# Patient Record
Sex: Female | Born: 1940 | Race: White | Hispanic: No | Smoking: Former smoker
Health system: Southern US, Community
[De-identification: ages and names within clinical notes are randomized; demographics above are authoritative.]

## PROBLEM LIST (undated history)

## (undated) DIAGNOSIS — I10 Essential (primary) hypertension: Secondary | ICD-10-CM

## (undated) DIAGNOSIS — E785 Hyperlipidemia, unspecified: Secondary | ICD-10-CM

## (undated) DIAGNOSIS — E119 Type 2 diabetes mellitus without complications: Secondary | ICD-10-CM

## (undated) DIAGNOSIS — J439 Emphysema, unspecified: Secondary | ICD-10-CM

## (undated) HISTORY — PX: DILATION AND CURETTAGE OF UTERUS: SHX78

## (undated) HISTORY — PX: TONSILLECTOMY: SUR1361

## (undated) HISTORY — DX: Essential (primary) hypertension: I10

## (undated) HISTORY — DX: Type 2 diabetes mellitus without complications: E11.9

## (undated) HISTORY — DX: Hyperlipidemia, unspecified: E78.5

---

## 2006-03-24 ENCOUNTER — Encounter: Admission: RE | Admit: 2006-03-24 | Discharge: 2006-06-22 | Payer: Self-pay | Admitting: Orthopedic Surgery

## 2006-06-26 ENCOUNTER — Encounter: Admission: RE | Admit: 2006-06-26 | Discharge: 2006-08-30 | Payer: Self-pay | Admitting: Orthopedic Surgery

## 2008-05-23 ENCOUNTER — Encounter: Admission: RE | Admit: 2008-05-23 | Discharge: 2008-05-23 | Payer: Self-pay | Admitting: Internal Medicine

## 2009-12-02 ENCOUNTER — Encounter: Admission: RE | Admit: 2009-12-02 | Discharge: 2009-12-02 | Payer: Self-pay | Admitting: Internal Medicine

## 2011-07-01 ENCOUNTER — Other Ambulatory Visit: Payer: Self-pay | Admitting: Internal Medicine

## 2011-07-01 DIAGNOSIS — R0989 Other specified symptoms and signs involving the circulatory and respiratory systems: Secondary | ICD-10-CM

## 2011-07-06 ENCOUNTER — Ambulatory Visit
Admission: RE | Admit: 2011-07-06 | Discharge: 2011-07-06 | Disposition: A | Discharge status: Home / Self Care | Payer: Medicare Other | Source: Ambulatory Visit | Attending: Internal Medicine | Admitting: Internal Medicine

## 2011-07-06 DIAGNOSIS — R0989 Other specified symptoms and signs involving the circulatory and respiratory systems: Secondary | ICD-10-CM

## 2012-09-18 ENCOUNTER — Ambulatory Visit
Admission: RE | Admit: 2012-09-18 | Discharge: 2012-09-18 | Disposition: A | Discharge status: Home / Self Care | Payer: Medicare Other | Source: Ambulatory Visit | Attending: Cardiology | Admitting: Cardiology

## 2012-09-18 ENCOUNTER — Other Ambulatory Visit: Payer: Self-pay | Admitting: Cardiology

## 2012-09-18 DIAGNOSIS — R0602 Shortness of breath: Secondary | ICD-10-CM

## 2012-10-22 ENCOUNTER — Other Ambulatory Visit (HOSPITAL_COMMUNITY)
Admission: RE | Admit: 2012-10-22 | Discharge: 2012-10-22 | Disposition: A | Discharge status: Home / Self Care | Payer: Medicare Other | Source: Ambulatory Visit | Attending: Internal Medicine | Admitting: Internal Medicine

## 2012-10-22 DIAGNOSIS — Z124 Encounter for screening for malignant neoplasm of cervix: Secondary | ICD-10-CM | POA: Insufficient documentation

## 2013-07-10 ENCOUNTER — Encounter: Payer: Self-pay | Admitting: Podiatrist

## 2013-07-10 ENCOUNTER — Ambulatory Visit (INDEPENDENT_AMBULATORY_CARE_PROVIDER_SITE_OTHER): Payer: Medicare Other | Admitting: Podiatrist

## 2013-07-10 VITALS — BP 134/61 | HR 71 | Resp 16

## 2013-07-10 DIAGNOSIS — B351 Tinea unguium: Secondary | ICD-10-CM

## 2013-07-10 DIAGNOSIS — M79609 Pain in unspecified limb: Secondary | ICD-10-CM

## 2013-07-10 NOTE — Progress Notes (Signed)
HPI:  Patient presents today for follow up of foot and nail care. Denies any new complaints today.  Objective:  Patients chart is reviewed.  Neurovascular status unchanged.  Patients nails are thickened, discolored, distrophic, friable and brittle with yellow-brown discoloration. Patient subjectively relates they are painful with shoes and with ambulation.both feet  Assessment:  Symptomatic onychomycosis  Plan:  Discussed treatment options and alternatives.  The symptomatic toenails were debrided through manual an mechanical means without complication.  Return appointment recommended at routine intervals of 3 months   

## 2013-07-10 NOTE — Patient Instructions (Signed)
Diabetes and Foot Care Diabetes may cause you to have problems because of poor blood supply (circulation) to your feet and legs. This may cause the skin on your feet to become thinner, break easier, and heal more slowly. Your skin may become dry, and the skin may peel and crack. You may also have nerve damage in your legs and feet causing decreased feeling in them. You may not notice minor injuries to your feet that could lead to infections or more serious problems. Taking care of your feet is one of the most important things you can do for yourself.  HOME CARE INSTRUCTIONS  Wear shoes at all times, even in the house. Do not go barefoot. Bare feet are easily injured.  Check your feet daily for blisters, cuts, and redness. If you cannot see the bottom of your feet, use a mirror or ask someone for help.  Wash your feet with warm water (do not use hot water) and mild soap. Then pat your feet and the areas between your toes until they are completely dry. Do not soak your feet as this can dry your skin.  Apply a moisturizing lotion or petroleum jelly (that does not contain alcohol and is unscented) to the skin on your feet and to dry, brittle toenails. Do not apply lotion between your toes.  Trim your toenails straight across. Do not dig under them or around the cuticle. File the edges of your nails with an emery board or nail file.  Do not cut corns or calluses or try to remove them with medicine.  Wear clean socks or stockings every day. Make sure they are not too tight. Do not wear knee-high stockings since they may decrease blood flow to your legs.  Wear shoes that fit properly and have enough cushioning. To break in new shoes, wear them for just a few hours a day. This prevents you from injuring your feet. Always look in your shoes before you put them on to be sure there are no objects inside.  Do not cross your legs. This may decrease the blood flow to your feet.  If you find a minor scrape,  cut, or break in the skin on your feet, keep it and the skin around it clean and dry. These areas may be cleansed with mild soap and water. Do not cleanse the area with peroxide, alcohol, or iodine.  When you remove an adhesive bandage, be sure not to damage the skin around it.  If you have a wound, look at it several times a day to make sure it is healing.  Do not use heating pads or hot water bottles. They may burn your skin. If you have lost feeling in your feet or legs, you may not know it is happening until it is too late.  Make sure your health care provider performs a complete foot exam at least annually or more often if you have foot problems. Report any cuts, sores, or bruises to your health care provider immediately. SEEK MEDICAL CARE IF:   You have an injury that is not healing.  You have cuts or breaks in the skin.  You have an ingrown nail.  You notice redness on your legs or feet.  You feel burning or tingling in your legs or feet.  You have pain or cramps in your legs and feet.  Your legs or feet are numb.  Your feet always feel cold. SEEK IMMEDIATE MEDICAL CARE IF:   There is increasing redness,   swelling, or pain in or around a wound.  There is a red line that goes up your leg.  Pus is coming from a wound.  You develop a fever or as directed by your health care provider.  You notice a bad smell coming from an ulcer or wound. Document Released: 08/12/2000 Document Revised: 04/17/2013 Document Reviewed: 01/22/2013 [employer] Patient Information 2014 [employer].  

## 2013-10-09 ENCOUNTER — Ambulatory Visit: Payer: Medicare Other | Admitting: Podiatrist

## 2014-02-14 ENCOUNTER — Ambulatory Visit (INDEPENDENT_AMBULATORY_CARE_PROVIDER_SITE_OTHER): Payer: Medicare Other | Admitting: Podiatrist

## 2014-02-14 ENCOUNTER — Encounter: Payer: Self-pay | Admitting: Podiatrist

## 2014-02-14 VITALS — BP 110/49 | HR 83 | Resp 18

## 2014-02-14 DIAGNOSIS — M79609 Pain in unspecified limb: Secondary | ICD-10-CM

## 2014-02-14 DIAGNOSIS — M79673 Pain in unspecified foot: Secondary | ICD-10-CM

## 2014-02-14 DIAGNOSIS — B351 Tinea unguium: Secondary | ICD-10-CM

## 2014-02-14 NOTE — Progress Notes (Signed)
Just need my toenails trimmed up  HPI: Patient presents today for follow up of foot and nail care. Denies any new complaints today.  Objective: Patients chart is reviewed. Neurovascular status unchanged. Patients nails are thickened, discolored, distrophic, friable and brittle with yellow-brown discoloration. Patient subjectively relates they are painful with shoes and with ambulation.both feet  Assessment: Symptomatic onychomycosis  Plan: Discussed treatment options and alternatives. The symptomatic toenails were debrided through manual an mechanical means without complication. Return appointment recommended at routine intervals of 3 months

## 2014-05-22 ENCOUNTER — Ambulatory Visit (INDEPENDENT_AMBULATORY_CARE_PROVIDER_SITE_OTHER): Payer: Medicare Other | Admitting: Podiatrist

## 2014-05-22 ENCOUNTER — Encounter: Payer: Self-pay | Admitting: Podiatrist

## 2014-05-22 DIAGNOSIS — M79609 Pain in unspecified limb: Secondary | ICD-10-CM

## 2014-05-22 DIAGNOSIS — M79676 Pain in unspecified toe(s): Principal | ICD-10-CM

## 2014-05-22 DIAGNOSIS — B351 Tinea unguium: Secondary | ICD-10-CM

## 2014-05-26 NOTE — Progress Notes (Signed)
Just need my toenails trimmed up  HPI: Patient presents today for follow up of foot and nail care. Denies any new complaints today.  Objective: Patients chart is reviewed. Neurovascular status unchanged. Patients nails are thickened, discolored, distrophic, friable and brittle with yellow-brown discoloration. Patient subjectively relates they are painful with shoes and with ambulation.both feet  Assessment: Symptomatic onychomycosis  Plan: Discussed treatment options and alternatives. The symptomatic toenails were debrided through manual an mechanical means without complication. Return appointment recommended at routine intervals of 3 months   

## 2014-11-19 ENCOUNTER — Ambulatory Visit (INDEPENDENT_AMBULATORY_CARE_PROVIDER_SITE_OTHER): Payer: Medicare Other | Admitting: Podiatry

## 2014-11-19 ENCOUNTER — Encounter: Payer: Self-pay | Admitting: Podiatry

## 2014-11-19 VITALS — BP 129/57 | HR 74 | Resp 18

## 2014-11-19 DIAGNOSIS — M79676 Pain in unspecified toe(s): Secondary | ICD-10-CM | POA: Diagnosis not present

## 2014-11-19 DIAGNOSIS — B351 Tinea unguium: Secondary | ICD-10-CM | POA: Diagnosis not present

## 2014-11-19 DIAGNOSIS — L84 Corns and callosities: Secondary | ICD-10-CM

## 2014-11-19 NOTE — Progress Notes (Signed)
Patient ID: Isaac LaudBetty H Aikman, female   DOB: Oct 25, 1940, 74 y.o.   MRN: [id number]  Subjective: 74 y.o.-year-old female returns the office today for painful, elongated, thickened toenails and calluses for which she is unable to trim herself. Denies any redness or drainage around the nails. Denies any acute changes since last appointment and no new complaints today. Denies any systemic complaints such as fevers, chills, nausea, vomiting.   Objective: AAO 3, NAD DP/PT pulses palpable, CRT less than 3 seconds Protective sensation intact with Simms Weinstein monofilament, Achilles tendon reflex intact.  Nails hypertrophic, dystrophic, elongated, brittle, discolored 10. There is tenderness overlying the nails 1-5 bilaterally. There is no surrounding erythema or drainage along the nail sites. Bilateral submetatarsal 2 hyperkeratotic lesions. Upon debridement there is no underlying ulceration, drainage, infection. Underlying skin intact. No open lesions or other pre-ulcerative lesions are identified. No other areas of tenderness bilateral lower extremities. No overlying edema, erythema, increased warmth. No pain with calf compression, swelling, warmth, erythema.  Assessment: Patient presents with symptomatic onychomycosis, hyperkeratotic lesions 2  Plan: -Treatment options including alternatives, risks, complications were discussed -Nails sharply debrided 10 without complication/bleeding. -Hyperkeratotic lesions 2 sharply debrided without complication/bleeding. -Discussed daily foot inspection. If there are any changes, to call the office immediately.  -Follow-up in 3 months or sooner if any problems are to arise. In the meantime, encouraged to call the office with any questions, concerns, changes symptoms.

## 2015-02-23 ENCOUNTER — Encounter: Payer: Self-pay | Admitting: Podiatry

## 2015-02-23 ENCOUNTER — Ambulatory Visit (INDEPENDENT_AMBULATORY_CARE_PROVIDER_SITE_OTHER): Payer: Medicare Other | Admitting: Podiatry

## 2015-02-23 DIAGNOSIS — M79676 Pain in unspecified toe(s): Secondary | ICD-10-CM

## 2015-02-23 DIAGNOSIS — B351 Tinea unguium: Secondary | ICD-10-CM | POA: Diagnosis not present

## 2015-02-23 DIAGNOSIS — L84 Corns and callosities: Secondary | ICD-10-CM

## 2015-02-23 NOTE — Progress Notes (Signed)
Patient ID: Beth LaudBetty H Radermacher, female   DOB: 21-May-1941, 74 y.o.   MRN: [id number]  Subjective: 74 y.o. female returns the office today for painful, elongated, thickened toenails and calluses for which she is unable to trim herself. Denies any redness or drainage around the nails/calluses. Denies any acute changes since last appointment and no new complaints today. Denies any systemic complaints such as fevers, chills, nausea, vomiting.   Objective: AAO 3, NAD DP/PT pulses palpable, CRT less than 3 seconds Protective sensation intact with Simms Weinstein monofilament  Nails hypertrophic, dystrophic, elongated, brittle, discolored 10. There is tenderness overlying the nails 1-5 bilaterally. There is no surrounding erythema or drainage along the nail sites. Bilateral submetatarsal 2 hyperkeratotic lesions. Upon debridement there is no underlying ulceration, drainage, infection. Underlying skin intact. No open lesions or other pre-ulcerative lesions are identified. No other areas of tenderness bilateral lower extremities. No overlying edema, erythema, increased warmth. No pain with calf compression, swelling, warmth, erythema.  Assessment: Patient presents with symptomatic onychomycosis, hyperkeratotic lesions 2  Plan: -Treatment options including alternatives, risks, complications were discussed -Nails sharply debrided 10 without complication/bleeding. -Hyperkeratotic lesions 2 sharply debrided without complication/bleeding. -Discussed daily foot inspection. If there are any changes, to call the office immediately.  -Follow-up in 3 months or sooner if any problems are to arise. In the meantime, encouraged to call the office with any questions, concerns, changes symptoms.  Ovid CurdMatthew Jalayia Bagheri, DPM

## 2015-02-25 ENCOUNTER — Ambulatory Visit: Payer: Medicare Other | Admitting: Podiatry

## 2015-06-11 ENCOUNTER — Encounter: Payer: Self-pay | Admitting: Podiatry

## 2015-06-11 ENCOUNTER — Ambulatory Visit (INDEPENDENT_AMBULATORY_CARE_PROVIDER_SITE_OTHER): Payer: Medicare Other | Admitting: Podiatry

## 2015-06-11 DIAGNOSIS — B351 Tinea unguium: Secondary | ICD-10-CM | POA: Diagnosis not present

## 2015-06-11 DIAGNOSIS — M79676 Pain in unspecified toe(s): Secondary | ICD-10-CM

## 2015-06-11 NOTE — Progress Notes (Signed)
Patient ID: Beth Kerr, female   DOB: 02-24-1941, 74 y.o.   MRN: [id number] Complaint:  Visit Type: Patient returns to my office for continued preventative foot care services. Complaint: Patient states" my nails have grown long and thick and become painful to walk and wear shoes" Patient has been diagnosed with DM with no foot complications. The patient presents for preventative foot care services. No changes to ROS  Podiatric Exam: Vascular: dorsalis pedis and posterior tibial pulses are palpable bilateral. Capillary return is immediate. Temperature gradient is WNL. Skin turgor WNL  Sensorium: Normal Semmes Weinstein monofilament test. Normal tactile sensation bilaterally. Nail Exam: Pt has thick disfigured discolored nails with subungual debris noted bilateral entire nail hallux through fifth toenails Ulcer Exam: There is no evidence of ulcer or pre-ulcerative changes or infection. Orthopedic Exam: Muscle tone and strength are WNL. No limitations in general ROM. No crepitus or effusions noted. Foot type and digits show no abnormalities. Bony prominences are unremarkable. Skin: No Porokeratosis. No infection or ulcers  Diagnosis:  Onychomycosis, , Pain in right toe, pain in left toes  Treatment & Plan Procedures and Treatment: Consent by patient was obtained for treatment procedures. The patient understood the discussion of treatment and procedures well. All questions were answered thoroughly reviewed. Debridement of mycotic and hypertrophic toenails, 1 through 5 bilateral and clearing of subungual debris. No ulceration, no infection noted.  Return Visit-Office Procedure: Patient instructed to return to the office for a follow up visit 3 months for continued evaluation and treatment.

## 2015-09-11 ENCOUNTER — Ambulatory Visit: Payer: Medicare Other | Admitting: Podiatry

## 2015-09-16 ENCOUNTER — Ambulatory Visit (INDEPENDENT_AMBULATORY_CARE_PROVIDER_SITE_OTHER): Payer: Medicare Other | Admitting: Podiatry

## 2015-09-16 ENCOUNTER — Encounter: Payer: Self-pay | Admitting: Podiatry

## 2015-09-16 DIAGNOSIS — M79676 Pain in unspecified toe(s): Secondary | ICD-10-CM

## 2015-09-16 DIAGNOSIS — B351 Tinea unguium: Secondary | ICD-10-CM | POA: Diagnosis not present

## 2015-09-16 NOTE — Progress Notes (Signed)
Patient ID: Seara H Forand, female   DOB: 04/04/1941, 74 y.o.   MRN: [id number] Complaint:  Visit Type: Patient returns to my office for continued preventative foot care services. Complaint: Patient states" my nails have grown long and thick and become painful to walk and wear shoes" Patient has been diagnosed with DM with no foot complications. The patient presents for preventative foot care services. No changes to ROS  Podiatric Exam: Vascular: dorsalis pedis and posterior tibial pulses are palpable bilateral. Capillary return is immediate. Temperature gradient is WNL. Skin turgor WNL  Sensorium: Normal Semmes Weinstein monofilament test. Normal tactile sensation bilaterally. Nail Exam: Pt has thick disfigured discolored nails with subungual debris noted bilateral entire nail hallux through fifth toenails Ulcer Exam: There is no evidence of ulcer or pre-ulcerative changes or infection. Orthopedic Exam: Muscle tone and strength are WNL. No limitations in general ROM. No crepitus or effusions noted. Foot type and digits show no abnormalities. Bony prominences are unremarkable. Skin: No Porokeratosis. No infection or ulcers  Diagnosis:  Onychomycosis, , Pain in right toe, pain in left toes  Treatment & Plan Procedures and Treatment: Consent by patient was obtained for treatment procedures. The patient understood the discussion of treatment and procedures well. All questions were answered thoroughly reviewed. Debridement of mycotic and hypertrophic toenails, 1 through 5 bilateral and clearing of subungual debris. No ulceration, no infection noted.  Return Visit-Office Procedure: Patient instructed to return to the office for a follow up visit 3 months for continued evaluation and treatment.   Kiwana Deblasi DPM 

## 2015-09-30 DIAGNOSIS — F329 Major depressive disorder, single episode, unspecified: Secondary | ICD-10-CM | POA: Diagnosis not present

## 2015-09-30 DIAGNOSIS — I10 Essential (primary) hypertension: Secondary | ICD-10-CM | POA: Diagnosis not present

## 2015-09-30 DIAGNOSIS — E119 Type 2 diabetes mellitus without complications: Secondary | ICD-10-CM | POA: Diagnosis not present

## 2015-09-30 DIAGNOSIS — E7800 Pure hypercholesterolemia, unspecified: Secondary | ICD-10-CM | POA: Diagnosis not present

## 2015-10-05 DIAGNOSIS — J329 Chronic sinusitis, unspecified: Secondary | ICD-10-CM | POA: Diagnosis not present

## 2015-10-05 DIAGNOSIS — R05 Cough: Secondary | ICD-10-CM | POA: Diagnosis not present

## 2015-12-09 ENCOUNTER — Ambulatory Visit (INDEPENDENT_AMBULATORY_CARE_PROVIDER_SITE_OTHER): Payer: Medicare Other | Admitting: Podiatry

## 2015-12-09 ENCOUNTER — Encounter: Payer: Self-pay | Admitting: Podiatry

## 2015-12-09 DIAGNOSIS — B351 Tinea unguium: Secondary | ICD-10-CM | POA: Diagnosis not present

## 2015-12-09 DIAGNOSIS — M79676 Pain in unspecified toe(s): Secondary | ICD-10-CM | POA: Diagnosis not present

## 2015-12-09 NOTE — Progress Notes (Signed)
Patient ID: Beth Kerr, female   DOB: 10/07/1940, 74 y.o.   MRN: [id number] Complaint:  Visit Type: Patient returns to my office for continued preventative foot care services. Complaint: Patient states" my nails have grown long and thick and become painful to walk and wear shoes" Patient has been diagnosed with DM with no foot complications. The patient presents for preventative foot care services. No changes to ROS  Podiatric Exam: Vascular: dorsalis pedis and posterior tibial pulses are palpable bilateral. Capillary return is immediate. Temperature gradient is WNL. Skin turgor WNL  Sensorium: Normal Semmes Weinstein monofilament test. Normal tactile sensation bilaterally. Nail Exam: Pt has thick disfigured discolored nails with subungual debris noted bilateral entire nail hallux through fifth toenails Ulcer Exam: There is no evidence of ulcer or pre-ulcerative changes or infection. Orthopedic Exam: Muscle tone and strength are WNL. No limitations in general ROM. No crepitus or effusions noted. Foot type and digits show no abnormalities. Bony prominences are unremarkable. Skin: No Porokeratosis. No infection or ulcers  Diagnosis:  Onychomycosis, , Pain in right toe, pain in left toes  Treatment & Plan Procedures and Treatment: Consent by patient was obtained for treatment procedures. The patient understood the discussion of treatment and procedures well. All questions were answered thoroughly reviewed. Debridement of mycotic and hypertrophic toenails, 1 through 5 bilateral and clearing of subungual debris. No ulceration, no infection noted.  Return Visit-Office Procedure: Patient instructed to return to the office for a follow up visit 3 months for continued evaluation and treatment.   Tish Begin DPM 

## 2016-01-12 DIAGNOSIS — H4421 Degenerative myopia, right eye: Secondary | ICD-10-CM | POA: Diagnosis not present

## 2016-01-12 DIAGNOSIS — E119 Type 2 diabetes mellitus without complications: Secondary | ICD-10-CM | POA: Diagnosis not present

## 2016-01-12 DIAGNOSIS — H4422 Degenerative myopia, left eye: Secondary | ICD-10-CM | POA: Diagnosis not present

## 2016-01-12 DIAGNOSIS — H43813 Vitreous degeneration, bilateral: Secondary | ICD-10-CM | POA: Diagnosis not present

## 2016-01-12 DIAGNOSIS — H40009 Preglaucoma, unspecified, unspecified eye: Secondary | ICD-10-CM | POA: Diagnosis not present

## 2016-03-08 DIAGNOSIS — I6523 Occlusion and stenosis of bilateral carotid arteries: Secondary | ICD-10-CM | POA: Diagnosis not present

## 2016-03-09 ENCOUNTER — Ambulatory Visit: Payer: Medicare Other | Admitting: Podiatry

## 2016-03-18 ENCOUNTER — Ambulatory Visit (INDEPENDENT_AMBULATORY_CARE_PROVIDER_SITE_OTHER): Payer: Medicare Other | Admitting: Podiatry

## 2016-03-18 ENCOUNTER — Encounter: Payer: Self-pay | Admitting: Podiatry

## 2016-03-18 DIAGNOSIS — B351 Tinea unguium: Secondary | ICD-10-CM

## 2016-03-18 DIAGNOSIS — M79676 Pain in unspecified toe(s): Secondary | ICD-10-CM

## 2016-03-18 NOTE — Progress Notes (Signed)
Patient ID: Beth Kerr, female   DOB: August 29, 1941, 75 y.o.   MRN: [id number] Complaint:  Visit Type: Patient returns to my office for continued preventative foot care services. Complaint: Patient states" my nails have grown long and thick and become painful to walk and wear shoes" Patient has been diagnosed with DM with no foot complications. The patient presents for preventative foot care services. No changes to ROS  Podiatric Exam: Vascular: dorsalis pedis and posterior tibial pulses are palpable bilateral. Capillary return is immediate. Temperature gradient is WNL. Skin turgor WNL  Sensorium: Normal Semmes Weinstein monofilament test. Normal tactile sensation bilaterally. Nail Exam: Pt has thick disfigured discolored nails with subungual debris noted bilateral entire nail hallux through fifth toenails Ulcer Exam: There is no evidence of ulcer or pre-ulcerative changes or infection. Orthopedic Exam: Muscle tone and strength are WNL. No limitations in general ROM. No crepitus or effusions noted. Foot type and digits show no abnormalities. Bony prominences are unremarkable. Skin: No Porokeratosis. No infection or ulcers  Diagnosis:  Onychomycosis, , Pain in right toe, pain in left toes  Treatment & Plan Procedures and Treatment: Consent by patient was obtained for treatment procedures. The patient understood the discussion of treatment and procedures well. All questions were answered thoroughly reviewed. Debridement of mycotic and hypertrophic toenails, 1 through 5 bilateral and clearing of subungual debris. No ulceration, no infection noted.  Return Visit-Office Procedure: Patient instructed to return to the office for a follow up visit 3 months for continued evaluation and treatment.   Helane GuntherGregory Amoni Morales DPM

## 2016-04-01 DIAGNOSIS — E119 Type 2 diabetes mellitus without complications: Secondary | ICD-10-CM | POA: Diagnosis not present

## 2016-04-01 DIAGNOSIS — M79641 Pain in right hand: Secondary | ICD-10-CM | POA: Diagnosis not present

## 2016-04-01 DIAGNOSIS — E785 Hyperlipidemia, unspecified: Secondary | ICD-10-CM | POA: Diagnosis not present

## 2016-04-01 DIAGNOSIS — I10 Essential (primary) hypertension: Secondary | ICD-10-CM | POA: Diagnosis not present

## 2016-04-01 DIAGNOSIS — M79642 Pain in left hand: Secondary | ICD-10-CM | POA: Diagnosis not present

## 2016-04-01 DIAGNOSIS — Z0001 Encounter for general adult medical examination with abnormal findings: Secondary | ICD-10-CM | POA: Diagnosis not present

## 2016-04-01 DIAGNOSIS — F329 Major depressive disorder, single episode, unspecified: Secondary | ICD-10-CM | POA: Diagnosis not present

## 2016-04-01 DIAGNOSIS — M810 Age-related osteoporosis without current pathological fracture: Secondary | ICD-10-CM | POA: Diagnosis not present

## 2016-05-12 DIAGNOSIS — J029 Acute pharyngitis, unspecified: Secondary | ICD-10-CM | POA: Diagnosis not present

## 2016-05-12 DIAGNOSIS — J069 Acute upper respiratory infection, unspecified: Secondary | ICD-10-CM | POA: Diagnosis not present

## 2016-05-12 DIAGNOSIS — H9203 Otalgia, bilateral: Secondary | ICD-10-CM | POA: Diagnosis not present

## 2016-05-12 DIAGNOSIS — Z23 Encounter for immunization: Secondary | ICD-10-CM | POA: Diagnosis not present

## 2016-05-19 ENCOUNTER — Emergency Department (HOSPITAL_COMMUNITY)
Admission: EM | Admit: 2016-05-19 | Discharge: 2016-05-19 | Disposition: A | Discharge status: Home / Self Care | Payer: Worker's Compensation | Attending: Emergency Medicine | Admitting: Emergency Medicine

## 2016-05-19 ENCOUNTER — Encounter (HOSPITAL_COMMUNITY): Payer: Self-pay

## 2016-05-19 ENCOUNTER — Emergency Department (HOSPITAL_COMMUNITY): Payer: Worker's Compensation

## 2016-05-19 DIAGNOSIS — Y929 Unspecified place or not applicable: Secondary | ICD-10-CM | POA: Insufficient documentation

## 2016-05-19 DIAGNOSIS — Z7984 Long term (current) use of oral hypoglycemic drugs: Secondary | ICD-10-CM | POA: Diagnosis not present

## 2016-05-19 DIAGNOSIS — S7001XA Contusion of right hip, initial encounter: Secondary | ICD-10-CM | POA: Diagnosis not present

## 2016-05-19 DIAGNOSIS — S93401A Sprain of unspecified ligament of right ankle, initial encounter: Secondary | ICD-10-CM | POA: Diagnosis not present

## 2016-05-19 DIAGNOSIS — E119 Type 2 diabetes mellitus without complications: Secondary | ICD-10-CM | POA: Insufficient documentation

## 2016-05-19 DIAGNOSIS — Z79899 Other long term (current) drug therapy: Secondary | ICD-10-CM | POA: Diagnosis not present

## 2016-05-19 DIAGNOSIS — Y939 Activity, unspecified: Secondary | ICD-10-CM | POA: Diagnosis not present

## 2016-05-19 DIAGNOSIS — W010XXA Fall on same level from slipping, tripping and stumbling without subsequent striking against object, initial encounter: Secondary | ICD-10-CM | POA: Diagnosis not present

## 2016-05-19 DIAGNOSIS — I10 Essential (primary) hypertension: Secondary | ICD-10-CM | POA: Diagnosis not present

## 2016-05-19 DIAGNOSIS — S99911A Unspecified injury of right ankle, initial encounter: Secondary | ICD-10-CM | POA: Diagnosis present

## 2016-05-19 DIAGNOSIS — S40021A Contusion of right upper arm, initial encounter: Secondary | ICD-10-CM

## 2016-05-19 DIAGNOSIS — Z87891 Personal history of nicotine dependence: Secondary | ICD-10-CM | POA: Insufficient documentation

## 2016-05-19 DIAGNOSIS — Y990 Civilian activity done for income or pay: Secondary | ICD-10-CM | POA: Insufficient documentation

## 2016-05-19 DIAGNOSIS — Z7982 Long term (current) use of aspirin: Secondary | ICD-10-CM | POA: Diagnosis not present

## 2016-05-19 DIAGNOSIS — W19XXXA Unspecified fall, initial encounter: Secondary | ICD-10-CM

## 2016-05-19 DIAGNOSIS — S80212A Abrasion, left knee, initial encounter: Secondary | ICD-10-CM | POA: Diagnosis not present

## 2016-05-19 HISTORY — DX: Emphysema, unspecified: J43.9

## 2016-05-19 NOTE — ED Notes (Signed)
DC instructions given by Gabriel RungAdrian RN

## 2016-05-19 NOTE — ED Notes (Signed)
Pt assessed and discharged by Roselyn BeringJ. Knapp EDP.  Pt was waiting for their d/c instruction in Triage area.  Pt did not sign

## 2016-05-19 NOTE — Discharge Instructions (Signed)
Apply ice to tender areas, tylenol or advil as needed for pain, follow up with a primary care doctor, orthopedic doctor, or occupational health doctor if sx are persisting into next week

## 2016-05-19 NOTE — ED Provider Notes (Signed)
[facility]-EMERGENCY DEPT Provider Note   CSN: [id number] Arrival date & time: 05/19/16  1106     History   Chief Complaint Chief Complaint  Patient presents with  . Fall  . Ankle Pain  . Axilla pain  . Knee Pain    HPI Beth Kerr is a 75 y.o. female.  HPI Pt was at work this am.  She tripped over a step stool on the floor and fell to the ground.  Pt is having soreness in the right axillary area.  She was also having some pain in her right foot, ankle area and right hip.  SHe has been able to walk.  No LOC.  No other complaints.  Past Medical History:  Diagnosis Date  . Diabetes mellitus without complication (HCC)   . Emphysema/COPD (HCC)   . Hyperlipidemia   . Hypertension     There are no active problems to display for this patient.   Past Surgical History:  Procedure Laterality Date  . DILATION AND CURETTAGE OF UTERUS    . TONSILLECTOMY      OB History    No data available       Home Medications    Prior to Admission medications   Medication Sig Start Date End Date Taking? Authorizing Provider  aspirin EC 81 MG tablet Take 81 mg by mouth every morning.    Yes Historical Provider, MD  CRESTOR 10 MG tablet Take 10 mg by mouth every evening.  09/16/14  Yes Historical Provider, MD  losartan-hydrochlorothiazide (HYZAAR) 100-12.5 MG per tablet Take 1 tablet by mouth every morning.  11/12/14  Yes Historical Provider, MD  metFORMIN (GLUCOPHAGE) 1000 MG tablet Take 1,000 mg by mouth 2 (two) times daily with a meal.  10/18/14  Yes Historical Provider, MD    Family History History reviewed. No pertinent family history.  Social History Social History  Substance Use Topics  . Smoking status: Former Games developer  . Smokeless tobacco: Never Used  . Alcohol use No     Allergies   Demerol [meperidine]   Review of Systems Review of Systems   Physical Exam Updated Vital Signs BP (!) 149/51 (BP Location: Left Arm)   Pulse 80   Temp 98.4 F (36.9 C) (Oral)    Resp 15   SpO2 90%   Physical Exam  Constitutional: She appears well-developed and well-nourished. No distress.  HENT:  Head: Normocephalic and atraumatic.  Right Ear: External ear normal.  Left Ear: External ear normal.  Eyes: Conjunctivae are normal. Right eye exhibits no discharge. Left eye exhibits no discharge. No scleral icterus.  Neck: Neck supple. No tracheal deviation present.  Cardiovascular: Normal rate.   Pulmonary/Chest: Effort normal. No stridor. No respiratory distress.  Abdominal: She exhibits no distension.  Musculoskeletal: She exhibits no edema.       Right shoulder: She exhibits no tenderness, no bony tenderness and no swelling.       Left shoulder: She exhibits no tenderness, no bony tenderness and no swelling.       Right wrist: She exhibits no tenderness, no bony tenderness and no swelling.       Left wrist: She exhibits no tenderness, no bony tenderness and no swelling.       Right hip: She exhibits normal range of motion, no tenderness, no bony tenderness and no swelling.       Left hip: She exhibits normal range of motion, no tenderness and no bony tenderness.       Left  knee: She exhibits no swelling (small abrasion), no effusion, no deformity and no laceration.       Right ankle: She exhibits normal range of motion, no swelling and no deformity. Tenderness (mild). No lateral malleolus and no medial malleolus tenderness found.       Left ankle: She exhibits no swelling. No tenderness.       Cervical back: She exhibits no tenderness, no bony tenderness and no swelling.       Thoracic back: She exhibits no tenderness, no bony tenderness and no swelling.       Lumbar back: She exhibits no tenderness, no bony tenderness and no swelling.  Neurological: She is alert. Cranial nerve deficit: no gross deficits.  Skin: Skin is warm and dry. No rash noted.  Psychiatric: She has a normal mood and affect.  Nursing note and vitals reviewed.    ED Treatments / Results    Labs (all labs ordered are listed, but only abnormal results are displayed) Labs Reviewed - No data to display  Radiology Dg Shoulder Right  Result Date: 05/19/2016 CLINICAL DATA:  Recent fall with right shoulder pain in the anterior aspect and axilla region. EXAM: RIGHT SHOULDER - 2+ VIEW COMPARISON:  Chest radiograph 09/18/2012 FINDINGS: Right shoulder is located without fracture. Right AC joint is intact. Visualized right ribs are intact. IMPRESSION: No acute abnormality. Electronically Signed   By: Richarda OverlieAdam  Henn M.D.   On: 05/19/2016 12:31   Dg Ankle Complete Right  Result Date: 05/19/2016 CLINICAL DATA:  Fall today with ankle pain, initial encounter EXAM: RIGHT ANKLE - COMPLETE 3+ VIEW COMPARISON:  None. FINDINGS: There is no evidence of fracture, dislocation, or joint effusion. There is no evidence of arthropathy or other focal bone abnormality. Soft tissues are unremarkable. IMPRESSION: No acute abnormality noted. Electronically Signed   By: Alcide CleverMark  Lukens M.D.   On: 05/19/2016 12:31   Dg Knee Complete 4 Views Left  Result Date: 05/19/2016 CLINICAL DATA:  Fall today with left knee pain, initial encounter EXAM: LEFT KNEE - COMPLETE 4+ VIEW COMPARISON:  None. FINDINGS: Mild patellofemoral degenerative changes are noted. No joint effusion is seen. No acute fracture or dislocation is noted. No soft tissue abnormality is seen. IMPRESSION: No acute abnormality noted. Mild patellofemoral degenerative change. Electronically Signed   By: Alcide CleverMark  Lukens M.D.   On: 05/19/2016 12:31   Dg Hip Unilat  With Pelvis 2-3 Views Right  Result Date: 05/19/2016 CLINICAL DATA:  Recent fall today with right hip pain, initial encounter EXAM: DG HIP (WITH OR WITHOUT PELVIS) 2-3V RIGHT COMPARISON:  None. FINDINGS: The pelvic ring is intact. The proximal femur is within normal limits. No fracture or dislocation is seen. No soft tissue abnormality is noted. IMPRESSION: No acute abnormality seen. Electronically Signed   By:  Alcide CleverMark  Lukens M.D.   On: 05/19/2016 12:32    Procedures Procedures (including critical care time)  Medications Ordered in ED Medications - No data to display   Initial Impression / Assessment and Plan / ED Course  I have reviewed the triage vital signs and the nursing notes.  Pertinent labs & imaging results that were available during my care of the patient were reviewed by me and considered in my medical decision making (see chart for details).  Clinical Course    Xrays negative.  No fx or dislocation.  Dc home.  OTC pani meds prn.  Follow  Up with ortho, or occ health as needed  Final Clinical Impressions(s) / ED Diagnoses  Final diagnoses:  Fall, initial encounter  Ankle sprain, right, initial encounter  Contusion of right hip, initial encounter  Contusion of axillary region, right, initial encounter    New Prescriptions New Prescriptions   No medications on file     Linwood Dibbles, MD 05/19/16 1610

## 2016-05-19 NOTE — ED Triage Notes (Signed)
Pt c/o L knee, R ankle, R hip. and R axilla pain after falling off a step stool at work this morning.  Pain score 2/10.  Denies LOC.  Pt was ambulatory after fall.

## 2016-06-24 ENCOUNTER — Encounter: Payer: Self-pay | Admitting: Podiatry

## 2016-06-24 ENCOUNTER — Ambulatory Visit (INDEPENDENT_AMBULATORY_CARE_PROVIDER_SITE_OTHER): Payer: Medicare Other | Admitting: Podiatry

## 2016-06-24 DIAGNOSIS — M79676 Pain in unspecified toe(s): Secondary | ICD-10-CM

## 2016-06-24 DIAGNOSIS — B351 Tinea unguium: Secondary | ICD-10-CM

## 2016-06-24 NOTE — Progress Notes (Signed)
Patient ID: Beth Kerr, female   DOB: 08/29/1941, 74 y.o.   MRN: [id number] Complaint:  Visit Type: Patient returns to my office for continued preventative foot care services. Complaint: Patient states" my nails have grown long and thick and become painful to walk and wear shoes" Patient has been diagnosed with DM with no foot complications. The patient presents for preventative foot care services. No changes to ROS  Podiatric Exam: Vascular: dorsalis pedis and posterior tibial pulses are palpable bilateral. Capillary return is immediate. Temperature gradient is WNL. Skin turgor WNL  Sensorium: Normal Semmes Weinstein monofilament test. Normal tactile sensation bilaterally. Nail Exam: Pt has thick disfigured discolored nails with subungual debris noted bilateral entire nail hallux through fifth toenails Ulcer Exam: There is no evidence of ulcer or pre-ulcerative changes or infection. Orthopedic Exam: Muscle tone and strength are WNL. No limitations in general ROM. No crepitus or effusions noted. Foot type and digits show no abnormalities. Bony prominences are unremarkable. Skin: No Porokeratosis. No infection or ulcers  Diagnosis:  Onychomycosis, , Pain in right toe, pain in left toes  Treatment & Plan Procedures and Treatment: Consent by patient was obtained for treatment procedures. The patient understood the discussion of treatment and procedures well. All questions were answered thoroughly reviewed. Debridement of mycotic and hypertrophic toenails, 1 through 5 bilateral and clearing of subungual debris. No ulceration, no infection noted.  Return Visit-Office Procedure: Patient instructed to return to the office for a follow up visit 3 months for continued evaluation and treatment.   Betzabeth Derringer DPM 

## 2016-06-27 DIAGNOSIS — R0600 Dyspnea, unspecified: Secondary | ICD-10-CM | POA: Diagnosis not present

## 2016-06-28 ENCOUNTER — Other Ambulatory Visit (HOSPITAL_COMMUNITY): Payer: Self-pay | Admitting: Respiratory Therapy

## 2016-06-28 DIAGNOSIS — R0600 Dyspnea, unspecified: Secondary | ICD-10-CM

## 2016-06-30 ENCOUNTER — Ambulatory Visit (HOSPITAL_COMMUNITY)
Admission: RE | Admit: 2016-06-30 | Discharge: 2016-06-30 | Disposition: A | Discharge status: Home / Self Care | Payer: Medicare Other | Source: Ambulatory Visit | Attending: Internal Medicine | Admitting: Internal Medicine

## 2016-06-30 DIAGNOSIS — R0600 Dyspnea, unspecified: Secondary | ICD-10-CM | POA: Insufficient documentation

## 2016-06-30 LAB — PULMONARY FUNCTION TEST
DL/VA % pred: 81 %
DL/VA: 3.57 ml/min/mmHg/L
DLCO UNC % PRED: 62 %
DLCO UNC: 12.64 ml/min/mmHg
FEF 25-75 PRE: 0.24 L/s
FEF 25-75 Post: 0.32 L/sec
FEF2575-%Change-Post: 29 %
FEF2575-%PRED-POST: 22 %
FEF2575-%Pred-Pre: 17 %
FEV1-%Change-Post: 5 %
FEV1-%PRED-POST: 45 %
FEV1-%Pred-Pre: 43 %
FEV1-POST: 0.81 L
FEV1-Pre: 0.77 L
FEV1FVC-%Change-Post: -8 %
FEV1FVC-%Pred-Pre: 64 %
FEV6-%CHANGE-POST: 11 %
FEV6-%PRED-POST: 69 %
FEV6-%Pred-Pre: 62 %
FEV6-PRE: 1.41 L
FEV6-Post: 1.56 L
FEV6FVC-%CHANGE-POST: -2 %
FEV6FVC-%PRED-PRE: 91 %
FEV6FVC-%Pred-Post: 89 %
FVC-%Change-Post: 14 %
FVC-%PRED-POST: 77 %
FVC-%Pred-Pre: 67 %
FVC-Post: 1.84 L
FVC-Pre: 1.61 L
POST FEV1/FVC RATIO: 44 %
Post FEV6/FVC ratio: 85 %
Pre FEV1/FVC ratio: 48 %
Pre FEV6/FVC Ratio: 87 %
RV % PRED: 155 %
RV: 3.38 L
TLC % pred: 118 %
TLC: 5.43 L

## 2016-06-30 MED ORDER — ALBUTEROL SULFATE (2.5 MG/3ML) 0.083% IN NEBU
2.5000 mg | INHALATION_SOLUTION | Freq: Once | RESPIRATORY_TRACT | Status: AC
  Administered 2016-06-30: 2.5 mg via RESPIRATORY_TRACT

## 2016-09-06 DIAGNOSIS — I6523 Occlusion and stenosis of bilateral carotid arteries: Secondary | ICD-10-CM | POA: Diagnosis not present

## 2016-09-23 ENCOUNTER — Ambulatory Visit: Payer: Medicare Other | Admitting: Podiatry

## 2016-09-28 DIAGNOSIS — E119 Type 2 diabetes mellitus without complications: Secondary | ICD-10-CM | POA: Diagnosis not present

## 2016-09-28 DIAGNOSIS — I10 Essential (primary) hypertension: Secondary | ICD-10-CM | POA: Diagnosis not present

## 2016-10-03 DIAGNOSIS — Z0000 Encounter for general adult medical examination without abnormal findings: Secondary | ICD-10-CM | POA: Diagnosis not present

## 2016-10-03 DIAGNOSIS — I10 Essential (primary) hypertension: Secondary | ICD-10-CM | POA: Diagnosis not present

## 2016-10-03 DIAGNOSIS — E119 Type 2 diabetes mellitus without complications: Secondary | ICD-10-CM | POA: Diagnosis not present

## 2016-10-03 DIAGNOSIS — E785 Hyperlipidemia, unspecified: Secondary | ICD-10-CM | POA: Diagnosis not present

## 2016-10-03 DIAGNOSIS — I6529 Occlusion and stenosis of unspecified carotid artery: Secondary | ICD-10-CM | POA: Diagnosis not present

## 2016-10-11 DIAGNOSIS — I6523 Occlusion and stenosis of bilateral carotid arteries: Secondary | ICD-10-CM | POA: Diagnosis not present

## 2016-10-11 DIAGNOSIS — I10 Essential (primary) hypertension: Secondary | ICD-10-CM | POA: Diagnosis not present

## 2016-10-11 DIAGNOSIS — E119 Type 2 diabetes mellitus without complications: Secondary | ICD-10-CM | POA: Diagnosis not present

## 2016-10-11 DIAGNOSIS — E7800 Pure hypercholesterolemia, unspecified: Secondary | ICD-10-CM | POA: Diagnosis not present

## 2016-10-25 DIAGNOSIS — I10 Essential (primary) hypertension: Secondary | ICD-10-CM | POA: Diagnosis not present

## 2016-11-22 ENCOUNTER — Ambulatory Visit (INDEPENDENT_AMBULATORY_CARE_PROVIDER_SITE_OTHER): Payer: Medicare Other | Admitting: Podiatry

## 2016-11-22 ENCOUNTER — Encounter: Payer: Self-pay | Admitting: Podiatry

## 2016-11-22 DIAGNOSIS — M79676 Pain in unspecified toe(s): Secondary | ICD-10-CM

## 2016-11-22 DIAGNOSIS — B351 Tinea unguium: Secondary | ICD-10-CM

## 2016-11-22 NOTE — Progress Notes (Signed)
Patient ID: Beth LaudBetty H Hearty, female   DOB: 06/09/1941, 76 y.o.   MRN: [id number] Complaint:  Visit Type: Patient returns to my office for continued preventative foot care services. Complaint: Patient states" my nails have grown long and thick and become painful to walk and wear shoes" Patient has been diagnosed with DM with no foot complications. The patient presents for preventative foot care services. No changes to ROS  Podiatric Exam: Vascular: dorsalis pedis and posterior tibial pulses are palpable bilateral. Capillary return is immediate. Temperature gradient is WNL. Skin turgor WNL  Sensorium: Normal Semmes Weinstein monofilament test. Normal tactile sensation bilaterally. Nail Exam: Pt has thick disfigured discolored nails with subungual debris noted bilateral entire nail hallux through fifth toenails Ulcer Exam: There is no evidence of ulcer or pre-ulcerative changes or infection. Orthopedic Exam: Muscle tone and strength are WNL. No limitations in general ROM. No crepitus or effusions noted. Foot type and digits show no abnormalities. Bony prominences are unremarkable. Skin: No Porokeratosis. No infection or ulcers  Diagnosis:  Onychomycosis, , Pain in right toe, pain in left toes  Treatment & Plan Procedures and Treatment: Consent by patient was obtained for treatment procedures. The patient understood the discussion of treatment and procedures well. All questions were answered thoroughly reviewed. Debridement of mycotic and hypertrophic toenails, 1 through 5 bilateral and clearing of subungual debris. No ulceration, no infection noted.  Return Visit-Office Procedure: Patient instructed to return to the office for a follow up visit 3 months for continued evaluation and treatment.   Helane GuntherGregory Vendela Troung DPM

## 2016-11-23 DIAGNOSIS — I10 Essential (primary) hypertension: Secondary | ICD-10-CM | POA: Diagnosis not present

## 2016-12-28 DIAGNOSIS — I10 Essential (primary) hypertension: Secondary | ICD-10-CM | POA: Diagnosis not present

## 2017-01-09 DIAGNOSIS — H43813 Vitreous degeneration, bilateral: Secondary | ICD-10-CM | POA: Diagnosis not present

## 2017-01-09 DIAGNOSIS — H353131 Nonexudative age-related macular degeneration, bilateral, early dry stage: Secondary | ICD-10-CM | POA: Diagnosis not present

## 2017-01-09 DIAGNOSIS — H40009 Preglaucoma, unspecified, unspecified eye: Secondary | ICD-10-CM | POA: Diagnosis not present

## 2017-01-09 DIAGNOSIS — E119 Type 2 diabetes mellitus without complications: Secondary | ICD-10-CM | POA: Diagnosis not present

## 2017-01-27 ENCOUNTER — Emergency Department (HOSPITAL_COMMUNITY): Payer: Medicare Other

## 2017-01-27 ENCOUNTER — Inpatient Hospital Stay (HOSPITAL_COMMUNITY)
Admission: EM | Admit: 2017-01-27 | Discharge: 2017-02-03 | DRG: 208 | Disposition: A | Discharge status: Skilled Nursing Facility | Payer: Medicare Other | Attending: Internal Medicine | Admitting: Internal Medicine

## 2017-01-27 ENCOUNTER — Encounter (HOSPITAL_COMMUNITY): Payer: Self-pay | Admitting: *Deleted

## 2017-01-27 DIAGNOSIS — M6282 Rhabdomyolysis: Secondary | ICD-10-CM | POA: Diagnosis not present

## 2017-01-27 DIAGNOSIS — E1110 Type 2 diabetes mellitus with ketoacidosis without coma: Secondary | ICD-10-CM

## 2017-01-27 DIAGNOSIS — E870 Hyperosmolality and hypernatremia: Secondary | ICD-10-CM | POA: Diagnosis present

## 2017-01-27 DIAGNOSIS — S0990XA Unspecified injury of head, initial encounter: Secondary | ICD-10-CM | POA: Diagnosis not present

## 2017-01-27 DIAGNOSIS — Z4682 Encounter for fitting and adjustment of non-vascular catheter: Secondary | ICD-10-CM | POA: Diagnosis not present

## 2017-01-27 DIAGNOSIS — R278 Other lack of coordination: Secondary | ICD-10-CM | POA: Diagnosis not present

## 2017-01-27 DIAGNOSIS — I5031 Acute diastolic (congestive) heart failure: Secondary | ICD-10-CM | POA: Diagnosis not present

## 2017-01-27 DIAGNOSIS — R0781 Pleurodynia: Secondary | ICD-10-CM

## 2017-01-27 DIAGNOSIS — E860 Dehydration: Secondary | ICD-10-CM | POA: Diagnosis present

## 2017-01-27 DIAGNOSIS — Z6828 Body mass index (BMI) 28.0-28.9, adult: Secondary | ICD-10-CM

## 2017-01-27 DIAGNOSIS — R0902 Hypoxemia: Secondary | ICD-10-CM

## 2017-01-27 DIAGNOSIS — I248 Other forms of acute ischemic heart disease: Secondary | ICD-10-CM | POA: Diagnosis not present

## 2017-01-27 DIAGNOSIS — R000 Tachycardia, unspecified: Secondary | ICD-10-CM | POA: Diagnosis not present

## 2017-01-27 DIAGNOSIS — J690 Pneumonitis due to inhalation of food and vomit: Secondary | ICD-10-CM | POA: Diagnosis not present

## 2017-01-27 DIAGNOSIS — E785 Hyperlipidemia, unspecified: Secondary | ICD-10-CM | POA: Diagnosis present

## 2017-01-27 DIAGNOSIS — E440 Moderate protein-calorie malnutrition: Secondary | ICD-10-CM | POA: Diagnosis not present

## 2017-01-27 DIAGNOSIS — Z87891 Personal history of nicotine dependence: Secondary | ICD-10-CM

## 2017-01-27 DIAGNOSIS — I5033 Acute on chronic diastolic (congestive) heart failure: Secondary | ICD-10-CM | POA: Diagnosis not present

## 2017-01-27 DIAGNOSIS — R0602 Shortness of breath: Secondary | ICD-10-CM | POA: Diagnosis not present

## 2017-01-27 DIAGNOSIS — Z978 Presence of other specified devices: Secondary | ICD-10-CM

## 2017-01-27 DIAGNOSIS — W19XXXA Unspecified fall, initial encounter: Secondary | ICD-10-CM | POA: Diagnosis present

## 2017-01-27 DIAGNOSIS — Z7984 Long term (current) use of oral hypoglycemic drugs: Secondary | ICD-10-CM | POA: Diagnosis not present

## 2017-01-27 DIAGNOSIS — Z7982 Long term (current) use of aspirin: Secondary | ICD-10-CM

## 2017-01-27 DIAGNOSIS — D72829 Elevated white blood cell count, unspecified: Secondary | ICD-10-CM

## 2017-01-27 DIAGNOSIS — R55 Syncope and collapse: Secondary | ICD-10-CM

## 2017-01-27 DIAGNOSIS — I110 Hypertensive heart disease with heart failure: Secondary | ICD-10-CM | POA: Diagnosis present

## 2017-01-27 DIAGNOSIS — R41841 Cognitive communication deficit: Secondary | ICD-10-CM | POA: Diagnosis not present

## 2017-01-27 DIAGNOSIS — G9340 Encephalopathy, unspecified: Secondary | ICD-10-CM | POA: Diagnosis not present

## 2017-01-27 DIAGNOSIS — J9602 Acute respiratory failure with hypercapnia: Secondary | ICD-10-CM | POA: Diagnosis not present

## 2017-01-27 DIAGNOSIS — N200 Calculus of kidney: Secondary | ICD-10-CM | POA: Diagnosis not present

## 2017-01-27 DIAGNOSIS — J441 Chronic obstructive pulmonary disease with (acute) exacerbation: Secondary | ICD-10-CM | POA: Diagnosis not present

## 2017-01-27 DIAGNOSIS — R0989 Other specified symptoms and signs involving the circulatory and respiratory systems: Secondary | ICD-10-CM | POA: Diagnosis not present

## 2017-01-27 DIAGNOSIS — R748 Abnormal levels of other serum enzymes: Secondary | ICD-10-CM

## 2017-01-27 DIAGNOSIS — R413 Other amnesia: Secondary | ICD-10-CM | POA: Diagnosis present

## 2017-01-27 DIAGNOSIS — J90 Pleural effusion, not elsewhere classified: Secondary | ICD-10-CM | POA: Diagnosis not present

## 2017-01-27 DIAGNOSIS — J9811 Atelectasis: Secondary | ICD-10-CM | POA: Diagnosis not present

## 2017-01-27 DIAGNOSIS — E1129 Type 2 diabetes mellitus with other diabetic kidney complication: Secondary | ICD-10-CM | POA: Diagnosis present

## 2017-01-27 DIAGNOSIS — M6281 Muscle weakness (generalized): Secondary | ICD-10-CM | POA: Diagnosis not present

## 2017-01-27 DIAGNOSIS — N179 Acute kidney failure, unspecified: Secondary | ICD-10-CM | POA: Diagnosis present

## 2017-01-27 DIAGNOSIS — Z79899 Other long term (current) drug therapy: Secondary | ICD-10-CM | POA: Diagnosis not present

## 2017-01-27 DIAGNOSIS — G9341 Metabolic encephalopathy: Secondary | ICD-10-CM | POA: Diagnosis present

## 2017-01-27 DIAGNOSIS — E2740 Unspecified adrenocortical insufficiency: Secondary | ICD-10-CM | POA: Diagnosis present

## 2017-01-27 DIAGNOSIS — R579 Shock, unspecified: Secondary | ICD-10-CM | POA: Diagnosis not present

## 2017-01-27 DIAGNOSIS — R1312 Dysphagia, oropharyngeal phase: Secondary | ICD-10-CM | POA: Diagnosis not present

## 2017-01-27 DIAGNOSIS — D539 Nutritional anemia, unspecified: Secondary | ICD-10-CM | POA: Diagnosis present

## 2017-01-27 DIAGNOSIS — J449 Chronic obstructive pulmonary disease, unspecified: Secondary | ICD-10-CM | POA: Diagnosis not present

## 2017-01-27 DIAGNOSIS — R778 Other specified abnormalities of plasma proteins: Secondary | ICD-10-CM | POA: Diagnosis present

## 2017-01-27 DIAGNOSIS — R4182 Altered mental status, unspecified: Secondary | ICD-10-CM

## 2017-01-27 DIAGNOSIS — R2681 Unsteadiness on feet: Secondary | ICD-10-CM | POA: Diagnosis not present

## 2017-01-27 DIAGNOSIS — R7989 Other specified abnormal findings of blood chemistry: Secondary | ICD-10-CM | POA: Diagnosis not present

## 2017-01-27 DIAGNOSIS — Z4659 Encounter for fitting and adjustment of other gastrointestinal appliance and device: Secondary | ICD-10-CM

## 2017-01-27 DIAGNOSIS — Y92009 Unspecified place in unspecified non-institutional (private) residence as the place of occurrence of the external cause: Secondary | ICD-10-CM

## 2017-01-27 DIAGNOSIS — E1165 Type 2 diabetes mellitus with hyperglycemia: Secondary | ICD-10-CM | POA: Diagnosis present

## 2017-01-27 DIAGNOSIS — J9601 Acute respiratory failure with hypoxia: Secondary | ICD-10-CM | POA: Diagnosis present

## 2017-01-27 DIAGNOSIS — E872 Acidosis: Secondary | ICD-10-CM | POA: Diagnosis present

## 2017-01-27 DIAGNOSIS — R319 Hematuria, unspecified: Secondary | ICD-10-CM

## 2017-01-27 DIAGNOSIS — R2689 Other abnormalities of gait and mobility: Secondary | ICD-10-CM | POA: Diagnosis not present

## 2017-01-27 DIAGNOSIS — R079 Chest pain, unspecified: Secondary | ICD-10-CM | POA: Diagnosis not present

## 2017-01-27 HISTORY — DX: Rhabdomyolysis: M62.82

## 2017-01-27 LAB — CBC WITH DIFFERENTIAL/PLATELET
BASOS ABS: 0 10*3/uL (ref 0.0–0.1)
BASOS PCT: 0 %
Eosinophils Absolute: 0 10*3/uL (ref 0.0–0.7)
Eosinophils Relative: 0 %
HEMATOCRIT: 43.9 % (ref 36.0–46.0)
HEMOGLOBIN: 13.7 g/dL (ref 12.0–15.0)
LYMPHS PCT: 6 %
Lymphs Abs: 0.8 10*3/uL (ref 0.7–4.0)
MCH: 31.9 pg (ref 26.0–34.0)
MCHC: 31.2 g/dL (ref 30.0–36.0)
MCV: 102.1 fL — AB (ref 78.0–100.0)
MONO ABS: 0.7 10*3/uL (ref 0.1–1.0)
Monocytes Relative: 5 %
NEUTROS ABS: 11.5 10*3/uL — AB (ref 1.7–7.7)
NEUTROS PCT: 89 %
Platelets: 209 10*3/uL (ref 150–400)
RBC: 4.3 MIL/uL (ref 3.87–5.11)
RDW: 14.6 % (ref 11.5–15.5)
WBC: 13 10*3/uL — ABNORMAL HIGH (ref 4.0–10.5)

## 2017-01-27 LAB — COMPREHENSIVE METABOLIC PANEL
ALT: 19 U/L (ref 14–54)
AST: 41 U/L (ref 15–41)
Albumin: 3.8 g/dL (ref 3.5–5.0)
Alkaline Phosphatase: 43 U/L (ref 38–126)
Anion gap: 13 (ref 5–15)
BUN: 48 mg/dL — ABNORMAL HIGH (ref 6–20)
CHLORIDE: 102 mmol/L (ref 101–111)
CO2: 31 mmol/L (ref 22–32)
CREATININE: 1.12 mg/dL — AB (ref 0.44–1.00)
Calcium: 8.7 mg/dL — ABNORMAL LOW (ref 8.9–10.3)
GFR calc non Af Amer: 46 mL/min — ABNORMAL LOW (ref >60)
GFR, EST AFRICAN AMERICAN: 53 mL/min — AB (ref >60)
Glucose, Bld: 198 mg/dL — ABNORMAL HIGH (ref 65–99)
POTASSIUM: 3.7 mmol/L (ref 3.5–5.1)
Sodium: 146 mmol/L — ABNORMAL HIGH (ref 135–145)
Total Bilirubin: 0.6 mg/dL (ref 0.3–1.2)
Total Protein: 7.3 g/dL (ref 6.5–8.1)

## 2017-01-27 LAB — URINALYSIS, ROUTINE W REFLEX MICROSCOPIC
Bilirubin Urine: NEGATIVE
GLUCOSE, UA: 50 mg/dL — AB
Ketones, ur: 5 mg/dL — AB
Leukocytes, UA: NEGATIVE
Nitrite: NEGATIVE
PROTEIN: 100 mg/dL — AB
Specific Gravity, Urine: 1.02 (ref 1.005–1.030)
pH: 5 (ref 5.0–8.0)

## 2017-01-27 LAB — D-DIMER, QUANTITATIVE (NOT AT ARMC): D DIMER QUANT: 3.97 ug{FEU}/mL — AB (ref 0.00–0.50)

## 2017-01-27 LAB — CK: Total CK: 1612 U/L — ABNORMAL HIGH (ref 38–234)

## 2017-01-27 LAB — TROPONIN I
TROPONIN I: 0.09 ng/mL — AB (ref <0.03)
TROPONIN I: 0.07 ng/mL — AB (ref <0.03)

## 2017-01-27 LAB — CBG MONITORING, ED: Glucose-Capillary: 173 mg/dL — ABNORMAL HIGH (ref 65–99)

## 2017-01-27 LAB — LACTIC ACID, PLASMA: LACTIC ACID, VENOUS: 1.7 mmol/L (ref 0.5–1.9)

## 2017-01-27 MED ORDER — SODIUM CHLORIDE 0.9 % IV BOLUS (SEPSIS): 500.0000 mL | Freq: Once | INTRAVENOUS | Status: DC

## 2017-01-27 MED ORDER — ACETAMINOPHEN 325 MG PO TABS: 650.0000 mg | ORAL_TABLET | Freq: Four times a day (QID) | ORAL | Status: DC | PRN

## 2017-01-27 MED ORDER — ROSUVASTATIN CALCIUM 10 MG PO TABS: 10.0000 mg | ORAL_TABLET | Freq: Every evening | ORAL | Status: DC

## 2017-01-27 MED ORDER — SODIUM CHLORIDE 0.9% FLUSH
3.0000 mL | Freq: Two times a day (BID) | INTRAVENOUS | Status: DC
  Administered 2017-01-28 – 2017-02-02 (×7): 3 mL via INTRAVENOUS

## 2017-01-27 MED ORDER — IPRATROPIUM-ALBUTEROL 0.5-2.5 (3) MG/3ML IN SOLN
3.0000 mL | Freq: Once | RESPIRATORY_TRACT | Status: AC
  Administered 2017-01-27: 3 mL via RESPIRATORY_TRACT
  Filled 2017-01-27: qty 3

## 2017-01-27 MED ORDER — SODIUM CHLORIDE 0.9 % IV SOLN
INTRAVENOUS | Status: DC
  Administered 2017-01-27 (×2): via INTRAVENOUS

## 2017-01-27 MED ORDER — ONDANSETRON HCL 4 MG PO TABS: 4.0000 mg | ORAL_TABLET | Freq: Four times a day (QID) | ORAL | Status: DC | PRN

## 2017-01-27 MED ORDER — POLYETHYLENE GLYCOL 3350 17 G PO PACK
17.0000 g | PACK | Freq: Every day | ORAL | Status: DC | PRN
Start: 2017-01-27 — End: 2017-02-03

## 2017-01-27 MED ORDER — SODIUM CHLORIDE 0.9 % IV SOLN
INTRAVENOUS | Status: DC
  Administered 2017-01-27: via INTRAVENOUS

## 2017-01-27 MED ORDER — SODIUM CHLORIDE 0.9 % IV BOLUS (SEPSIS)
500.0000 mL | Freq: Once | INTRAVENOUS | Status: AC
  Administered 2017-01-27: 500 mL via INTRAVENOUS

## 2017-01-27 MED ORDER — ALBUTEROL SULFATE (2.5 MG/3ML) 0.083% IN NEBU
5.0000 mg | INHALATION_SOLUTION | Freq: Once | RESPIRATORY_TRACT | Status: AC
  Administered 2017-01-27: 5 mg via RESPIRATORY_TRACT
  Filled 2017-01-27: qty 6

## 2017-01-27 MED ORDER — ACETAMINOPHEN 650 MG RE SUPP: 650.0000 mg | Freq: Four times a day (QID) | RECTAL | Status: DC | PRN

## 2017-01-27 MED ORDER — ONDANSETRON HCL 4 MG/2ML IJ SOLN: 4.0000 mg | Freq: Four times a day (QID) | INTRAMUSCULAR | Status: DC | PRN

## 2017-01-27 MED ORDER — ASPIRIN EC 81 MG PO TBEC
81.0000 mg | DELAYED_RELEASE_TABLET | Freq: Every day | ORAL | Status: DC
  Administered 2017-01-30 – 2017-02-03 (×5): 81 mg via ORAL
  Filled 2017-01-27 (×5): qty 1

## 2017-01-27 MED ORDER — INSULIN ASPART 100 UNIT/ML ~~LOC~~ SOLN
0.0000 [IU] | SUBCUTANEOUS | Status: DC
Start: 2017-01-28 — End: 2017-01-28
  Administered 2017-01-28: 2 [IU] via SUBCUTANEOUS

## 2017-01-27 NOTE — ED Provider Notes (Signed)
[facility]-EMERGENCY DEPT Provider Note   CSN: [id number] Arrival date & time: 01/27/17  1725     History   Chief Complaint Chief Complaint  Patient presents with  . Fall  . Loss of Consciousness    HPI CHERAL CAPPUCCI is a 76 y.o. female.  HPI  Pt was seen at 1755. Per EMS and pt report: Pt c/o sudden onset and resolution of one episode of fall 4 days ago. Pt states she "knew it was Monday" when she fell.  Pt was unable to get up after falling. States she "seems to have lost some time" and now realizes it is Friday. Pt's co-worker checked in on pt and then called EMS when she could not get inside the house. Pt denies any specific complaints. Pt usually walks without assistance. Denies CP/SOB, no cough, no abd pain, no N/V/D, no focal motor weakness, no tingling/numbness in extremities.     Past Medical History:  Diagnosis Date  . Diabetes mellitus without complication (HCC)   . Emphysema/COPD (HCC)   . Hyperlipidemia   . Hypertension     There are no active problems to display for this patient.   Past Surgical History:  Procedure Laterality Date  . DILATION AND CURETTAGE OF UTERUS    . TONSILLECTOMY      OB History    No data available       Home Medications    Prior to Admission medications   Medication Sig Start Date End Date Taking? Authorizing Provider  amLODipine (NORVASC) 10 MG tablet TK 1 T PO D 12/28/16   [provider]  aspirin EC 81 MG tablet Take 81 mg by mouth every morning.     [provider]  CRESTOR 10 MG tablet Take 10 mg by mouth every evening.  09/16/14   [provider]  JANUVIA 50 MG tablet TK 1 T PO QD 01/03/17   [provider]  losartan-hydrochlorothiazide (HYZAAR) 100-12.5 MG per tablet Take 1 tablet by mouth every morning.  11/12/14   [provider]  metFORMIN (GLUCOPHAGE) 1000 MG tablet Take 1,000 mg by mouth 2 (two) times daily with a meal.  10/18/14   [provider]    valsartan-hydrochlorothiazide (DIOVAN-HCT) 320-12.5 MG tablet TK 1 T PO QAM 01/10/17   [provider]    Family History No family history on file.  Social History Social History  Substance Use Topics  . Smoking status: Former Games developer  . Smokeless tobacco: Never Used  . Alcohol use No     Allergies   Demerol [meperidine]   Review of Systems Review of Systems ROS: Statement: All systems negative except as marked or noted in the HPI; Constitutional: Negative for fever and chills. ; ; Eyes: Negative for eye pain, redness and discharge. ; ; ENMT: Negative for ear pain, hoarseness, nasal congestion, sinus pressure and sore throat. ; ; Cardiovascular: Negative for chest pain, palpitations, diaphoresis, dyspnea and peripheral edema. ; ; Respiratory: Negative for cough, wheezing and stridor. ; ; Gastrointestinal: Negative for nausea, vomiting, diarrhea, abdominal pain, blood in stool, hematemesis, jaundice and rectal bleeding. . ; ; Genitourinary: Negative for dysuria, flank pain and hematuria. ; ; Musculoskeletal: Negative for back pain and neck pain. Negative for swelling and trauma.; ; Skin: Negative for pruritus, rash, abrasions, blisters, bruising and skin lesion.; ; Neuro: +fall, syncope. Negative for headache, lightheadedness and neck stiffness. Negative for extremity weakness, paresthesias, involuntary movement, seizure.       Physical Exam Updated Vital  Signs BP (!) 119/56 (BP Location: Right Arm)   Pulse 98   Temp 97.4 F (36.3 C) (Oral)   Resp 15   SpO2 (!) 73%    Patient Vitals for the past 24 hrs:  BP Temp Temp src Pulse Resp SpO2  01/27/17 2046 (!) 118/55 - - 96 15 92 %  01/27/17 2000 103/86 - - - 17 -  01/27/17 1930 100/62 - - (!) 123 (!) 24 97 %  01/27/17 1929 (!) 122/58 - - (!) 123 (!) 26 99 %  01/27/17 1922 - - - (!) 102 (!) 22 99 %  01/27/17 1736 (!) 119/56 97.4 F (36.3 C) Oral 98 15 (!) 73 %    19:30 Orthostatic Vital Signs LH  Orthostatic Lying    BP- Lying: 122/58  Pulse- Lying: 123      Orthostatic Sitting  BP- Sitting: 107/64  Pulse- Sitting: 105      Orthostatic Standing at 0 minutes  BP- Standing at 0 minutes: 90/59  Pulse- Standing at 0 minutes: 107     Physical Exam 1800: Physical examination:  Nursing notes reviewed; Vital signs and O2 SAT reviewed;  Constitutional: Well developed, Well nourished, In no acute distress; Head:  Normocephalic, atraumatic; Eyes: EOMI, PERRL, No scleral icterus; ENMT: Mouth and pharynx normal, Mucous membranes dry and cracked; Neck: Supple, Full range of motion, No lymphadenopathy; Cardiovascular: Tachycardic rate and rhythm, No gallop; Respiratory: Breath sounds diminished & equal bilaterally, No wheezes.  Speaking full sentences with ease, Normal respiratory effort/excursion; Chest: Nontender, Movement normal; Abdomen: Soft, Nontender, Nondistended, Normal bowel sounds; Genitourinary: No CVA tenderness; Extremities: Pulses normal, Pelvis stable. Pt is able to lift extended bilat LE's up off stretcher. No deformity, no tenderness, No edema, No calf edema or asymmetry.; Neuro: AA&Ox3, Major CN grossly intact. No facial droop. Speech clear. Grips equal. Strength 5/5 equal bilat UE's and LE's. No gross focal motor or sensory deficits in extremities.; Skin: Color normal, Warm, Dry.   ED Treatments / Results  Labs (all labs ordered are listed, but only abnormal results are displayed)   EKG  EKG Interpretation  Date/Time:  Friday January 27 2017 17:55:45 EDT Ventricular Rate:  100 PR Interval:    QRS Duration: 131 QT Interval:  379 QTC Calculation: 489 R Axis:   110 Text Interpretation:  Sinus tachycardia RBBB and LPFB Baseline wander No old tracing to compare Confirmed by [facility] LPMCMANUS  MD, Nicholos JohnsKATHLEEN [phone]([id number]) on 01/27/2017 6:07:27 PM       Radiology   Procedures Procedures (including critical care time)  Medications Ordered in ED Medications  ipratropium-albuterol (DUONEB) 0.5-2.5 (3)  MG/3ML nebulizer solution 3 mL (not administered)  albuterol (PROVENTIL) (2.5 MG/3ML) 0.083% nebulizer solution 5 mg (not administered)  0.9 %  sodium chloride infusion (not administered)  sodium chloride 0.9 % bolus 500 mL (500 mLs Intravenous New Bag/Given 01/27/17 1822)     Initial Impression / Assessment and Plan / ED Course  I have reviewed the triage vital signs and the nursing notes.  Pertinent labs & imaging results that were available during my care of the patient were reviewed by me and considered in my medical decision making (see chart for details).  MDM Reviewed: previous chart, nursing note and vitals Reviewed previous: labs and ECG Interpretation: labs, ECG, x-ray and CT scan   Results for orders placed or performed during the hospital encounter of 01/27/17  Comprehensive metabolic panel  Result Value Ref Range   Sodium 146 (H) 135 - 145 mmol/L  Potassium 3.7 3.5 - 5.1 mmol/L   Chloride 102 101 - 111 mmol/L   CO2 31 22 - 32 mmol/L   Glucose, Bld 198 (H) 65 - 99 mg/dL   BUN 48 (H) 6 - 20 mg/dL   Creatinine, Ser 1.61 (H) 0.44 - 1.00 mg/dL   Calcium 8.7 (L) 8.9 - 10.3 mg/dL   Total Protein 7.3 6.5 - 8.1 g/dL   Albumin 3.8 3.5 - 5.0 g/dL   AST 41 15 - 41 U/L   ALT 19 14 - 54 U/L   Alkaline Phosphatase 43 38 - 126 U/L   Total Bilirubin 0.6 0.3 - 1.2 mg/dL   GFR calc non Af Amer 46 (L) >60 mL/min   GFR calc Af Amer 53 (L) >60 mL/min   Anion gap 13 5 - 15  Troponin I  Result Value Ref Range   Troponin I 0.09 (HH) <0.03 ng/mL  Lactic acid, plasma  Result Value Ref Range   Lactic Acid, Venous 1.7 0.5 - 1.9 mmol/L  CBC with Differential  Result Value Ref Range   WBC 13.0 (H) 4.0 - 10.5 K/uL   RBC 4.30 3.87 - 5.11 MIL/uL   Hemoglobin 13.7 12.0 - 15.0 g/dL   HCT 09.6 04.5 - 40.9 %   MCV 102.1 (H) 78.0 - 100.0 fL   MCH 31.9 26.0 - 34.0 pg   MCHC 31.2 30.0 - 36.0 g/dL   RDW 81.1 91.4 - 78.2 %   Platelets 209 150 - 400 K/uL   Neutrophils Relative % 89 %   Neutro  Abs 11.5 (H) 1.7 - 7.7 K/uL   Lymphocytes Relative 6 %   Lymphs Abs 0.8 0.7 - 4.0 K/uL   Monocytes Relative 5 %   Monocytes Absolute 0.7 0.1 - 1.0 K/uL   Eosinophils Relative 0 %   Eosinophils Absolute 0.0 0.0 - 0.7 K/uL   Basophils Relative 0 %   Basophils Absolute 0.0 0.0 - 0.1 K/uL  CK  Result Value Ref Range   Total CK 1,612 (H) 38 - 234 U/L  CBG monitoring, ED  Result Value Ref Range   Glucose-Capillary 173 (H) 65 - 99 mg/dL   Comment 1 Notify RN    Comment 2 Document in Chart    Dg Chest 2 View Result Date: 01/27/2017 CLINICAL DATA:  Fall and chest pain EXAM: CHEST  2 VIEW COMPARISON:  06/27/2016 FINDINGS: Minimal atelectasis at the left lung base. Slight increased apical opacity on the right. Normal heart size. No pneumothorax. Degenerative changes of the right shoulder with calcific tendinitis. IMPRESSION: 1. Subtle increased density in the apical portion of the right upper lobe, may be artifactual and related to positioning and summation artifact, however short interval radiographic follow-up recommended to exclude parenchymal opacity in the region. 2. Minimal linear atelectasis at the left base. Electronically Signed   By: Jasmine Pang M.D.   On: 01/27/2017 19:06   Ct Head Wo Contrast Result Date: 01/27/2017 CLINICAL DATA:  Patient fell this past Monday with questionable loss of consciousness. EXAM: CT HEAD WITHOUT CONTRAST TECHNIQUE: Contiguous axial images were obtained from the base of the skull through the vertex without intravenous contrast. COMPARISON:  None. FINDINGS: Brain: No evidence of acute infarction, hemorrhage, hydrocephalus, extra-axial collection or mass lesion/mass effect. Vascular: Moderate carotid siphon calcifications bilaterally. No hyperdense vessels. Skull: No fracture or primary bone lesions. Sinuses/Orbits: Clear mastoids and paranasal sinuses. Intact orbits and globes. Bilateral lens replacements surgeries. Other: Mild left posterior parietal scalp contusion.  IMPRESSION:  No acute intracranial abnormality. Left posterior parietal scalp contusion. Electronically Signed   By: Tollie Eth M.D.   On: 01/27/2017 18:54    2115:  Short neb given on arrival for low O2 Sats with improvement. IVF bolus given and gtt started for elevated CK and orthostasis on VS. Troponin mildly elevated, but pt denies CP. Dx and testing d/w pt and family.  Questions answered.  Verb understanding, agreeable to admit. T/C to [facility] Dr. Adela Glimpse, case discussed, including:  HPI, pertinent PM/SHx, VS/PE, dx testing, ED course and treatment:  Agreeable to come to ED for evaluation to admit.     Final Clinical Impressions(s) / ED Diagnoses   Final diagnoses:  None    New Prescriptions New Prescriptions   No medications on file     Samuel Jester, DO 01/30/17 2120

## 2017-01-27 NOTE — ED Notes (Signed)
Pt was placed on a bedpan for urine sample but was unsuccesful

## 2017-01-27 NOTE — ED Triage Notes (Signed)
Per EMS, pt from home fell in her bathroom 4 nights ago. Pt states she has been unable to get up since the fall. Coworker checked on patient and called EMS when she could not get inside house. Pt is at baseline mental status. Pt denies pain. Pt is unable to walk without assistance.  BP 138/58 HR 100 RR 18 CBG 231

## 2017-01-27 NOTE — ED Notes (Signed)
Bed: [id number] Expected date:  Expected time:  Means of arrival:  Comments: FALL

## 2017-01-27 NOTE — H&P (Addendum)
Beth Kerr BJY:[id number] DOB: [birth year] DOA: 01/27/2017     PCP: [name], MD   Outpatient Specialists: Podiatry, cardiology [place] Patient coming from:    home Lives alone,       Chief Complaint: fall was found down  HPI: Beth Kerr is a 76 y.o. female with medical history significant of DM2, HTN, COPD    Presented with a fall 4 nights ago she was unable to get up since her fall and spent 4 days in the bathroom. Coworker did not find her at work and became concerned and called the mass who had to break into her house to find her. She had had a chest pain shortness of breath she is unsure how she fell denies any recent nausea vomiting or diarrhea. Denies any neurological complaints but she apparently has lost track of time. She was found to be hypoxic down to 73% while in emergency part  [place] and that is the last thing that she remember she has no recollection of falling or being on the floor. She denies any prior events. No recent change in medications, She usually does not check her sugar. No recent hypoglycemia events that she remembers. No chest pain no shortness of breath.   Regarding pertinent Chronic problems: Diabetes mellitus on by mouth medications.   IN ER:  Temp (24hrs), Avg:97.4 F (36.3 C), Min:97.4 F (36.3 C), Max:97.4 F (36.3 C)       RR 15   Oxygen saturation 73% on RA Hr 96 BP 118/55 Na 146 Cr 1.12 Alb 3.8 Trop 0.09 LA 1.7  WBC 13Hg 13.7  CK 1612   CXR - possible artifact Following Medications were ordered in ER: Medications  0.9 %  sodium chloride infusion (500 mLs Intravenous Rate/Dose Change 01/27/17 2035)  sodium chloride 0.9 % bolus 500 mL (500 mLs Intravenous Not Given 01/27/17 2036)  ipratropium-albuterol (DUONEB) 0.5-2.5 (3) MG/3ML nebulizer solution 3 mL (3 mLs Nebulization Given 01/27/17 1904)  albuterol (PROVENTIL) (2.5 MG/3ML) 0.083% nebulizer solution 5 mg (5 mg Nebulization Given 01/27/17 1904)    sodium chloride 0.9 % bolus 500 mL (0 mLs Intravenous Stopped 01/27/17 1910)      Hospitalist was called for admission for Dehydration rhabdomyolysis  Review of Systems:    Pertinent positives include: dry mouth,   Constitutional:  No weight loss, night sweats, Fevers, chills, fatigue, weight loss  HEENT:  No headaches, Difficulty swallowing,Tooth/dental problems,Sore throat,  No sneezing, itching, ear ache, nasal congestion, post nasal drip,  Cardio-vascular:  No chest pain, Orthopnea, PND, anasarca, dizziness, palpitations.no Bilateral lower extremity swelling  GI:  No heartburn, indigestion, abdominal pain, nausea, vomiting, diarrhea, change in bowel habits, loss of appetite, melena, blood in stool, hematemesis Resp:  no shortness of breath at rest. No dyspnea on exertion, No excess mucus, no productive cough, No non-productive cough, No coughing up of blood.No change in color of mucus.No wheezing. Skin:  no rash or lesions. No jaundice GU:  no dysuria, change in color of urine, no urgency or frequency. No straining to urinate.  No flank pain.  Musculoskeletal:  No joint pain or no joint swelling. No decreased range of motion. No back pain.  Psych:  No change in mood or affect. No depression or anxiety. No memory loss.  Neuro: no localizing neurological complaints, no tingling, no weakness, no double vision, no gait abnormality, no slurred speech, no confusion  As per HPI otherwise 10 point review of  systems negative.   Past Medical History: Past Medical History:  Diagnosis Date  . Diabetes mellitus without complication (HCC)   . Emphysema/COPD (HCC)   . Hyperlipidemia   . Hypertension    Past Surgical History:  Procedure Laterality Date  . DILATION AND CURETTAGE OF UTERUS    . TONSILLECTOMY       Social History:  Ambulatory  Cane,     reports that she has quit smoking. She has never used smokeless tobacco. She reports that she does not drink alcohol or use  drugs.  Allergies:   Allergies  Allergen Reactions  . Demerol [Meperidine] Anaphylaxis       Family History:   Family History  Problem Relation Age of Onset  . CAD Father   . Lung cancer Other   . Diabetes Neg Hx   . Stroke Neg Hx     Medications: Prior to Admission medications   Medication Sig Start Date End Date Taking? Authorizing Provider  amLODipine (NORVASC) 10 MG tablet TK 1 T PO D 12/28/16  Yes [provider]  aspirin EC 81 MG tablet Take 81 mg by mouth every morning.    Yes [provider]  CRESTOR 10 MG tablet Take 10 mg by mouth every evening.  09/16/14  Yes [provider]  JANUVIA 50 MG tablet TK 1 T PO QD 01/03/17  Yes [provider]  metFORMIN (GLUCOPHAGE) 1000 MG tablet Take 1,000 mg by mouth 2 (two) times daily with a meal.  10/18/14  Yes [provider]  valsartan-hydrochlorothiazide (DIOVAN-HCT) 320-12.5 MG tablet TK 1 T PO QAM 01/10/17  Yes [provider]    Physical Exam: Patient Vitals for the past 24 hrs:  BP Temp Temp src Pulse Resp SpO2  01/27/17 2046 (!) 118/55 - - 96 15 92 %  01/27/17 2000 103/86 - - - 17 -  01/27/17 1930 100/62 - - (!) 123 (!) 24 97 %  01/27/17 1929 (!) 122/58 - - (!) 123 (!) 26 99 %  01/27/17 1922 - - - (!) 102 (!) 22 99 %  01/27/17 1736 (!) 119/56 97.4 F (36.3 C) Oral 98 15 (!) 73 %    1. General:  in No Acute distress 2. Psychological: Alert and   Oriented 3. Head/ENT:    Dry Mucous Membranes                          Head Non traumatic, neck supple                            Poor Dentition 4. SKIN:  decreased Skin turgor,  Skin clean Dry and intact no rash 5. Heart: Regular rate and rhythm no  Murmur, Rub or gallop 6. Lungs: no wheezes or crackles   7. Abdomen: Soft, non-tender,   distended 8. Lower extremities: no clubbing, cyanosis, or edema 9. Neurologically   strength 5 out of 5 in all 4 extremities cranial nerves II through XII intact. Having trouble with  short-term memory. 10. MSK: Normal range of motion   body mass index is unknown because there is no height or weight on file.  Labs on Admission:   Labs on Admission: I have personally reviewed following labs and imaging studies  CBC:  Recent Labs Lab 01/27/17 1918  WBC 13.0*  NEUTROABS 11.5*  HGB 13.7  HCT 43.9  MCV 102.1*  PLT 209  Basic Metabolic Panel:  Recent Labs Lab 01/27/17 1918  NA 146*  K 3.7  CL 102  CO2 31  GLUCOSE 198*  BUN 48*  CREATININE 1.12*  CALCIUM 8.7*   GFR: CrCl cannot be calculated (Unknown ideal weight.). Liver Function Tests:  Recent Labs Lab 01/27/17 1918  AST 41  ALT 19  ALKPHOS 43  BILITOT 0.6  PROT 7.3  ALBUMIN 3.8   No results for input(s): LIPASE, AMYLASE in the last 168 hours. No results for input(s): AMMONIA in the last 168 hours. Coagulation Profile: No results for input(s): INR, PROTIME in the last 168 hours. Cardiac Enzymes:  Recent Labs Lab 01/27/17 1918  CKTOTAL 1,612*  TROPONINI 0.09*   BNP (last 3 results) No results for input(s): PROBNP in the last 8760 hours. HbA1C: No results for input(s): HGBA1C in the last 72 hours. CBG:  Recent Labs Lab 01/27/17 1802  GLUCAP 173*   Lipid Profile: No results for input(s): CHOL, HDL, LDLCALC, TRIG, CHOLHDL, LDLDIRECT in the last 72 hours. Thyroid Function Tests: No results for input(s): TSH, T4TOTAL, FREET4, T3FREE, THYROIDAB in the last 72 hours. Anemia Panel: No results for input(s): VITAMINB12, FOLATE, FERRITIN, TIBC, IRON, RETICCTPCT in the last 72 hours. Urine analysis: No results found for: COLORURINE, APPEARANCEUR, LABSPEC, PHURINE, GLUCOSEU, HGBUR, BILIRUBINUR, KETONESUR, PROTEINUR, UROBILINOGEN, NITRITE, LEUKOCYTESUR Sepsis Labs: @LABRCNTIP (procalcitonin:4,lacticidven:4) )No results found for this or any previous visit (from the past 240 hour(s)).     UA no evidence of UTI   Blood in urine  No results found for: HGBA1C  CrCl cannot be  calculated (Unknown ideal weight.).  BNP (last 3 results) No results for input(s): PROBNP in the last 8760 hours.   ECG REPORT  Independently reviewed Rate:100  Rhythm: RBBB ST&T Change: No acute ischemic changes   QTC 489  There were no vitals filed for this visit.   Cultures: No results found for: SDES, SPECREQUEST, CULT, REPTSTATUS   Radiological Exams on Admission: Dg Chest 2 View  Result Date: 01/27/2017 CLINICAL DATA:  Fall and chest pain EXAM: CHEST  2 VIEW COMPARISON:  06/27/2016 FINDINGS: Minimal atelectasis at the left lung base. Slight increased apical opacity on the right. Normal heart size. No pneumothorax. Degenerative changes of the right shoulder with calcific tendinitis. IMPRESSION: 1. Subtle increased density in the apical portion of the right upper lobe, may be artifactual and related to positioning and summation artifact, however short interval radiographic follow-up recommended to exclude parenchymal opacity in the region. 2. Minimal linear atelectasis at the left base. Electronically Signed   By: Jasmine Pang M.D.   On: 01/27/2017 19:06   Ct Head Wo Contrast  Result Date: 01/27/2017 CLINICAL DATA:  Patient fell this past Monday with questionable loss of consciousness. EXAM: CT HEAD WITHOUT CONTRAST TECHNIQUE: Contiguous axial images were obtained from the base of the skull through the vertex without intravenous contrast. COMPARISON:  None. FINDINGS: Brain: No evidence of acute infarction, hemorrhage, hydrocephalus, extra-axial collection or mass lesion/mass effect. Vascular: Moderate carotid siphon calcifications bilaterally. No hyperdense vessels. Skull: No fracture or primary bone lesions. Sinuses/Orbits: Clear mastoids and paranasal sinuses. Intact orbits and globes. Bilateral lens replacements surgeries. Other: Mild left posterior parietal scalp contusion. IMPRESSION: No acute intracranial abnormality. Left posterior parietal scalp contusion. Electronically Signed    By: Tollie Eth M.D.   On: 01/27/2017 18:54    Chart has been reviewed    Assessment/Plan   76 y.o. female with medical history significant of DM2, HTN, COPD admitted with Dehydration  and rhabdomyolysis  Present on Admission:  . Rhabdomyolysis -rehydrate and follow CK likely due to patient being down for prolonged period time . Elevated troponin no chest pain likely secondary to demand ischemia in the setting of severe dehydration . Dehydration will rehydrate and follow fluid status . DM (diabetes mellitus), type 2 with renal complications (HCC) . Leukocytosis at this point no evidence of infection likely stress reaction and hemoconcentration . Hypoxic transient etiology unclear given recent travel elevated d-dimer and troponin to obtain CTA angio  chest . Syncope and collapse - unclear etiology patient unable to recollect. We'll cycle cardiac enzymes obtain carotid Dopplers and echo gram  . Global amnesia -possibly secondary to delirium although underlying neurological abnormality cannot be ruled out will obtain MRI of the brain  Patient continues to have altered mental status and trouble forming new memories will obtain EEG is well supported and workup . 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  DVT prophylaxis:    Lovenox     Code Status:   Full code  as per family need to readdress if patient's once mental status back to baseline  Family Communication:   Family  at  Bedside  plan of care was discussed with Grand-Son   Disposition Plan:     To home once workup is complete and patient is stable                       Would benefit from PT/OT eval prior to DC  ordered                      Nutrition    consulted                          Consults called: ER discussed case with neurology please reconsult if any evidence of abnormalities on  workup or  for persistent amnesia  Admission status:   obs   Level of care   tele            I have spent a total of 66 min on this admission   Aleysia Oltmann 01/28/2017, 2:32 AM    [facility]  Pager [phone]   after 2 AM please page floor coverage PA If 7AM-7PM, please contact the day team taking care of the patient  [email or web address]  Password [email or web address]

## 2017-01-28 ENCOUNTER — Inpatient Hospital Stay (HOSPITAL_COMMUNITY): Payer: Medicare Other | Admitting: Anesthesiology

## 2017-01-28 ENCOUNTER — Inpatient Hospital Stay (HOSPITAL_COMMUNITY): Payer: Medicare Other

## 2017-01-28 ENCOUNTER — Encounter (HOSPITAL_COMMUNITY): Payer: Self-pay | Admitting: Certified Registered Nurse Anesthetist

## 2017-01-28 ENCOUNTER — Observation Stay (HOSPITAL_COMMUNITY): Payer: Medicare Other

## 2017-01-28 ENCOUNTER — Encounter (HOSPITAL_COMMUNITY): Payer: Self-pay | Admitting: Radiology

## 2017-01-28 DIAGNOSIS — M6282 Rhabdomyolysis: Secondary | ICD-10-CM | POA: Diagnosis present

## 2017-01-28 DIAGNOSIS — R413 Other amnesia: Secondary | ICD-10-CM | POA: Diagnosis not present

## 2017-01-28 DIAGNOSIS — Z79899 Other long term (current) drug therapy: Secondary | ICD-10-CM | POA: Diagnosis not present

## 2017-01-28 DIAGNOSIS — G9340 Encephalopathy, unspecified: Secondary | ICD-10-CM | POA: Diagnosis not present

## 2017-01-28 DIAGNOSIS — I5033 Acute on chronic diastolic (congestive) heart failure: Secondary | ICD-10-CM | POA: Diagnosis present

## 2017-01-28 DIAGNOSIS — E440 Moderate protein-calorie malnutrition: Secondary | ICD-10-CM | POA: Diagnosis present

## 2017-01-28 DIAGNOSIS — E1129 Type 2 diabetes mellitus with other diabetic kidney complication: Secondary | ICD-10-CM | POA: Diagnosis present

## 2017-01-28 DIAGNOSIS — R55 Syncope and collapse: Secondary | ICD-10-CM

## 2017-01-28 DIAGNOSIS — D539 Nutritional anemia, unspecified: Secondary | ICD-10-CM | POA: Diagnosis present

## 2017-01-28 DIAGNOSIS — I248 Other forms of acute ischemic heart disease: Secondary | ICD-10-CM | POA: Diagnosis present

## 2017-01-28 DIAGNOSIS — I110 Hypertensive heart disease with heart failure: Secondary | ICD-10-CM | POA: Diagnosis present

## 2017-01-28 DIAGNOSIS — Z7984 Long term (current) use of oral hypoglycemic drugs: Secondary | ICD-10-CM | POA: Diagnosis not present

## 2017-01-28 DIAGNOSIS — N200 Calculus of kidney: Secondary | ICD-10-CM | POA: Diagnosis not present

## 2017-01-28 DIAGNOSIS — E785 Hyperlipidemia, unspecified: Secondary | ICD-10-CM | POA: Diagnosis present

## 2017-01-28 DIAGNOSIS — R0902 Hypoxemia: Secondary | ICD-10-CM | POA: Diagnosis not present

## 2017-01-28 DIAGNOSIS — W19XXXA Unspecified fall, initial encounter: Secondary | ICD-10-CM | POA: Diagnosis present

## 2017-01-28 DIAGNOSIS — R579 Shock, unspecified: Secondary | ICD-10-CM | POA: Diagnosis present

## 2017-01-28 DIAGNOSIS — R0602 Shortness of breath: Secondary | ICD-10-CM | POA: Diagnosis not present

## 2017-01-28 DIAGNOSIS — E860 Dehydration: Secondary | ICD-10-CM | POA: Diagnosis not present

## 2017-01-28 DIAGNOSIS — R748 Abnormal levels of other serum enzymes: Secondary | ICD-10-CM | POA: Diagnosis not present

## 2017-01-28 DIAGNOSIS — G9341 Metabolic encephalopathy: Secondary | ICD-10-CM | POA: Diagnosis not present

## 2017-01-28 DIAGNOSIS — Z4682 Encounter for fitting and adjustment of non-vascular catheter: Secondary | ICD-10-CM | POA: Diagnosis not present

## 2017-01-28 DIAGNOSIS — N179 Acute kidney failure, unspecified: Secondary | ICD-10-CM | POA: Diagnosis present

## 2017-01-28 DIAGNOSIS — J9602 Acute respiratory failure with hypercapnia: Secondary | ICD-10-CM | POA: Diagnosis present

## 2017-01-28 DIAGNOSIS — R0989 Other specified symptoms and signs involving the circulatory and respiratory systems: Secondary | ICD-10-CM | POA: Diagnosis not present

## 2017-01-28 DIAGNOSIS — E872 Acidosis: Secondary | ICD-10-CM | POA: Diagnosis present

## 2017-01-28 DIAGNOSIS — J9811 Atelectasis: Secondary | ICD-10-CM | POA: Diagnosis not present

## 2017-01-28 DIAGNOSIS — E2740 Unspecified adrenocortical insufficiency: Secondary | ICD-10-CM | POA: Diagnosis present

## 2017-01-28 DIAGNOSIS — J9601 Acute respiratory failure with hypoxia: Secondary | ICD-10-CM

## 2017-01-28 DIAGNOSIS — E870 Hyperosmolality and hypernatremia: Secondary | ICD-10-CM | POA: Diagnosis present

## 2017-01-28 DIAGNOSIS — R4182 Altered mental status, unspecified: Secondary | ICD-10-CM | POA: Diagnosis not present

## 2017-01-28 DIAGNOSIS — J441 Chronic obstructive pulmonary disease with (acute) exacerbation: Secondary | ICD-10-CM | POA: Diagnosis not present

## 2017-01-28 DIAGNOSIS — Z87891 Personal history of nicotine dependence: Secondary | ICD-10-CM | POA: Diagnosis not present

## 2017-01-28 DIAGNOSIS — J90 Pleural effusion, not elsewhere classified: Secondary | ICD-10-CM | POA: Diagnosis not present

## 2017-01-28 DIAGNOSIS — J690 Pneumonitis due to inhalation of food and vomit: Secondary | ICD-10-CM | POA: Diagnosis not present

## 2017-01-28 LAB — COMPREHENSIVE METABOLIC PANEL
ALK PHOS: 39 U/L (ref 38–126)
ALT: 17 U/L (ref 14–54)
AST: 31 U/L (ref 15–41)
Albumin: 3.2 g/dL — ABNORMAL LOW (ref 3.5–5.0)
Anion gap: 8 (ref 5–15)
BUN: 48 mg/dL — AB (ref 6–20)
CALCIUM: 8.2 mg/dL — AB (ref 8.9–10.3)
CHLORIDE: 106 mmol/L (ref 101–111)
CO2: 32 mmol/L (ref 22–32)
CREATININE: 1.07 mg/dL — AB (ref 0.44–1.00)
GFR calc non Af Amer: 49 mL/min — ABNORMAL LOW (ref >60)
GFR, EST AFRICAN AMERICAN: 57 mL/min — AB (ref >60)
Glucose, Bld: 189 mg/dL — ABNORMAL HIGH (ref 65–99)
Potassium: 4.3 mmol/L (ref 3.5–5.1)
SODIUM: 146 mmol/L — AB (ref 135–145)
Total Bilirubin: 0.4 mg/dL (ref 0.3–1.2)
Total Protein: 6.7 g/dL (ref 6.5–8.1)

## 2017-01-28 LAB — BLOOD GAS, ARTERIAL
ACID-BASE DEFICIT: 0.5 mmol/L (ref 0.0–2.0)
Acid-Base Excess: 1.6 mmol/L (ref 0.0–2.0)
Acid-Base Excess: 1.4 mmol/L (ref 0.0–2.0)
Acid-Base Excess: 2.5 mmol/L — ABNORMAL HIGH (ref 0.0–2.0)
BICARBONATE: 30.7 mmol/L — AB (ref 20.0–28.0)
BICARBONATE: 29 mmol/L — AB (ref 20.0–28.0)
BICARBONATE: 28.9 mmol/L — AB (ref 20.0–28.0)
Bicarbonate: 31.7 mmol/L — ABNORMAL HIGH (ref 20.0–28.0)
DRAWN BY: 270211
Drawn by: 225631
Drawn by: 270211
Drawn by: 270211
FIO2: 100
FIO2: 1
FIO2: 0.5
FIO2: 0.4
LHR: 16 {breaths}/min
MECHVT: 350 mL
O2 Content: 15 L/min
O2 SAT: 99.6 %
O2 SAT: 99.3 %
O2 SAT: 96.4 %
O2 Saturation: 95.6 %
PATIENT TEMPERATURE: 98.6
PATIENT TEMPERATURE: 98.6
PCO2 ART: 91.5 mmHg — AB (ref 32.0–48.0)
PCO2 ART: 65.4 mmHg — AB (ref 32.0–48.0)
PEEP/CPAP: 5 cmH2O
PEEP/CPAP: 5 cmH2O
PH ART: 7.205 — AB (ref 7.350–7.450)
PH ART: 7.27 — AB (ref 7.350–7.450)
PO2 ART: 397 mmHg — AB (ref 83.0–108.0)
PO2 ART: 275 mmHg — AB (ref 83.0–108.0)
PO2 ART: 83.7 mmHg (ref 83.0–108.0)
Patient temperature: 98.1
Patient temperature: 98.6
RATE: 16 resp/min
RATE: 19 resp/min
VT: 350 mL
pCO2 arterial: 82.9 mmHg (ref 32.0–48.0)
pCO2 arterial: 55.8 mmHg — ABNORMAL HIGH (ref 32.0–48.0)
pH, Arterial: 7.152 — CL (ref 7.350–7.450)
pH, Arterial: 7.335 — ABNORMAL LOW (ref 7.350–7.450)
pO2, Arterial: 96.1 mmHg (ref 83.0–108.0)

## 2017-01-28 LAB — CBC
HCT: 41.4 % (ref 36.0–46.0)
Hemoglobin: 12.4 g/dL (ref 12.0–15.0)
MCH: 31.4 pg (ref 26.0–34.0)
MCHC: 30 g/dL (ref 30.0–36.0)
MCV: 104.8 fL — AB (ref 78.0–100.0)
PLATELETS: 167 10*3/uL (ref 150–400)
RBC: 3.95 MIL/uL (ref 3.87–5.11)
RDW: 15.1 % (ref 11.5–15.5)
WBC: 12.1 10*3/uL — ABNORMAL HIGH (ref 4.0–10.5)

## 2017-01-28 LAB — GLUCOSE, CAPILLARY
GLUCOSE-CAPILLARY: 185 mg/dL — AB (ref 65–99)
GLUCOSE-CAPILLARY: 198 mg/dL — AB (ref 65–99)
GLUCOSE-CAPILLARY: 169 mg/dL — AB (ref 65–99)
Glucose-Capillary: 200 mg/dL — ABNORMAL HIGH (ref 65–99)
Glucose-Capillary: 211 mg/dL — ABNORMAL HIGH (ref 65–99)

## 2017-01-28 LAB — TROPONIN I
TROPONIN I: 0.08 ng/mL — AB (ref <0.03)
TROPONIN I: 0.05 ng/mL — AB (ref <0.03)

## 2017-01-28 LAB — MRSA PCR SCREENING: MRSA by PCR: NEGATIVE

## 2017-01-28 LAB — ECHOCARDIOGRAM COMPLETE
Height: 62 in
WEIGHTICAEL: 2504.43 [oz_av]

## 2017-01-28 LAB — CK: CK TOTAL: 913 U/L — AB (ref 38–234)

## 2017-01-28 LAB — MAGNESIUM: Magnesium: 2.3 mg/dL (ref 1.7–2.4)

## 2017-01-28 LAB — PHOSPHORUS: Phosphorus: 5.1 mg/dL — ABNORMAL HIGH (ref 2.5–4.6)

## 2017-01-28 LAB — TSH: TSH: 2.022 u[IU]/mL (ref 0.350–4.500)

## 2017-01-28 MED ORDER — MIDAZOLAM HCL 2 MG/2ML IJ SOLN
1.0000 mg | INTRAMUSCULAR | Status: DC | PRN
  Administered 2017-01-29 – 2017-01-30 (×3): 1 mg via INTRAVENOUS
  Filled 2017-01-28 (×5): qty 2

## 2017-01-28 MED ORDER — SODIUM CHLORIDE 0.9 % IV SOLN
25.0000 ug/h | INTRAVENOUS | Status: DC
  Filled 2017-01-28: qty 50

## 2017-01-28 MED ORDER — FUROSEMIDE 10 MG/ML IJ SOLN
40.0000 mg | Freq: Once | INTRAMUSCULAR | Status: DC
  Filled 2017-01-28: qty 4

## 2017-01-28 MED ORDER — ORAL CARE MOUTH RINSE
15.0000 mL | Freq: Two times a day (BID) | OROMUCOSAL | Status: DC
  Administered 2017-01-28 – 2017-01-30 (×4): 15 mL via OROMUCOSAL

## 2017-01-28 MED ORDER — IPRATROPIUM-ALBUTEROL 0.5-2.5 (3) MG/3ML IN SOLN
3.0000 mL | RESPIRATORY_TRACT | Status: DC
  Administered 2017-01-28 – 2017-01-29 (×6): 3 mL via RESPIRATORY_TRACT
  Filled 2017-01-28 (×7): qty 3

## 2017-01-28 MED ORDER — METHYLPREDNISOLONE SODIUM SUCC 40 MG IJ SOLR
40.0000 mg | Freq: Four times a day (QID) | INTRAMUSCULAR | Status: DC
  Administered 2017-01-28 – 2017-01-30 (×8): 40 mg via INTRAVENOUS
  Filled 2017-01-28 (×8): qty 1

## 2017-01-28 MED ORDER — IOPAMIDOL (ISOVUE-370) INJECTION 76%
INTRAVENOUS | Status: AC
  Filled 2017-01-28: qty 100

## 2017-01-28 MED ORDER — MIDAZOLAM HCL 2 MG/2ML IJ SOLN
1.0000 mg | INTRAMUSCULAR | Status: AC | PRN
  Administered 2017-01-29 – 2017-01-30 (×3): 1 mg via INTRAVENOUS
  Filled 2017-01-28 (×2): qty 2

## 2017-01-28 MED ORDER — PANTOPRAZOLE SODIUM 40 MG IV SOLR
40.0000 mg | Freq: Every day | INTRAVENOUS | Status: DC
  Administered 2017-01-28 – 2017-01-30 (×3): 40 mg via INTRAVENOUS
  Filled 2017-01-28 (×3): qty 40

## 2017-01-28 MED ORDER — SODIUM CHLORIDE 0.9 % IV SOLN: 250.0000 mL | INTRAVENOUS | Status: DC | PRN

## 2017-01-28 MED ORDER — ALBUTEROL SULFATE (2.5 MG/3ML) 0.083% IN NEBU: 2.5000 mg | INHALATION_SOLUTION | RESPIRATORY_TRACT | Status: DC | PRN

## 2017-01-28 MED ORDER — HEPARIN SODIUM (PORCINE) 5000 UNIT/ML IJ SOLN
5000.0000 [IU] | Freq: Three times a day (TID) | INTRAMUSCULAR | Status: DC
  Administered 2017-01-28 – 2017-01-30 (×7): 5000 [IU] via SUBCUTANEOUS
  Filled 2017-01-28 (×7): qty 1

## 2017-01-28 MED ORDER — LIDOCAINE HCL (CARDIAC) 20 MG/ML IV SOLN
INTRAVENOUS | Status: DC | PRN
  Administered 2017-01-28: 100 mg via INTRAVENOUS

## 2017-01-28 MED ORDER — FENTANYL 2500MCG IN NS 250ML (10MCG/ML) PREMIX INFUSION
25.0000 ug/h | INTRAVENOUS | Status: DC
  Administered 2017-01-28: 25 ug/h via INTRAVENOUS
  Filled 2017-01-28 (×2): qty 250

## 2017-01-28 MED ORDER — GADOBENATE DIMEGLUMINE 529 MG/ML IV SOLN
10.0000 mL | Freq: Once | INTRAVENOUS | Status: AC | PRN
  Administered 2017-01-28: 10 mL via INTRAVENOUS

## 2017-01-28 MED ORDER — PIPERACILLIN-TAZOBACTAM 3.375 G IVPB
3.3750 g | Freq: Three times a day (TID) | INTRAVENOUS | Status: DC
  Administered 2017-01-28 – 2017-01-31 (×9): 3.375 g via INTRAVENOUS
  Filled 2017-01-28 (×9): qty 50

## 2017-01-28 MED ORDER — ETOMIDATE 2 MG/ML IV SOLN
INTRAVENOUS | Status: DC | PRN
  Administered 2017-01-28: 20 mg via INTRAVENOUS

## 2017-01-28 MED ORDER — ACETAMINOPHEN 325 MG PO TABS: 650.0000 mg | ORAL_TABLET | ORAL | Status: DC | PRN

## 2017-01-28 MED ORDER — ETOMIDATE 2 MG/ML IV SOLN
20.0000 mg | Freq: Once | INTRAVENOUS | Status: AC
  Administered 2017-01-28: 20 mg via INTRAVENOUS

## 2017-01-28 MED ORDER — FUROSEMIDE 10 MG/ML IJ SOLN
20.0000 mg | Freq: Once | INTRAMUSCULAR | Status: AC
  Administered 2017-01-28: 20 mg via INTRAVENOUS
  Filled 2017-01-28: qty 2

## 2017-01-28 MED ORDER — DEXTROSE 5 % IV SOLN
500.0000 mg | INTRAVENOUS | Status: DC
  Administered 2017-01-29 – 2017-01-31 (×3): 500 mg via INTRAVENOUS
  Filled 2017-01-28 (×3): qty 500

## 2017-01-28 MED ORDER — PHENYLEPHRINE HCL-NACL 10-0.9 MG/250ML-% IV SOLN
0.0000 ug/min | INTRAVENOUS | Status: DC
  Administered 2017-01-28: 25 ug/min via INTRAVENOUS
  Administered 2017-01-28: 20 ug/min via INTRAVENOUS
  Administered 2017-01-29: 40 ug/min via INTRAVENOUS
  Administered 2017-01-29: 15 ug/min via INTRAVENOUS
  Filled 2017-01-28 (×5): qty 250

## 2017-01-28 MED ORDER — FENTANYL CITRATE (PF) 100 MCG/2ML IJ SOLN: 50.0000 ug | Freq: Once | INTRAMUSCULAR | Status: AC

## 2017-01-28 MED ORDER — ALBUTEROL SULFATE (2.5 MG/3ML) 0.083% IN NEBU: 2.5000 mg | INHALATION_SOLUTION | RESPIRATORY_TRACT | Status: DC

## 2017-01-28 MED ORDER — VANCOMYCIN HCL 500 MG IV SOLR
500.0000 mg | Freq: Two times a day (BID) | INTRAVENOUS | Status: DC
  Administered 2017-01-29 – 2017-01-30 (×3): 500 mg via INTRAVENOUS
  Filled 2017-01-28 (×3): qty 500

## 2017-01-28 MED ORDER — DEXMEDETOMIDINE HCL IN NACL 400 MCG/100ML IV SOLN
0.0000 ug/kg/h | INTRAVENOUS | Status: DC
  Administered 2017-01-28 (×3): 0.4 ug/kg/h via INTRAVENOUS
  Filled 2017-01-28 (×2): qty 100

## 2017-01-28 MED ORDER — INSULIN ASPART 100 UNIT/ML ~~LOC~~ SOLN
2.0000 [IU] | SUBCUTANEOUS | Status: DC
  Administered 2017-01-28 (×2): 4 [IU] via SUBCUTANEOUS
  Administered 2017-01-28 – 2017-01-29 (×3): 6 [IU] via SUBCUTANEOUS
  Administered 2017-01-29: 4 [IU] via SUBCUTANEOUS

## 2017-01-28 MED ORDER — IPRATROPIUM-ALBUTEROL 0.5-2.5 (3) MG/3ML IN SOLN
3.0000 mL | Freq: Four times a day (QID) | RESPIRATORY_TRACT | Status: DC
  Administered 2017-01-28: 3 mL via RESPIRATORY_TRACT
  Filled 2017-01-28: qty 3

## 2017-01-28 MED ORDER — DOCUSATE SODIUM 50 MG/5ML PO LIQD: 100.0000 mg | Freq: Two times a day (BID) | ORAL | Status: DC | PRN

## 2017-01-28 MED ORDER — CHLORHEXIDINE GLUCONATE 0.12 % MT SOLN
15.0000 mL | Freq: Two times a day (BID) | OROMUCOSAL | Status: DC
  Administered 2017-01-28 – 2017-01-29 (×3): 15 mL via OROMUCOSAL
  Filled 2017-01-28 (×2): qty 15

## 2017-01-28 MED ORDER — FENTANYL BOLUS VIA INFUSION
25.0000 ug | INTRAVENOUS | Status: DC | PRN
  Administered 2017-01-28 – 2017-01-29 (×3): 25 ug via INTRAVENOUS
  Filled 2017-01-28: qty 25

## 2017-01-28 MED ORDER — FENTANYL CITRATE (PF) 100 MCG/2ML IJ SOLN
INTRAMUSCULAR | Status: AC
  Administered 2017-01-28: 200 ug
  Filled 2017-01-28: qty 4

## 2017-01-28 MED ORDER — PROPOFOL 10 MG/ML IV BOLUS: 20.0000 mg | Freq: Once | INTRAVENOUS | Status: DC

## 2017-01-28 MED ORDER — DEXTROSE 5 % IV SOLN
500.0000 mg | Freq: Once | INTRAVENOUS | Status: AC
  Administered 2017-01-28: 500 mg via INTRAVENOUS
  Filled 2017-01-28 (×2): qty 500

## 2017-01-28 MED ORDER — VANCOMYCIN HCL IN DEXTROSE 1-5 GM/200ML-% IV SOLN
1000.0000 mg | Freq: Once | INTRAVENOUS | Status: AC
  Administered 2017-01-28: 1000 mg via INTRAVENOUS
  Filled 2017-01-28: qty 200

## 2017-01-28 MED ORDER — METHYLPREDNISOLONE SODIUM SUCC 125 MG IJ SOLR
125.0000 mg | Freq: Once | INTRAMUSCULAR | Status: AC
  Administered 2017-01-28: 125 mg via INTRAVENOUS
  Filled 2017-01-28: qty 2

## 2017-01-28 MED ORDER — IOPAMIDOL (ISOVUE-370) INJECTION 76%
100.0000 mL | Freq: Once | INTRAVENOUS | Status: AC | PRN
  Administered 2017-01-28: 100 mL via INTRAVENOUS

## 2017-01-28 NOTE — Progress Notes (Signed)
eLink Physician-Brief Progress Note Patient Name: Beth LaudBetty H Opperman DOB: 1940-09-09 MRN: [id number]   Date of Service  01/28/2017  HPI/Events of Note  Hypotension - BP = 66/29. Precedex IV infusion now held.   eICU Interventions  Will order: 1. Phenylephrine IV infusion. Titrate to MAP >= 65.     Intervention Category Intermediate Interventions: Hypotension - evaluation and management  [name] 01/28/2017, 4:40 PM

## 2017-01-28 NOTE — Progress Notes (Signed)
PROGRESS NOTE [facility]   Beth Kerr   ZOX:[id number] DOB: [birth year]  DOA: 01/27/2017 PCP: Pearson GrippeKim, James, MD   Brief Narrative:  Beth Kerr 76 year old female with medical history significant for diabetes mellitus type 2, hypertension, COPD assented to the emergency department after being found on the floor since she fell 4 nights ago. She was found by her coworker who was trying to call her but responsive for the past 3 days. Patient was admitted for hypoxia and rhabdomyolysis started on IV fluids and workup for syncope was initiated. On the floor patient continued to be hypoxic ABG was done which showed CO2 of 89 patient was placed on BiPAP and transferred to SDU.  Subjective: Patient was seen and examined the morning with grandson at bedside, she was responsive to tactile stimuli and mildly responsive to verbal stimuli follow some commands like opening eyes and trying to squeeze fingers. Other than that she was lethargic and not interactive.  Assessment & Plan: Acute hypoxic hypercapnic respiratory failure of unknown etiology, suspect likely aspiration pneumonia from patient being down after a fall and COPD exacerbation MRI of the head pending, CT negative PCCM consulted and patient will likely be intubated  I ordered IV antibiotics continued for now Check respiratory viral panel  Syncope and collapse Echo was done not significant finding Carotid Doppler negative MRI pending EKG with no significant changes  Mild rhabdo  continue IV fluids   DVT prophylaxis: Lovenox  Code Status: Partial Family Communication: Grandson at bedside Disposition Plan: Unable to determine at this time  Consultants:   PCCM  Procedures:     Antimicrobials: Anti-infectives    Start     Dose/Rate Route Frequency Ordered Stop   01/29/17 1000  vancomycin (VANCOCIN) 500 mg in sodium chloride 0.9 % 100 mL IVPB     500 mg 100 mL/hr over 60 Minutes Intravenous Every 12 hours  01/28/17 1023     01/29/17 0600  azithromycin (ZITHROMAX) 500 mg in dextrose 5 % 250 mL IVPB     500 mg 250 mL/hr over 60 Minutes Intravenous Every 24 hours 01/28/17 1217 02/02/17 0559   01/28/17 1200  piperacillin-tazobactam (ZOSYN) IVPB 3.375 g     3.375 g 12.5 mL/hr over 240 Minutes Intravenous Every 8 hours 01/28/17 1023     01/28/17 1100  vancomycin (VANCOCIN) IVPB 1000 mg/200 mL premix     1,000 mg 200 mL/hr over 60 Minutes Intravenous  Once 01/28/17 1023 01/28/17 1328   01/28/17 0600  azithromycin (ZITHROMAX) 500 mg in dextrose 5 % 250 mL IVPB     500 mg 250 mL/hr over 60 Minutes Intravenous  Once 01/28/17 0556 01/28/17 0800          Objective: Vitals:   01/28/17 0637 01/28/17 0800 01/28/17 0900 01/28/17 0908  BP: (!) 96/45 (!) 85/40 (!) 64/25 (!) 93/36  Pulse: 87 86 72 70  Resp: (!) 23 19 20 18   Temp: 98.4 F (36.9 C) 98.4 F (36.9 C)    TempSrc: Axillary Oral    SpO2: 100% 100% 100% 96%  Weight:      Height:        Intake/Output Summary (Last 24 hours) at 01/28/17 1014 Last data filed at 01/28/17 0637  Gross per 24 hour  Intake          3396.68 ml  Output                0 ml  Net  3396.68 ml   Filed Weights   01/27/17 2349 01/28/17 0631  Weight: 70.5 kg (155 lb 6.8 oz) 71 kg (156 lb 8.4 oz)    Examination:  General exam: Lethargic and respiratory distress  HEENT:  PERRLA Respiratory system: Tachypnea, shallow breathing with use accessory muscle. Decrease breath sounds b/l.  Cardiovascular system: S1 & S2 heard, RRR. No JVD, murmurs, rubs or gallops Gastrointestinal system: Abdomen is nondistended, soft and nontender. No organomegaly or masses felt. Normal bowel sounds heard. Central nervous system: Follow commands open eyes only,  Extremities: No pedal edema. Skin: No rashes, lesions or ulcers  Data Reviewed: I have personally reviewed following labs and imaging studies  CBC:  Recent Labs Lab 01/27/17 1918 01/28/17 0523  WBC 13.0*  12.1*  NEUTROABS 11.5*  --   HGB 13.7 12.4  HCT 43.9 41.4  MCV 102.1* 104.8*  PLT 209 167   Basic Metabolic Panel:  Recent Labs Lab 01/27/17 1918 01/28/17 0523  NA 146* 146*  K 3.7 4.3  CL 102 106  CO2 31 32  GLUCOSE 198* 189*  BUN 48* 48*  CREATININE 1.12* 1.07*  CALCIUM 8.7* 8.2*  MG  --  2.3  PHOS  --  5.1*   GFR: Estimated Creatinine Clearance: 40.7 mL/min (A) (by C-G formula based on SCr of 1.07 mg/dL (H)). Liver Function Tests:  Recent Labs Lab 01/27/17 1918 01/28/17 0523  AST 41 31  ALT 19 17  ALKPHOS 43 39  BILITOT 0.6 0.4  PROT 7.3 6.7  ALBUMIN 3.8 3.2*   Cardiac Enzymes:  Recent Labs Lab 01/27/17 1918 01/27/17 2143 01/28/17 0523  CKTOTAL 1,612*  --  913*  TROPONINI 0.09* 0.07* 0.08*   BNP (last 3 results) No results for input(s): PROBNP in the last 8760 hours. HbA1C: No results for input(s): HGBA1C in the last 72 hours. CBG:  Recent Labs Lab 01/27/17 1802 01/28/17 0516 01/28/17 0758  GLUCAP 173* 185* 200*   Lipid Profile: No results for input(s): CHOL, HDL, LDLCALC, TRIG, CHOLHDL, LDLDIRECT in the last 72 hours. Thyroid Function Tests:  Recent Labs  01/28/17 0523  TSH 2.022   Anemia Panel: No results for input(s): VITAMINB12, FOLATE, FERRITIN, TIBC, IRON, RETICCTPCT in the last 72 hours. Sepsis Labs:  Recent Labs Lab 01/27/17 1918  LATICACIDVEN 1.7    Recent Results (from the past 240 hour(s))  MRSA PCR Screening     Status: None   Collection Time: 01/28/17  6:52 AM  Result Value Ref Range Status   MRSA by PCR NEGATIVE NEGATIVE Final    Comment:        The GeneXpert MRSA Assay (FDA approved for NASAL specimens only), is one component of a comprehensive MRSA colonization surveillance program. It is not intended to diagnose MRSA infection nor to guide or monitor treatment for MRSA infections.       Radiology Studies: Dg Chest 2 View  Result Date: 01/27/2017 CLINICAL DATA:  Fall and chest pain EXAM: CHEST  2  VIEW COMPARISON:  06/27/2016 FINDINGS: Minimal atelectasis at the left lung base. Slight increased apical opacity on the right. Normal heart size. No pneumothorax. Degenerative changes of the right shoulder with calcific tendinitis. IMPRESSION: 1. Subtle increased density in the apical portion of the right upper lobe, may be artifactual and related to positioning and summation artifact, however short interval radiographic follow-up recommended to exclude parenchymal opacity in the region. 2. Minimal linear atelectasis at the left base. Electronically Signed   By: Adrian Prows.D.  On: 01/27/2017 19:06   Ct Head Wo Contrast  Result Date: 01/27/2017 CLINICAL DATA:  Patient fell this past Monday with questionable loss of consciousness. EXAM: CT HEAD WITHOUT CONTRAST TECHNIQUE: Contiguous axial images were obtained from the base of the skull through the vertex without intravenous contrast. COMPARISON:  None. FINDINGS: Brain: No evidence of acute infarction, hemorrhage, hydrocephalus, extra-axial collection or mass lesion/mass effect. Vascular: Moderate carotid siphon calcifications bilaterally. No hyperdense vessels. Skull: No fracture or primary bone lesions. Sinuses/Orbits: Clear mastoids and paranasal sinuses. Intact orbits and globes. Bilateral lens replacements surgeries. Other: Mild left posterior parietal scalp contusion. IMPRESSION: No acute intracranial abnormality. Left posterior parietal scalp contusion. Electronically Signed   By: Tollie Eth M.D.   On: 01/27/2017 18:54   Ct Angio Chest Pe W Or Wo Contrast  Result Date: 01/28/2017 CLINICAL DATA:  Hypoxic event EXAM: CT ANGIOGRAPHY CHEST WITH CONTRAST TECHNIQUE: Multidetector CT imaging of the chest was performed using the standard protocol during bolus administration of intravenous contrast. Multiplanar CT image reconstructions and MIPs were obtained to evaluate the vascular anatomy. CONTRAST:  100 mL Isovue 370 intravenous COMPARISON:  Radiograph  01/27/2017 FINDINGS: Cardiovascular: Satisfactory opacification of the pulmonary arteries to the segmental level. No evidence of pulmonary embolism. Non aneurysmal aorta. Atherosclerotic calcifications. Coronary artery calcifications. Normal heart size. No pericardial effusion. Mediastinum/Nodes: Esophagus within normal limits. Midline trachea. No thyroid mass. No significantly enlarged mediastinal lymph nodes. Lungs/Pleura: Mild emphysema within the upper lobes. Small area of focal consolidation in the lingula may reflect atelectasis or a small infiltrate. Trace pleural effusions. Upper Abdomen: 13 mm left adrenal gland nodule. Diffuse nodularity of the right adrenal gland partially visualized. Musculoskeletal: Mild compression of of T7. Review of the MIP images confirms the above findings. IMPRESSION: 1. Negative for acute pulmonary embolus. 2. Small focal consolidation in the lingula may reflect atelectasis or small infiltrate. There are trace effusions 3. Mild emphysema 4. Indeterminate 13 mm left adrenal gland nodule partially visualized. Diffuse nodularity of the right adrenal gland. Further evaluation with adrenal CT is suggested. Electronically Signed   By: Jasmine Pang M.D.   On: 01/28/2017 03:38   Dg Chest Port 1 View  Result Date: 01/28/2017 CLINICAL DATA:  Acute onset of hypoxia.  Initial encounter. EXAM: PORTABLE CHEST 1 VIEW COMPARISON:  Chest radiograph performed 01/27/2017, and CTA of the chest performed earlier today at 3:15 a.m. FINDINGS: The lungs are well-aerated. Mild vascular congestion is noted. Mild bibasilar airspace opacities are noted, possibly reflecting atelectasis or mild pneumonia. This appears new at the right lung base. There is no evidence of pleural effusion or pneumothorax. The cardiomediastinal silhouette is borderline enlarged. No acute osseous abnormalities are seen. IMPRESSION: Mild vascular congestion and borderline cardiomegaly. Mild bibasilar airspace opacities may  reflect atelectasis or mild pneumonia. Electronically Signed   By: Roanna Raider M.D.   On: 01/28/2017 06:10    Scheduled Meds: . aspirin EC  81 mg Oral Daily  . chlorhexidine  15 mL Mouth Rinse BID  . insulin aspart  0-9 Units Subcutaneous Q4H  . iopamidol      . ipratropium-albuterol  3 mL Nebulization Q6H  . mouth rinse  15 mL Mouth Rinse q12n4p  . rosuvastatin  10 mg Oral QPM  . sodium chloride flush  3 mL Intravenous Q12H   Continuous Infusions: . sodium chloride 75 mL/hr at 01/28/17 0812  . sodium chloride       LOS: 0 days    Latrelle Dodrill, MD Pager: Text Page  via [email or web address]  [phone]  If 7PM-7AM, please contact night-coverage [email or web address] Password [email or web address] 01/28/2017, 10:14 AM

## 2017-01-28 NOTE — Progress Notes (Signed)
eLink Physician-Brief Progress Note Patient Name: Beth LaudBetty H Kerr DOB: 1941-04-23 MRN: [id number]   Date of Service  01/28/2017  HPI/Events of Note  Notified of need for stress ulcer prophylaxis. Patient is intubated and ventilated.   eICU Interventions  Will order: 1. Protonix IV.      Intervention Category Intermediate Interventions: Best-practice therapies (e.g. DVT, beta blocker, etc.)  Sommer,Steven Eugene 01/28/2017, 9:38 PM

## 2017-01-28 NOTE — Progress Notes (Signed)
  Echocardiogram 2D Echocardiogram has been performed.  Beth Kerr, Beth Kerr 01/28/2017, 9:03 AM

## 2017-01-28 NOTE — Anesthesia Procedure Notes (Addendum)
Procedure Name: Intubation Date/Time: 01/28/2017 11:31 AM Performed by: Vanessa DurhamOCHRAN, RONALD GLENN Pre-anesthesia Checklist: Patient identified, Emergency Drugs available, Suction available, Patient being monitored and Timeout performed Patient Re-evaluated:Patient Re-evaluated prior to inductionOxygen Delivery Method: Ambu bag Preoxygenation: Pre-oxygenation with 100% oxygen Intubation Type: IV induction Ventilation: Mask ventilation without difficulty Laryngoscope Size: Glidescope Grade View: Grade I Tube type: Subglottic suction tube Tube size: 7.5 mm Number of attempts: 1 Airway Equipment and Method: Stylet and Video-laryngoscopy Placement Confirmation: ETT inserted through vocal cords under direct vision,  CO2 detector and breath sounds checked- equal and bilateral Secured at: 22 cm Tube secured with: Securement device per RT.

## 2017-01-28 NOTE — Progress Notes (Signed)
*  PRELIMINARY RESULTS* Vascular Ultrasound Carotid Duplex (Doppler) has been completed.  Preliminary findings: Right 40-59% ICA stenosis. Left 1-39%, borderline >40%, ICA stenosis. Antegrade vertebral flow.    Farrel DemarkJill Eunice, RDMS, RVT  01/28/2017, 11:27 AM

## 2017-01-28 NOTE — Progress Notes (Signed)
I have discussed case with patient's grandson. I ask about patient wishes in term of resuscitation. He reports that she does not wants to be resuscitated or having intubation. Will make patient DNR. Grandson verbalizes understanding. Will change to DNR status in the system.  Latrelle DodrillEdwin Silva, MD

## 2017-01-28 NOTE — Progress Notes (Signed)
PT Cancellation Note  Patient Details Name: Beth Kerr MRN: [id number] DOB: 07-Jul-1941   Cancelled Treatment:     PT order received but eval deferred.  RN advises pt on bipap and very lethargic.  Will follow.   Quincey Quesinberry 01/28/2017, 9:00 AM

## 2017-01-28 NOTE — Anesthesia Preprocedure Evaluation (Signed)
Anesthesia Evaluation  Patient identified by MRN, date of birth, ID band Patient unresponsive  Preop documentation limited or incomplete due to emergent nature of procedure.  Airway Mallampati: II  TM Distance: >3 FB     Dental no notable dental hx.    Pulmonary COPD, former smoker,    + rhonchi        Cardiovascular hypertension, Normal cardiovascular exam     Neuro/Psych    GI/Hepatic   Endo/Other  diabetes  Renal/GU      Musculoskeletal   Abdominal   Peds  Hematology   Anesthesia Other Findings   Reproductive/Obstetrics                             Anesthesia Physical Anesthesia Plan  ASA: IV and emergent  Anesthesia Plan: General   Post-op Pain Management:    Induction:   Airway Management Planned:   Additional Equipment:   Intra-op Plan:   Post-operative Plan:   Informed Consent:   Plan Discussed with:   Anesthesia Plan Comments:         Anesthesia Quick Evaluation

## 2017-01-28 NOTE — Progress Notes (Signed)
Pharmacy Antibiotic Note  Beth LaudBetty H Kerr is a 76 y.o. female admitted on 01/27/2017 with after found unresponsive on bathroom floor x4 days.  CXR + mild pneumonia.  She was started on Zithromax empirically, however became hypoxic overnight and was transferred to ICU.  Pharmacy has been consulted for Vancomycin & Cefepime dosing.  Concern for aspiration noted.  Paged Dr Edward JollySilva and will change Cefepime to Zosyn for anaerobic coverage.   01/28/2017:  Afebrile  Mild leukocytosis- trending down, LA wnl  Hypotensive  CXR + mild PNA  O2 sats 70% overnight and transferred to ICU  Plan: Vancomycin 1gm IV x1 now then, Vancomycin 500mg  IV every 12 hours.  Goal trough 15-20 mcg/mL. Zosyn 3.375g IV q8h (4 hour infusion).  MRSA PCR negative- consider d/c Vancomycin  Daily Scr Check Vancomycin trough at steady state Monitor renal function and cx data   Height: 5\' 2"  (157.5 cm) Weight: 156 lb 8.4 oz (71 kg) IBW/kg (Calculated) : 50.1  Temp (24hrs), Avg:98.1 F (36.7 C), Min:97.4 F (36.3 C), Max:98.5 F (36.9 C)   Recent Labs Lab 01/27/17 1918 01/28/17 0523  WBC 13.0* 12.1*  CREATININE 1.12* 1.07*  LATICACIDVEN 1.7  --     Estimated Creatinine Clearance: 40.7 mL/min (A) (by C-G formula based on SCr of 1.07 mg/dL (H)).    Allergies  Allergen Reactions  . Demerol [Meperidine] Anaphylaxis    Antimicrobials this admission: 6/2 Zithromax x1  6/2 Vanc >>  6/2 Zosyn >>   Dose adjustments this admission:  Microbiology results: 6/1 UCx: sent  6/2 MRSA PCR: negative  Thank you for allowing pharmacy to be a part of this patient's care.  Junita PushMichelle Rosland Riding, PharmD, BCPS Pager: [phone] 01/28/2017 10:05 AM

## 2017-01-28 NOTE — Significant Event (Signed)
Rapid Response Event Note  Overview: pt has AMS.      Initial Focused Assessment: Upon entering pt room she was very lethargic.  Pt was able to follow very simple commands.  Pupils equal and reactive, round size 2.  Pt had multiple nurses at the bedside as I arrived.  Also, MD  shortly arrived at the bedside.  Pt on NRB sats 100%.     Interventions:  Pt placed on monitor.  EkG completed and given to MD.  VS intact, see flowsheet.  ABG collected per RT.  Pt lungs decreased and some crackles in bases .   Protocol reviewed and applied.  MD placed new orders, as well.  Plan of Care (if not transferred): Pt transferred to Unit room 1238.  VSS.  At 06:19 am for BIPAP  Event Summary:   at      at          [facility] [place]

## 2017-01-28 NOTE — Addendum Note (Signed)
Addendum  created 01/28/17 1229 by Heather RobertsSinger, Stephone Gum, MD   Anesthesia Intra Blocks edited, Sign clinical note

## 2017-01-28 NOTE — Progress Notes (Signed)
Patient ID: Beth Kerr, female   DOB: 05/26/1941, 76 y.o.   MRN: [id number]                                                                PROGRESS NOTE                                                                                                                                                                                                             Patient Demographics:    Beth Kerr, is a 76 y.o. female, DOB - [birth year], QMV:[id number]  Admit date - 01/27/2017   Admitting Physician Therisa Doyne, MD  Outpatient Primary MD for the patient is [name], MD  LOS - 0  Outpatient Specialists:     Chief Complaint  Patient presents with  . Fall  . Loss of Consciousness       Brief Narrative  76 y.o. female with medical history significant of DM2, HTN, COPD    Presented with a fall 4 nights ago she was unable to get up since her fall and spent 4 days in the bathroom. Coworker did not find her at work and became concerned and called the mass who had to break into her house to find her. She had had a chest pain shortness of breath she is unsure how she fell denies any recent nausea vomiting or diarrhea. Denies any neurological complaints but she apparently has lost track of time. She was found to be hypoxic down to 73% while in emergency part  [place] and that is the last thing that she remember she has no recollection of falling or being on the floor. She denies any prior events. No recent change in medications, She usually does not check her sugar. No recent hypoglycemia events that she remembers. No chest pain no shortness of breath.   Regarding pertinent Chronic problems: Diabetes mellitus on by mouth medications.    Admission for dehydration, rhabdo (mild)    Subjective:    Beth Kerr today has acute respiratory failure  Pox 70's,  Pt was placed on NR and rapid response called.  Pt is still lethargic and unable to give much  history.     Assessment  & Plan :    Active Problems:  COPD with chronic bronchitis (HCC)   Rhabdomyolysis   Elevated troponin   Dehydration   DM (diabetes mellitus), type 2 with renal complications (HCC)   Leukocytosis   Hypoxic   Syncope and collapse   Global amnesia   Acute respiratory failure, suspect Copd exacerbation CXR portable stat Abg  12 lead ekg Trop I q6hx3 Solumedrol 125mg  iv x1 Zithromax iv x1 Continue duoneb q6h Lasix 20mg  iv x1 May need Bipap Transfer to stepdown   Code Status : FULL CODE  Family Communication  :    w grandson  Disposition Plan  :  Stepdown  Barriers For Discharge :   Consults  :    Procedures  : CXR portable   DVT Prophylaxis  :   SCDs   Lab Results  Component Value Date   PLT 167 01/28/2017    Antibiotics  :  zithromax 6/2  Anti-infectives    None        Objective:   Vitals:   01/27/17 2349 01/28/17 0532 01/28/17 0543 01/28/17 0546  BP: (!) 115/47 (!) 125/49  (!) 114/55  Pulse: 88 90    Resp: (!) 22 20  (!) 21  Temp: 98.5 F (36.9 C) 98 F (36.7 C) 98.1 F (36.7 C)   TempSrc: Axillary Axillary Axillary   SpO2: 92% 97% 100% 100%  Weight: 70.5 kg (155 lb 6.8 oz)     Height: 5\' 2"  (1.575 m)       Wt Readings from Last 3 Encounters:  01/27/17 70.5 kg (155 lb 6.8 oz)     Intake/Output Summary (Last 24 hours) at 01/28/17 0550 Last data filed at 01/28/17 0200  Gross per 24 hour  Intake          2473.34 ml  Output                0 ml  Net          2473.34 ml     Physical Exam  Awake , reponds to painful stimuli. Opens eyes briefly.  No new F.N deficits, Normal affect Anderson.AT,PERRAL Supple Neck,No JVD, No cervical lymphadenopathy appriciated.  Symmetrical Chest wall movement, Good air movement bilaterally, slight crackle bil base RRR,No Gallops,Rubs or new Murmurs, No Parasternal Heave +ve B.Sounds, Abd Soft, No tenderness, No organomegaly appriciated, No rebound - guarding or rigidity. No  Cyanosis, Clubbing or edema, No new Rash or bruise      Data Review:    CBC  Recent Labs Lab 01/27/17 1918 01/28/17 0523  WBC 13.0* 12.1*  HGB 13.7 12.4  HCT 43.9 41.4  PLT 209 167  MCV 102.1* 104.8*  MCH 31.9 31.4  MCHC 31.2 30.0  RDW 14.6 15.1  LYMPHSABS 0.8  --   MONOABS 0.7  --   EOSABS 0.0  --   BASOSABS 0.0  --     Chemistries   Recent Labs Lab 01/27/17 1918  NA 146*  K 3.7  CL 102  CO2 31  GLUCOSE 198*  BUN 48*  CREATININE 1.12*  CALCIUM 8.7*  AST 41  ALT 19  ALKPHOS 43  BILITOT 0.6   ------------------------------------------------------------------------------------------------------------------ No results for input(s): CHOL, HDL, LDLCALC, TRIG, CHOLHDL, LDLDIRECT in the last 72 hours.  No results found for: HGBA1C ------------------------------------------------------------------------------------------------------------------ No results for input(s): TSH, T4TOTAL, T3FREE, THYROIDAB in the last 72 hours.  Invalid input(s): FREET3 ------------------------------------------------------------------------------------------------------------------ No results for input(s): VITAMINB12, FOLATE, FERRITIN, TIBC, IRON, RETICCTPCT in the last 72 hours.  Coagulation profile No results for input(s):  INR, PROTIME in the last 168 hours.   Recent Labs  01/27/17 2143  DDIMER 3.97*    Cardiac Enzymes  Recent Labs Lab 01/27/17 1918 01/27/17 2143  TROPONINI 0.09* 0.07*   ------------------------------------------------------------------------------------------------------------------ No results found for: BNP  Inpatient Medications  Scheduled Meds: . aspirin EC  81 mg Oral Daily  . insulin aspart  0-9 Units Subcutaneous Q4H  . iopamidol      . rosuvastatin  10 mg Oral QPM  . sodium chloride flush  3 mL Intravenous Q12H   Continuous Infusions: . sodium chloride 100 mL/hr at 01/27/17 2323  . sodium chloride 100 mL/hr at 01/27/17 2353  .  sodium chloride     PRN Meds:.acetaminophen **OR** acetaminophen, albuterol, ondansetron **OR** ondansetron (ZOFRAN) IV, polyethylene glycol  Micro Results No results found for this or any previous visit (from the past 240 hour(s)).  Radiology Reports Dg Chest 2 View  Result Date: 01/27/2017 CLINICAL DATA:  Fall and chest pain EXAM: CHEST  2 VIEW COMPARISON:  06/27/2016 FINDINGS: Minimal atelectasis at the left lung base. Slight increased apical opacity on the right. Normal heart size. No pneumothorax. Degenerative changes of the right shoulder with calcific tendinitis. IMPRESSION: 1. Subtle increased density in the apical portion of the right upper lobe, may be artifactual and related to positioning and summation artifact, however short interval radiographic follow-up recommended to exclude parenchymal opacity in the region. 2. Minimal linear atelectasis at the left base. Electronically Signed   By: Jasmine PangKim  Fujinaga M.D.   On: 01/27/2017 19:06   Ct Head Wo Contrast  Result Date: 01/27/2017 CLINICAL DATA:  Patient fell this past Monday with questionable loss of consciousness. EXAM: CT HEAD WITHOUT CONTRAST TECHNIQUE: Contiguous axial images were obtained from the base of the skull through the vertex without intravenous contrast. COMPARISON:  None. FINDINGS: Brain: No evidence of acute infarction, hemorrhage, hydrocephalus, extra-axial collection or mass lesion/mass effect. Vascular: Moderate carotid siphon calcifications bilaterally. No hyperdense vessels. Skull: No fracture or primary bone lesions. Sinuses/Orbits: Clear mastoids and paranasal sinuses. Intact orbits and globes. Bilateral lens replacements surgeries. Other: Mild left posterior parietal scalp contusion. IMPRESSION: No acute intracranial abnormality. Left posterior parietal scalp contusion. Electronically Signed   By: Tollie Ethavid  Kwon M.D.   On: 01/27/2017 18:54   Ct Angio Chest Pe W Or Wo Contrast  Result Date: 01/28/2017 CLINICAL DATA:   Hypoxic event EXAM: CT ANGIOGRAPHY CHEST WITH CONTRAST TECHNIQUE: Multidetector CT imaging of the chest was performed using the standard protocol during bolus administration of intravenous contrast. Multiplanar CT image reconstructions and MIPs were obtained to evaluate the vascular anatomy. CONTRAST:  100 mL Isovue 370 intravenous COMPARISON:  Radiograph 01/27/2017 FINDINGS: Cardiovascular: Satisfactory opacification of the pulmonary arteries to the segmental level. No evidence of pulmonary embolism. Non aneurysmal aorta. Atherosclerotic calcifications. Coronary artery calcifications. Normal heart size. No pericardial effusion. Mediastinum/Nodes: Esophagus within normal limits. Midline trachea. No thyroid mass. No significantly enlarged mediastinal lymph nodes. Lungs/Pleura: Mild emphysema within the upper lobes. Small area of focal consolidation in the lingula may reflect atelectasis or a small infiltrate. Trace pleural effusions. Upper Abdomen: 13 mm left adrenal gland nodule. Diffuse nodularity of the right adrenal gland partially visualized. Musculoskeletal: Mild compression of of T7. Review of the MIP images confirms the above findings. IMPRESSION: 1. Negative for acute pulmonary embolus. 2. Small focal consolidation in the lingula may reflect atelectasis or small infiltrate. There are trace effusions 3. Mild emphysema 4. Indeterminate 13 mm left adrenal gland nodule partially visualized. Diffuse nodularity  of the right adrenal gland. Further evaluation with adrenal CT is suggested. Electronically Signed   By: Jasmine Pang M.D.   On: 01/28/2017 03:38    Time Spent in minutes  30 critical care time   [name] M.D on 01/28/2017 at 5:50 AM  Between 7am to 7pm - Pager - [phone]  After 7pm go to [email or web address] - password [facility]  [facility] -  Office  [phone]

## 2017-01-28 NOTE — Progress Notes (Signed)
Continuous pulse ox reading found to be 70% on 3L/[place]. Pt increasing more lethargic. Difficulty to arouse with sternal rub. Placed on NR. See VS flowsheet. Rapid Response called. Dr Clyde LundborgNiu paged.

## 2017-01-28 NOTE — Progress Notes (Addendum)
ABG was repeated while patient on BiPAP. pH 7.15, PCO 91.5 while on BiPAP. Patient continues to be lethargic. Follows some commands  Spoke with patients grandson again, explained that she needs intubation, which may be temporary, but we can't proceed with patient been DNR. He feels that given prior to this event she was very independent and actives, she should get a chance for recovery. He ask to reverse DNR orders for now. Will proceed with intubation.  A&P Acute respiratory failure with hypoxia and hypercapnia - from possible aspiration PNA, ? ARDS given rhabdo. Patient with hx of COPD  Intubate, get ABG 30 min post intubation to adjust settings Repeated CXR no acute findings  Continue brad spectrum abx for now  Please get MRI after intubation Continue IVF  PCCM will asume care  Patient is FULL CODE for now   Critical time spent: 45 minutes   Latrelle DodrillEdwin Silva, MD

## 2017-01-28 NOTE — Consult Note (Addendum)
Reason for Consult: Acute hypercapnic respiratory failure Referring Physician: Dr. [name] Simmonds zapata Beth Kerr is an 76 y.o. female.   HPI:   This is a 76 year old female with past medical history of diabetes hypertension and COPD who presented initially yesterday after having a fall 4 nights ago at home. She spent 4 days in the bathroom. When the coworker did not find her at work, called for help and patient was found down in the bathroom. She has been in the ICU during her stay she has gotten progressively more hypoxic and hypercapnic failing BiPAP so required intubation.  When I came patient was already intubated and could not get any further history from the patient. History is entirely obtained from medical records and talking to the hospitalist.  Unable to collect history regarding her tobacco use and any home inhalers for her COPD or oxygen use.  Past Medical History:  Diagnosis Date  . Diabetes mellitus without complication ([place])   . Emphysema/COPD ([place])   . Hyperlipidemia   . Hypertension     Past Surgical History:  Procedure Laterality Date  . DILATION AND CURETTAGE OF UTERUS    . TONSILLECTOMY      Family History  Problem Relation Age of Onset  . CAD Father   . Lung cancer Other   . Diabetes Neg Hx   . Stroke Neg Hx     Social History:  reports that she has quit smoking. She has never used smokeless tobacco. She reports that she does not drink alcohol or use drugs.  Allergies:  Allergies  Allergen Reactions  . Demerol [Meperidine] Anaphylaxis    Medications: I have reviewed the patient's current medications.  Results for orders placed or performed during the hospital encounter of 01/27/17 (from the past 48 hour(s))  Urinalysis, Routine w reflex microscopic     Status: Abnormal   Collection Time: 01/27/17  5:51 PM  Result Value Ref Range   Color, Urine YELLOW YELLOW   APPearance HAZY (A) CLEAR   Specific Gravity, Urine 1.020 1.005 - 1.030   pH 5.0 5.0 -  8.0   Glucose, UA 50 (A) NEGATIVE mg/dL   Hgb urine dipstick MODERATE (A) NEGATIVE   Bilirubin Urine NEGATIVE NEGATIVE   Ketones, ur 5 (A) NEGATIVE mg/dL   Protein, ur 100 (A) NEGATIVE mg/dL   Nitrite NEGATIVE NEGATIVE   Leukocytes, UA NEGATIVE NEGATIVE   RBC / HPF TOO NUMEROUS TO COUNT 0 - 5 RBC/hpf   WBC, UA 0-5 0 - 5 WBC/hpf   Bacteria, UA RARE (A) NONE SEEN   Squamous Epithelial / LPF 0-5 (A) NONE SEEN   Mucous PRESENT   CBG monitoring, ED     Status: Abnormal   Collection Time: 01/27/17  6:02 PM  Result Value Ref Range   Glucose-Capillary 173 (H) 65 - 99 mg/dL   Comment 1 Notify RN    Comment 2 Document in Chart   Comprehensive metabolic panel     Status: Abnormal   Collection Time: 01/27/17  7:18 PM  Result Value Ref Range   Sodium 146 (H) 135 - 145 mmol/L   Potassium 3.7 3.5 - 5.1 mmol/L   Chloride 102 101 - 111 mmol/L   CO2 31 22 - 32 mmol/L   Glucose, Bld 198 (H) 65 - 99 mg/dL   BUN 48 (H) 6 - 20 mg/dL   Creatinine, Ser 1.12 (H) 0.44 - 1.00 mg/dL   Calcium 8.7 (L) 8.9 - 10.3 mg/dL   Total Protein 7.3  6.5 - 8.1 g/dL   Albumin 3.8 3.5 - 5.0 g/dL   AST 41 15 - 41 U/L   ALT 19 14 - 54 U/L   Alkaline Phosphatase 43 38 - 126 U/L   Total Bilirubin 0.6 0.3 - 1.2 mg/dL   GFR calc non Af Amer 46 (L) >60 mL/min   GFR calc Af Amer 53 (L) >60 mL/min    Comment: (NOTE) The eGFR has been calculated using the CKD EPI equation. This calculation has not been validated in all clinical situations. eGFR's persistently <60 mL/min signify possible Chronic Kidney Disease.    Anion gap 13 5 - 15  Troponin I     Status: Abnormal   Collection Time: 01/27/17  7:18 PM  Result Value Ref Range   Troponin I 0.09 (HH) <0.03 ng/mL    Comment: CRITICAL RESULT CALLED TO, READ BACK BY AND VERIFIED WITH: J.Everlean Patterson 2039 01/27/17 W.SHEA   Lactic acid, plasma     Status: None   Collection Time: 01/27/17  7:18 PM  Result Value Ref Range   Lactic Acid, Venous 1.7 0.5 - 1.9 mmol/L  CBC with  Differential     Status: Abnormal   Collection Time: 01/27/17  7:18 PM  Result Value Ref Range   WBC 13.0 (H) 4.0 - 10.5 K/uL   RBC 4.30 3.87 - 5.11 MIL/uL   Hemoglobin 13.7 12.0 - 15.0 g/dL   HCT 43.9 36.0 - 46.0 %   MCV 102.1 (H) 78.0 - 100.0 fL   MCH 31.9 26.0 - 34.0 pg   MCHC 31.2 30.0 - 36.0 g/dL   RDW 14.6 11.5 - 15.5 %   Platelets 209 150 - 400 K/uL   Neutrophils Relative % 89 %   Neutro Abs 11.5 (H) 1.7 - 7.7 K/uL   Lymphocytes Relative 6 %   Lymphs Abs 0.8 0.7 - 4.0 K/uL   Monocytes Relative 5 %   Monocytes Absolute 0.7 0.1 - 1.0 K/uL   Eosinophils Relative 0 %   Eosinophils Absolute 0.0 0.0 - 0.7 K/uL   Basophils Relative 0 %   Basophils Absolute 0.0 0.0 - 0.1 K/uL  CK     Status: Abnormal   Collection Time: 01/27/17  7:18 PM  Result Value Ref Range   Total CK 1,612 (H) 38 - 234 U/L  Troponin I (q 6hr x 3)     Status: Abnormal   Collection Time: 01/27/17  9:43 PM  Result Value Ref Range   Troponin I 0.07 (HH) <0.03 ng/mL    Comment: CRITICAL VALUE NOTED.  VALUE IS CONSISTENT WITH PREVIOUSLY REPORTED AND CALLED VALUE.  D-dimer, quantitative (not at [facility])     Status: Abnormal   Collection Time: 01/27/17  9:43 PM  Result Value Ref Range   D-Dimer, Quant 3.97 (H) 0.00 - 0.50 ug/mL-FEU    Comment: (NOTE) At the manufacturer cut-off of 0.50 ug/mL FEU, this assay has been documented to exclude PE with a sensitivity and negative predictive value of 97 to 99%.  At this time, this assay has not been approved by the FDA to exclude DVT/VTE. Results should be correlated with clinical presentation.   Glucose, capillary     Status: Abnormal   Collection Time: 01/28/17  5:16 AM  Result Value Ref Range   Glucose-Capillary 185 (H) 65 - 99 mg/dL  Troponin I (q 6hr x 3)     Status: Abnormal   Collection Time: 01/28/17  5:23 AM  Result Value Ref Range   Troponin  I 0.08 (HH) <0.03 ng/mL    Comment: CRITICAL VALUE NOTED.  VALUE IS CONSISTENT WITH PREVIOUSLY REPORTED AND CALLED  VALUE.  Magnesium     Status: None   Collection Time: 01/28/17  5:23 AM  Result Value Ref Range   Magnesium 2.3 1.7 - 2.4 mg/dL  Phosphorus     Status: Abnormal   Collection Time: 01/28/17  5:23 AM  Result Value Ref Range   Phosphorus 5.1 (H) 2.5 - 4.6 mg/dL  TSH     Status: None   Collection Time: 01/28/17  5:23 AM  Result Value Ref Range   TSH 2.022 0.350 - 4.500 uIU/mL    Comment: Performed by a 3rd Generation assay with a functional sensitivity of <=0.01 uIU/mL.  Comprehensive metabolic panel     Status: Abnormal   Collection Time: 01/28/17  5:23 AM  Result Value Ref Range   Sodium 146 (H) 135 - 145 mmol/L   Potassium 4.3 3.5 - 5.1 mmol/L   Chloride 106 101 - 111 mmol/L   CO2 32 22 - 32 mmol/L   Glucose, Bld 189 (H) 65 - 99 mg/dL   BUN 48 (H) 6 - 20 mg/dL   Creatinine, Ser 1.07 (H) 0.44 - 1.00 mg/dL   Calcium 8.2 (L) 8.9 - 10.3 mg/dL   Total Protein 6.7 6.5 - 8.1 g/dL   Albumin 3.2 (L) 3.5 - 5.0 g/dL   AST 31 15 - 41 U/L   ALT 17 14 - 54 U/L   Alkaline Phosphatase 39 38 - 126 U/L   Total Bilirubin 0.4 0.3 - 1.2 mg/dL   GFR calc non Af Amer 49 (L) >60 mL/min   GFR calc Af Amer 57 (L) >60 mL/min    Comment: (NOTE) The eGFR has been calculated using the CKD EPI equation. This calculation has not been validated in all clinical situations. eGFR's persistently <60 mL/min signify possible Chronic Kidney Disease.    Anion gap 8 5 - 15  CBC     Status: Abnormal   Collection Time: 01/28/17  5:23 AM  Result Value Ref Range   WBC 12.1 (H) 4.0 - 10.5 K/uL   RBC 3.95 3.87 - 5.11 MIL/uL   Hemoglobin 12.4 12.0 - 15.0 g/dL   HCT 41.4 36.0 - 46.0 %   MCV 104.8 (H) 78.0 - 100.0 fL   MCH 31.4 26.0 - 34.0 pg   MCHC 30.0 30.0 - 36.0 g/dL   RDW 15.1 11.5 - 15.5 %   Platelets 167 150 - 400 K/uL  CK     Status: Abnormal   Collection Time: 01/28/17  5:23 AM  Result Value Ref Range   Total CK 913 (H) 38 - 234 U/L  Blood gas, arterial     Status: Abnormal   Collection Time: 01/28/17   5:45 AM  Result Value Ref Range   FIO2 100.00    O2 Content 15.0 L/min   Delivery systems NON-REBREATHER OXYGEN MASK    LHR 16 resp/min   pH, Arterial 7.205 (L) 7.350 - 7.450   pCO2 arterial 82.9 (HH) 32.0 - 48.0 mmHg    Comment: CRITICAL RESULT CALLED TO, READ BACK BY AND VERIFIED WITH: Jani Gravel, MD '@0600'  BY PATRICK SWEENEY RRT, RCP ON 01/28/2017    pO2, Arterial 397 (H) 83.0 - 108.0 mmHg   Bicarbonate 31.7 (H) 20.0 - 28.0 mmol/L   Acid-Base Excess 1.6 0.0 - 2.0 mmol/L   O2 Saturation 99.6 %   Patient temperature 98.1    Collection  site RIGHT RADIAL    Drawn by [phone]    Sample type ARTERIAL    Allens test (pass/fail) PASS PASS  MRSA PCR Screening     Status: None   Collection Time: 01/28/17  6:52 AM  Result Value Ref Range   MRSA by PCR NEGATIVE NEGATIVE    Comment:        The GeneXpert MRSA Assay (FDA approved for NASAL specimens only), is one component of a comprehensive MRSA colonization surveillance program. It is not intended to diagnose MRSA infection nor to guide or monitor treatment for MRSA infections.   Glucose, capillary     Status: Abnormal   Collection Time: 01/28/17  7:58 AM  Result Value Ref Range   Glucose-Capillary 200 (H) 65 - 99 mg/dL   Comment 1 Notify RN    Comment 2 Document in Chart   Blood gas, arterial     Status: Abnormal   Collection Time: 01/28/17 10:40 AM  Result Value Ref Range   FIO2 1.00    Delivery systems NONREBREATHER    pH, Arterial 7.152 (LL) 7.350 - 7.450    Comment: CRITICAL RESULT CALLED TO, READ BACK BY AND VERIFIED WITH: DR Chase Caller BY ANGIE DUNLAP RRT RCP ON 01/28/17 AT 1043    pCO2 arterial 91.5 (HH) 32.0 - 48.0 mmHg    Comment: CRITICAL RESULT CALLED TO, READ BACK BY AND VERIFIED WITH: DR Geoffery Spruce BY ANGIE DUNLAP RRT RCP ON 01/28/17 AT 1043    pO2, Arterial 275 (H) 83.0 - 108.0 mmHg   Bicarbonate 30.7 (H) 20.0 - 28.0 mmol/L   Acid-base deficit 0.5 0.0 - 2.0 mmol/L   O2 Saturation 99.3 %   Patient temperature  98.6    Collection site RIGHT RADIAL    Drawn by [id number]    Sample type ARTERIAL DRAW    Allens test (pass/fail) PASS PASS  Troponin I (q 6hr x 3)     Status: Abnormal   Collection Time: 01/28/17 11:14 AM  Result Value Ref Range   Troponin I 0.05 (HH) <0.03 ng/mL    Comment: CRITICAL VALUE NOTED.  VALUE IS CONSISTENT WITH PREVIOUSLY REPORTED AND CALLED VALUE.    Dg Chest 2 View  Result Date: 01/27/2017 CLINICAL DATA:  Fall and chest pain EXAM: CHEST  2 VIEW COMPARISON:  06/27/2016 FINDINGS: Minimal atelectasis at the left lung base. Slight increased apical opacity on the right. Normal heart size. No pneumothorax. Degenerative changes of the right shoulder with calcific tendinitis. IMPRESSION: 1. Subtle increased density in the apical portion of the right upper lobe, may be artifactual and related to positioning and summation artifact, however short interval radiographic follow-up recommended to exclude parenchymal opacity in the region. 2. Minimal linear atelectasis at the left base. Electronically Signed   By: Donavan Foil M.D.   On: 01/27/2017 19:06   Ct Head Wo Contrast  Result Date: 01/27/2017 CLINICAL DATA:  Patient fell this past Monday with questionable loss of consciousness. EXAM: CT HEAD WITHOUT CONTRAST TECHNIQUE: Contiguous axial images were obtained from the base of the skull through the vertex without intravenous contrast. COMPARISON:  None. FINDINGS: Brain: No evidence of acute infarction, hemorrhage, hydrocephalus, extra-axial collection or mass lesion/mass effect. Vascular: Moderate carotid siphon calcifications bilaterally. No hyperdense vessels. Skull: No fracture or primary bone lesions. Sinuses/Orbits: Clear mastoids and paranasal sinuses. Intact orbits and globes. Bilateral lens replacements surgeries. Other: Mild left posterior parietal scalp contusion. IMPRESSION: No acute intracranial abnormality. Left posterior parietal scalp contusion. Electronically Signed   By:  Ashley Royalty  M.D.   On: 01/27/2017 18:54   Ct Angio Chest Pe W Or Wo Contrast  Result Date: 01/28/2017 CLINICAL DATA:  Hypoxic event EXAM: CT ANGIOGRAPHY CHEST WITH CONTRAST TECHNIQUE: Multidetector CT imaging of the chest was performed using the standard protocol during bolus administration of intravenous contrast. Multiplanar CT image reconstructions and MIPs were obtained to evaluate the vascular anatomy. CONTRAST:  100 mL Isovue 370 intravenous COMPARISON:  Radiograph 01/27/2017 FINDINGS: Cardiovascular: Satisfactory opacification of the pulmonary arteries to the segmental level. No evidence of pulmonary embolism. Non aneurysmal aorta. Atherosclerotic calcifications. Coronary artery calcifications. Normal heart size. No pericardial effusion. Mediastinum/Nodes: Esophagus within normal limits. Midline trachea. No thyroid mass. No significantly enlarged mediastinal lymph nodes. Lungs/Pleura: Mild emphysema within the upper lobes. Small area of focal consolidation in the lingula may reflect atelectasis or a small infiltrate. Trace pleural effusions. Upper Abdomen: 13 mm left adrenal gland nodule. Diffuse nodularity of the right adrenal gland partially visualized. Musculoskeletal: Mild compression of of T7. Review of the MIP images confirms the above findings. IMPRESSION: 1. Negative for acute pulmonary embolus. 2. Small focal consolidation in the lingula may reflect atelectasis or small infiltrate. There are trace effusions 3. Mild emphysema 4. Indeterminate 13 mm left adrenal gland nodule partially visualized. Diffuse nodularity of the right adrenal gland. Further evaluation with adrenal CT is suggested. Electronically Signed   By: Donavan Foil M.D.   On: 01/28/2017 03:38   Dg Chest Port 1 View  Result Date: 01/28/2017 CLINICAL DATA:  Shortness of breath. EXAM: PORTABLE CHEST 1 VIEW COMPARISON:  Single-view of the chest and CT chest earlier today. FINDINGS: There is mild bibasilar atelectasis. The lungs are otherwise  clear. Heart size is normal. No pneumothorax. Aortic atherosclerosis noted. IMPRESSION: No acute disease. Electronically Signed   By: Inge Rise M.D.   On: 01/28/2017 11:13   Dg Chest Port 1 View  Result Date: 01/28/2017 CLINICAL DATA:  Acute onset of hypoxia.  Initial encounter. EXAM: PORTABLE CHEST 1 VIEW COMPARISON:  Chest radiograph performed 01/27/2017, and CTA of the chest performed earlier today at 3:15 a.m. FINDINGS: The lungs are well-aerated. Mild vascular congestion is noted. Mild bibasilar airspace opacities are noted, possibly reflecting atelectasis or mild pneumonia. This appears new at the right lung base. There is no evidence of pleural effusion or pneumothorax. The cardiomediastinal silhouette is borderline enlarged. No acute osseous abnormalities are seen. IMPRESSION: Mild vascular congestion and borderline cardiomegaly. Mild bibasilar airspace opacities may reflect atelectasis or mild pneumonia. Electronically Signed   By: Garald Balding M.D.   On: 01/28/2017 06:10    Review of Systems  Unable to perform ROS: Intubated   Blood pressure (!) 93/19, pulse 73, temperature 98.4 F (36.9 C), temperature source Oral, resp. rate 18, height '5\' 2"'  (1.575 m), weight 156 lb 8.4 oz (71 kg), SpO2 100 %. Physical Exam  Nursing note and vitals reviewed. Constitutional: She appears well-nourished. She appears distressed. She is intubated.  HENT:  Head: Normocephalic and atraumatic.  Eyes: Pupils are equal, round, and reactive to light.  Neck: Normal range of motion. Neck supple.  Cardiovascular: Tachycardia present.   Respiratory: Accessory muscle usage present. Tachypnea noted. She is intubated. She is in respiratory distress. She has decreased breath sounds in the right lower field and the left lower field.  GI: Soft. Bowel sounds are normal. She exhibits no distension. There is no tenderness.  Musculoskeletal: She exhibits no edema or tenderness.  Neurological:  When I saw her she  was just intubated so under the effect of etomidate. As a result not arousable or following commands.  Skin: Skin is warm. She is not diaphoretic.    Assessment/Plan: Acute hypercapnic respiratory failure COPD exacerbation Fall Loss of consciousness Diabetes type 2 Dehydration Syncope and collapse  Plan: Initial critical care protocols Proceed with intubation since failed BiPAP with worsening CO2 now that family has requested DO NOT RESUSCITATE.  Chest x-ray is clear, CT chest did not show any major infiltrate. No pulmonary embolism. Check respiratory viral panel. Initiate steroids every 6 around-the-clock. Duo nebs every 4 around-the-clock.  Gentle hydration. Use fentanyl drip for sedation and Precedex. Trying to avoid propofol due to soft blood pressure  I doubt patient has rhabdomyolysis since and rhabdomyolysis you have blood positive but RBC negative on urine analysis which is not the case here. Also usually CKs in 50-100,000 range in actual rhabdomyolysis.  MRI brain is ordered Echocardiogram is done but report is pending.  Ultrasound carotids did not reveal any critical stenosis.  GI negative prophylaxis.  CODE STATUS: DNR  Addendum : family is ok with intubation to see if she improves in few days but they do not want resuscitation or defibrillation in event of cardiac arrest.  STAFF NOTE: I, Sharia Reeve, MD have personally reviewed patient's available data, including medical history, events of note, physical examination and test results as part of my evaluation.  The patient is critically ill with multiple organ systems failure and requires high complexity decision making for assessment and support, frequent evaluation and titration of therapies, application of advanced monitoring technologies and extensive interpretation of multiple databases.   Critical Care Time devoted to patient care services described in this note is 45  Minutes. This time reflects time of care of  this signee: Sharia Reeve, MD.      Hyman Hopes Beth Kerr 01/28/2017, 12:20 PM

## 2017-01-28 NOTE — Progress Notes (Signed)
eLink Physician-Brief Progress Note Patient Name: Beth LaudBetty H Kerr DOB: 09/30/1940 MRN: [id number]   Date of Service  01/28/2017  HPI/Events of Note  ABG on 50%/PRVC 16/TV 350/P 5 = 7.27/65.4/96.1/29.  eICU Interventions  Will order: 1. Increase PRVC rate to 19. 2. ABG at 5:30 PM.     Intervention Category Major Interventions: Adrenal insufficiency - evaluation and management;Respiratory failure - evaluation and management  Sommer,Steven Eugene 01/28/2017, 4:22 PM

## 2017-01-29 ENCOUNTER — Inpatient Hospital Stay (HOSPITAL_COMMUNITY): Payer: Medicare Other

## 2017-01-29 DIAGNOSIS — G9340 Encephalopathy, unspecified: Secondary | ICD-10-CM

## 2017-01-29 DIAGNOSIS — J9602 Acute respiratory failure with hypercapnia: Secondary | ICD-10-CM

## 2017-01-29 DIAGNOSIS — R748 Abnormal levels of other serum enzymes: Secondary | ICD-10-CM

## 2017-01-29 DIAGNOSIS — R579 Shock, unspecified: Secondary | ICD-10-CM

## 2017-01-29 HISTORY — DX: Encephalopathy, unspecified: G93.40

## 2017-01-29 LAB — RESPIRATORY PANEL BY PCR
ADENOVIRUS-RVPPCR: NOT DETECTED
Bordetella pertussis: NOT DETECTED
CORONAVIRUS 229E-RVPPCR: NOT DETECTED
CORONAVIRUS OC43-RVPPCR: NOT DETECTED
Chlamydophila pneumoniae: NOT DETECTED
Coronavirus HKU1: NOT DETECTED
Coronavirus NL63: NOT DETECTED
INFLUENZA B-RVPPCR: NOT DETECTED
Influenza A: NOT DETECTED
MYCOPLASMA PNEUMONIAE-RVPPCR: NOT DETECTED
Metapneumovirus: NOT DETECTED
PARAINFLUENZA VIRUS 1-RVPPCR: NOT DETECTED
Parainfluenza Virus 2: NOT DETECTED
Parainfluenza Virus 3: NOT DETECTED
Parainfluenza Virus 4: NOT DETECTED
RESPIRATORY SYNCYTIAL VIRUS-RVPPCR: NOT DETECTED
Rhinovirus / Enterovirus: NOT DETECTED

## 2017-01-29 LAB — BASIC METABOLIC PANEL
Anion gap: 7 (ref 5–15)
BUN: 58 mg/dL — AB (ref 6–20)
CALCIUM: 7.8 mg/dL — AB (ref 8.9–10.3)
CO2: 28 mmol/L (ref 22–32)
CREATININE: 1.04 mg/dL — AB (ref 0.44–1.00)
Chloride: 111 mmol/L (ref 101–111)
GFR calc non Af Amer: 50 mL/min — ABNORMAL LOW (ref >60)
GFR, EST AFRICAN AMERICAN: 59 mL/min — AB (ref >60)
Glucose, Bld: 225 mg/dL — ABNORMAL HIGH (ref 65–99)
Potassium: 3.9 mmol/L (ref 3.5–5.1)
SODIUM: 146 mmol/L — AB (ref 135–145)

## 2017-01-29 LAB — BLOOD GAS, ARTERIAL
Acid-Base Excess: 1.6 mmol/L (ref 0.0–2.0)
BICARBONATE: 27.2 mmol/L (ref 20.0–28.0)
Drawn by: 308601
FIO2: 40
LHR: 19 {breaths}/min
MECHVT: 350 mL
O2 Saturation: 98.6 %
PATIENT TEMPERATURE: 97.6
PCO2 ART: 48.2 mmHg — AB (ref 32.0–48.0)
PEEP: 5 cmH2O
PO2 ART: 123 mmHg — AB (ref 83.0–108.0)
pH, Arterial: 7.366 (ref 7.350–7.450)

## 2017-01-29 LAB — CBC
HEMATOCRIT: 38.8 % (ref 36.0–46.0)
Hemoglobin: 11.8 g/dL — ABNORMAL LOW (ref 12.0–15.0)
MCH: 31.5 pg (ref 26.0–34.0)
MCHC: 30.4 g/dL (ref 30.0–36.0)
MCV: 103.5 fL — ABNORMAL HIGH (ref 78.0–100.0)
Platelets: 241 10*3/uL (ref 150–400)
RBC: 3.75 MIL/uL — ABNORMAL LOW (ref 3.87–5.11)
RDW: 15.1 % (ref 11.5–15.5)
WBC: 14.7 10*3/uL — ABNORMAL HIGH (ref 4.0–10.5)

## 2017-01-29 LAB — VAS US CAROTID
LCCADSYS: 91 cm/s
LEFT ECA DIAS: 5 cm/s
LEFT VERTEBRAL DIAS: 20 cm/s
LICAPDIAS: 35 cm/s
Left CCA dist dias: 22 cm/s
Left CCA prox dias: 22 cm/s
Left CCA prox sys: 121 cm/s
Left ICA dist dias: -25 cm/s
Left ICA dist sys: -125 cm/s
Left ICA prox sys: 143 cm/s
RCCADSYS: -127 cm/s
RCCAPDIAS: 20 cm/s
RIGHT ECA DIAS: 7 cm/s
RIGHT VERTEBRAL DIAS: 21 cm/s
Right CCA prox sys: 122 cm/s

## 2017-01-29 LAB — URINE CULTURE: CULTURE: NO GROWTH

## 2017-01-29 LAB — GLUCOSE, CAPILLARY
GLUCOSE-CAPILLARY: 185 mg/dL — AB (ref 65–99)
GLUCOSE-CAPILLARY: 201 mg/dL — AB (ref 65–99)
GLUCOSE-CAPILLARY: 201 mg/dL — AB (ref 65–99)
GLUCOSE-CAPILLARY: 203 mg/dL — AB (ref 65–99)
GLUCOSE-CAPILLARY: 211 mg/dL — AB (ref 65–99)
Glucose-Capillary: 230 mg/dL — ABNORMAL HIGH (ref 65–99)
Glucose-Capillary: 230 mg/dL — ABNORMAL HIGH (ref 65–99)
Glucose-Capillary: 250 mg/dL — ABNORMAL HIGH (ref 65–99)

## 2017-01-29 LAB — CK: CK TOTAL: 284 U/L — AB (ref 38–234)

## 2017-01-29 LAB — TRIGLYCERIDES: Triglycerides: 153 mg/dL — ABNORMAL HIGH (ref <150)

## 2017-01-29 LAB — MAGNESIUM: MAGNESIUM: 2.3 mg/dL (ref 1.7–2.4)

## 2017-01-29 LAB — PHOSPHORUS: PHOSPHORUS: 3.9 mg/dL (ref 2.5–4.6)

## 2017-01-29 MED ORDER — IPRATROPIUM-ALBUTEROL 0.5-2.5 (3) MG/3ML IN SOLN
3.0000 mL | Freq: Four times a day (QID) | RESPIRATORY_TRACT | Status: DC
  Administered 2017-01-29 – 2017-01-31 (×6): 3 mL via RESPIRATORY_TRACT
  Filled 2017-01-29 (×5): qty 3
  Filled 2017-01-29: qty 30
  Filled 2017-01-29: qty 3

## 2017-01-29 MED ORDER — INSULIN ASPART 100 UNIT/ML ~~LOC~~ SOLN
0.0000 [IU] | SUBCUTANEOUS | Status: DC
Start: 2017-01-29 — End: 2017-02-03
  Administered 2017-01-29 – 2017-01-30 (×4): 7 [IU] via SUBCUTANEOUS
  Administered 2017-01-30 (×2): 4 [IU] via SUBCUTANEOUS
  Administered 2017-01-30: 7 [IU] via SUBCUTANEOUS
  Administered 2017-01-30: 11 [IU] via SUBCUTANEOUS
  Administered 2017-01-30 – 2017-01-31 (×4): 7 [IU] via SUBCUTANEOUS
  Administered 2017-01-31 (×2): 4 [IU] via SUBCUTANEOUS
  Administered 2017-02-01 (×2): 3 [IU] via SUBCUTANEOUS
  Administered 2017-02-01: 4 [IU] via SUBCUTANEOUS
  Administered 2017-02-01: 7 [IU] via SUBCUTANEOUS
  Administered 2017-02-01: 4 [IU] via SUBCUTANEOUS
  Administered 2017-02-01: 3 [IU] via SUBCUTANEOUS
  Administered 2017-02-02: 4 [IU] via SUBCUTANEOUS
  Administered 2017-02-02: 3 [IU] via SUBCUTANEOUS
  Administered 2017-02-02: 4 [IU] via SUBCUTANEOUS
  Administered 2017-02-02: 7 [IU] via SUBCUTANEOUS
  Administered 2017-02-02: 3 [IU] via SUBCUTANEOUS
  Administered 2017-02-03 (×2): 4 [IU] via SUBCUTANEOUS
  Administered 2017-02-03: 3 [IU] via SUBCUTANEOUS

## 2017-01-29 MED ORDER — SODIUM CHLORIDE 0.9 % IV BOLUS (SEPSIS)
500.0000 mL | Freq: Once | INTRAVENOUS | Status: AC
  Administered 2017-01-29: 500 mL via INTRAVENOUS

## 2017-01-29 MED ORDER — PROPOFOL 1000 MG/100ML IV EMUL: 5.0000 ug/kg/min | INTRAVENOUS | Status: DC

## 2017-01-29 MED ORDER — INSULIN REGULAR HUMAN 100 UNIT/ML IJ SOLN
INTRAMUSCULAR | Status: DC
  Filled 2017-01-29: qty 1

## 2017-01-29 MED ORDER — FENTANYL CITRATE (PF) 100 MCG/2ML IJ SOLN
50.0000 ug | INTRAMUSCULAR | Status: DC | PRN
  Administered 2017-01-29 – 2017-01-30 (×5): 100 ug via INTRAVENOUS
  Administered 2017-01-30: 50 ug via INTRAVENOUS
  Filled 2017-01-29 (×7): qty 2

## 2017-01-29 MED ORDER — VITAL HIGH PROTEIN PO LIQD
1000.0000 mL | ORAL | Status: DC
  Administered 2017-01-29: 1000 mL
  Filled 2017-01-29 (×2): qty 1000

## 2017-01-29 NOTE — Assessment & Plan Note (Signed)
Type 2 an dvery mild  Plan Monitor Might need echo

## 2017-01-29 NOTE — Assessment & Plan Note (Signed)
Clinically better  Plan Continue steroids IV Continue nebs Continue abx  Await RVP  Anti-infectives    Start     Dose/Rate Route Frequency Ordered Stop   01/29/17 1000  vancomycin (VANCOCIN) 500 mg in sodium chloride 0.9 % 100 mL IVPB     500 mg 100 mL/hr over 60 Minutes Intravenous Every 12 hours 01/28/17 1023     01/29/17 0600  azithromycin (ZITHROMAX) 500 mg in dextrose 5 % 250 mL IVPB     500 mg 250 mL/hr over 60 Minutes Intravenous Every 24 hours 01/28/17 1217 02/02/17 0559   01/28/17 1200  piperacillin-tazobactam (ZOSYN) IVPB 3.375 g     3.375 g 12.5 mL/hr over 240 Minutes Intravenous Every 8 hours 01/28/17 1023     01/28/17 1100  vancomycin (VANCOCIN) IVPB 1000 mg/200 mL premix     1,000 mg 200 mL/hr over 60 Minutes Intravenous  Once 01/28/17 1023 01/28/17 1328   01/28/17 0600  azithromycin (ZITHROMAX) 500 mg in dextrose 5 % 250 mL IVPB     500 mg 250 mL/hr over 60 Minutes Intravenous  Once 01/28/17 0556 01/28/17 0800

## 2017-01-29 NOTE — Assessment & Plan Note (Signed)
Seems to be a low diat recording problem No evidence if air trapping Currently on neo 40  Plan Fluid bolus 500cc Continue neo but change bp goal to sbp > 100 and MAP goal >55

## 2017-01-29 NOTE — Progress Notes (Signed)
PT Cancellation Note  Patient Details Name: Beth Kerr MRN: [id number] DOB: 10-Sep-1940   Cancelled Treatment:    Reason Eval/Treat Not Completed: Patient not medically ready. On ventilator.   Sharen HeckHill, [name] PT [phone]  01/29/2017, 8:38 AM

## 2017-01-29 NOTE — Assessment & Plan Note (Signed)
ssi 

## 2017-01-29 NOTE — Assessment & Plan Note (Addendum)
S/p intubation 01/28/17  Clinically better 01/29/2017  PLAN PSV as tolerated 01/29/2017 but no extubation Full vent support otherwise - > reduce RR from19 to 12 for permissive hypercapnia Time ltd ventilator trial only per RN

## 2017-01-29 NOTE — Progress Notes (Signed)
PULMONARY / CRITICAL CARE MEDICINE   Name: Beth Kerr MRN: [id number] DOB: [birth year] PCP Pearson GrippeKim, James, MD LOS 1 as of 01/29/2017     ADMISSION DATE:  01/27/2017 CONSULTATION DATE:  01/28/17  REFERRING MD:  [facility] hospitalist Dr Edward JollySilva  CHIEF COMPLAINT:  Acute on hypercapnic resp failure  BRIEF 76 year old female with past medical history of diabetes hypertension and COPD who presented initially yesterday after having a fall 4 nights ago at home. She spent 4 days in the bathroom. When the coworker did not find her at work, called for help and patient was found down in the bathroom. She has been in the ICU during her stay she has gotten progressively more hypoxic and hypercapnic failing BiPAP so required intubation. Pmh xof DM, COPD, lipid and bp  EVENTS 01/27/2017 - admit 01/28/17 - VDRF after failing bipap and with hypercapnia. Reversal of code status   SUBJECTIVE/OVERNIGHT/INTERVAL HX 01/29/17 - WUA in progress of sedation gtt. Per RN - patient is no cpr and for time ltd trial of intubation for few to several days. On neo 40mcg gtt for MAP 58 but sbop 113. Low diastolic +. Doing PSV this am. RVP pending   VITAL SIGNS: BP (!) 104/42   Pulse 76   Temp 97.7 F (36.5 C) (Axillary)   Resp 20   Ht 5\' 2"  (1.575 m)   Wt 70.9 kg (156 lb 4.9 oz)   SpO2 100%   BMI 28.59 kg/m   HEMODYNAMICS:    VENTILATOR SETTINGS: Vent Mode: PRVC FiO2 (%):  [40 %-60 %] 40 % Set Rate:  [16 bmp-19 bmp] 19 bmp Vt Set:  [350 mL] 350 mL PEEP:  [5 cmH20] 5 cmH20 Plateau Pressure:  [14 cmH20-18 cmH20] 14 cmH20  INTAKE / OUTPUT: I/O last 3 completed shifts: In: 4184.5 [I.V.:4134.5; IV Piggyback:50] Out: 781 [Urine:781]     EXAM  General Appearance:    Looks criticall ill ,. Frail +  Head:    Normocephalic, without obvious abnormality, atraumatic  Eyes:    PERRL - yes, conjunctiva/corneas - clear      Ears:    Normal external ear canals, both ears  Nose:   NG tube - no  Throat:  ETT TUBE - yes ,  OG tube - no  Neck:   Supple,  No enlargement/tenderness/nodules     Lungs:     Clear to auscultation bilaterally with prolonged expiration, Ventilator   Synchrony - YES .  ON PSV  Chest wall:    No deformity  Heart:    S1 and S2 normal, no murmur, CVP - no.  Pressors - no  Abdomen:     Soft, no masses, no organomegaly  Genitalia:    Not done  Rectal:   not done  Extremities:   Extremities- no edema  aND WARM     Skin:   Intact in exposed areas . Sacral area - no decub per RN     Neurologic:   Sedation - just off sedation gttt -> RASS - -2/03 . Moves all 4s - yes per rn.       LABS  PULMONARY  Recent Labs Lab 01/28/17 0545 01/28/17 1040 01/28/17 1244 01/28/17 1747 01/29/17 0410  PHART 7.205* 7.152* 7.270* 7.335* 7.366  PCO2ART 82.9* 91.5* 65.4* 55.8* 48.2*  PO2ART 397* 275* 96.1 83.7 123*  HCO3 31.7* 30.7* 29.0* 28.9* 27.2  O2SAT 99.6 99.3 96.4 95.6 98.6    CBC  Recent Labs Lab 01/27/17 1918 01/28/17 0523 01/29/17 0349  HGB 13.7 12.4 11.8*  HCT 43.9 41.4 38.8  WBC 13.0* 12.1* 14.7*  PLT 209 167 241    COAGULATION No results for input(s): INR in the last 168 hours.  CARDIAC   Recent Labs Lab 01/27/17 1918 01/27/17 2143 01/28/17 0523 01/28/17 1114  TROPONINI 0.09* 0.07* 0.08* 0.05*   No results for input(s): PROBNP in the last 168 hours.   CHEMISTRY  Recent Labs Lab 01/27/17 1918 01/28/17 0523 01/29/17 0349  NA 146* 146* 146*  K 3.7 4.3 3.9  CL 102 106 111  CO2 31 32 28  GLUCOSE 198* 189* 225*  BUN 48* 48* 58*  CREATININE 1.12* 1.07* 1.04*  CALCIUM 8.7* 8.2* 7.8*  MG  --  2.3 2.3  PHOS  --  5.1* 3.9   Estimated Creatinine Clearance: 41.8 mL/min (A) (by C-G formula based on SCr of 1.04 mg/dL (H)).   LIVER  Recent Labs Lab 01/27/17 1918 01/28/17 0523  AST 41 31  ALT 19 17  ALKPHOS 43 39  BILITOT 0.6 0.4  PROT 7.3 6.7  ALBUMIN 3.8 3.2*     INFECTIOUS  Recent Labs Lab 01/27/17 1918  LATICACIDVEN 1.7      ENDOCRINE CBG (last 3)   Recent Labs  01/29/17 0008 01/29/17 0413 01/29/17 0822  GLUCAP 185* 203* 230*         IMAGING x48h  - image(s) personally visualized  -   highlighted in bold Dg Chest 2 View  Result Date: 01/27/2017 CLINICAL DATA:  Fall and chest pain EXAM: CHEST  2 VIEW COMPARISON:  06/27/2016 FINDINGS: Minimal atelectasis at the left lung base. Slight increased apical opacity on the right. Normal heart size. No pneumothorax. Degenerative changes of the right shoulder with calcific tendinitis. IMPRESSION: 1. Subtle increased density in the apical portion of the right upper lobe, may be artifactual and related to positioning and summation artifact, however short interval radiographic follow-up recommended to exclude parenchymal opacity in the region. 2. Minimal linear atelectasis at the left base. Electronically Signed   By: Jasmine Pang M.D.   On: 01/27/2017 19:06   Ct Head Wo Contrast  Result Date: 01/27/2017 CLINICAL DATA:  Patient fell this past Monday with questionable loss of consciousness. EXAM: CT HEAD WITHOUT CONTRAST TECHNIQUE: Contiguous axial images were obtained from the base of the skull through the vertex without intravenous contrast. COMPARISON:  None. FINDINGS: Brain: No evidence of acute infarction, hemorrhage, hydrocephalus, extra-axial collection or mass lesion/mass effect. Vascular: Moderate carotid siphon calcifications bilaterally. No hyperdense vessels. Skull: No fracture or primary bone lesions. Sinuses/Orbits: Clear mastoids and paranasal sinuses. Intact orbits and globes. Bilateral lens replacements surgeries. Other: Mild left posterior parietal scalp contusion. IMPRESSION: No acute intracranial abnormality. Left posterior parietal scalp contusion. Electronically Signed   By: Tollie Eth M.D.   On: 01/27/2017 18:54   Ct Angio Chest Pe W Or Wo Contrast  Result Date: 01/28/2017 CLINICAL DATA:  Hypoxic event EXAM: CT ANGIOGRAPHY CHEST WITH CONTRAST  TECHNIQUE: Multidetector CT imaging of the chest was performed using the standard protocol during bolus administration of intravenous contrast. Multiplanar CT image reconstructions and MIPs were obtained to evaluate the vascular anatomy. CONTRAST:  100 mL Isovue 370 intravenous COMPARISON:  Radiograph 01/27/2017 FINDINGS: Cardiovascular: Satisfactory opacification of the pulmonary arteries to the segmental level. No evidence of pulmonary embolism. Non aneurysmal aorta. Atherosclerotic calcifications. Coronary artery calcifications. Normal heart size. No pericardial effusion. Mediastinum/Nodes: Esophagus within normal limits. Midline trachea. No thyroid mass. No significantly enlarged mediastinal lymph nodes. Lungs/Pleura:  Mild emphysema within the upper lobes. Small area of focal consolidation in the lingula may reflect atelectasis or a small infiltrate. Trace pleural effusions. Upper Abdomen: 13 mm left adrenal gland nodule. Diffuse nodularity of the right adrenal gland partially visualized. Musculoskeletal: Mild compression of of T7. Review of the MIP images confirms the above findings. IMPRESSION: 1. Negative for acute pulmonary embolus. 2. Small focal consolidation in the lingula may reflect atelectasis or small infiltrate. There are trace effusions 3. Mild emphysema 4. Indeterminate 13 mm left adrenal gland nodule partially visualized. Diffuse nodularity of the right adrenal gland. Further evaluation with adrenal CT is suggested. Electronically Signed   By: Jasmine Pang M.D.   On: 01/28/2017 03:38   Mr Laqueta Jean ZO Contrast  Result Date: 01/28/2017 CLINICAL DATA:  Memory loss EXAM: MRI HEAD WITHOUT AND WITH CONTRAST TECHNIQUE: Multiplanar, multiecho pulse sequences of the brain and surrounding structures were obtained without and with intravenous contrast. CONTRAST:  10mL MULTIHANCE GADOBENATE DIMEGLUMINE 529 MG/ML IV SOLN COMPARISON:  CT 01/27/2017 FINDINGS: Brain: Mild generalized brain atrophy. Diffusion  imaging does not show any acute or subacute infarction. The brainstem and cerebellum are normal. Cerebral hemispheres show mild chronic small-vessel change of the white matter, common at this age. No cortical or large vessel territory infarction. No mass lesion, hemorrhage, hydrocephalus or extra-axial collection. After contrast administration, no abnormal enhancement occurs. Vascular: Major vessels at the base of the brain show flow. Skull and upper cervical spine: Negative Sinuses/Orbits: Clear/normal Other: None significant IMPRESSION: No acute or reversible finding. Age related atrophy and ordinary small vessel change of the hemispheric white matter. Electronically Signed   By: Paulina Fusi M.D.   On: 01/28/2017 15:54   Dg Chest Port 1 View  Result Date: 01/28/2017 CLINICAL DATA:  Shortness of breath. EXAM: PORTABLE CHEST 1 VIEW COMPARISON:  Single-view of the chest and CT chest earlier today. FINDINGS: There is mild bibasilar atelectasis. The lungs are otherwise clear. Heart size is normal. No pneumothorax. Aortic atherosclerosis noted. IMPRESSION: No acute disease. Electronically Signed   By: Drusilla Kanner M.D.   On: 01/28/2017 11:13   Dg Chest Port 1 View  Result Date: 01/28/2017 CLINICAL DATA:  Acute onset of hypoxia.  Initial encounter. EXAM: PORTABLE CHEST 1 VIEW COMPARISON:  Chest radiograph performed 01/27/2017, and CTA of the chest performed earlier today at 3:15 a.m. FINDINGS: The lungs are well-aerated. Mild vascular congestion is noted. Mild bibasilar airspace opacities are noted, possibly reflecting atelectasis or mild pneumonia. This appears new at the right lung base. There is no evidence of pleural effusion or pneumothorax. The cardiomediastinal silhouette is borderline enlarged. No acute osseous abnormalities are seen. IMPRESSION: Mild vascular congestion and borderline cardiomegaly. Mild bibasilar airspace opacities may reflect atelectasis or mild pneumonia. Electronically Signed   By:  Roanna Raider M.D.   On: 01/28/2017 06:10       ASSESSMENT and PLAN  Acute hypercapnic respiratory failure (HCC) S/p intubation 01/28/17  Clinically better 01/29/2017  PLAN PSV as tolerated 01/29/2017 but no extubation Full vent support otherwise - > reduce RR from19 to 12 for permissive hypercapnia Time ltd ventilator trial only per RN   COPD exacerbation (HCC) Clinically better  Plan Continue steroids IV Continue nebs Continue abx  Await RVP  Anti-infectives    Start     Dose/Rate Route Frequency Ordered Stop   01/29/17 1000  vancomycin (VANCOCIN) 500 mg in sodium chloride 0.9 % 100 mL IVPB     500 mg 100 mL/hr over  60 Minutes Intravenous Every 12 hours 01/28/17 1023     01/29/17 0600  azithromycin (ZITHROMAX) 500 mg in dextrose 5 % 250 mL IVPB     500 mg 250 mL/hr over 60 Minutes Intravenous Every 24 hours 01/28/17 1217 02/02/17 0559   01/28/17 1200  piperacillin-tazobactam (ZOSYN) IVPB 3.375 g     3.375 g 12.5 mL/hr over 240 Minutes Intravenous Every 8 hours 01/28/17 1023     01/28/17 1100  vancomycin (VANCOCIN) IVPB 1000 mg/200 mL premix     1,000 mg 200 mL/hr over 60 Minutes Intravenous  Once 01/28/17 1023 01/28/17 1328   01/28/17 0600  azithromycin (ZITHROMAX) 500 mg in dextrose 5 % 250 mL IVPB     500 mg 250 mL/hr over 60 Minutes Intravenous  Once 01/28/17 0556 01/28/17 0800       Elevated CK Very mild elevation  Plan Fluids Dc statin home med 01/29/2017 Monitor CK with new start of diprivan on 01/29/2017   DM (diabetes mellitus), type 2 with renal complications (HCC) ssi  Elevated troponin Type 2 an dvery mild  Plan Monitor Might need echo  Encephalopathy acute Related to hypercapnia 01/28/17 imprpving with vent Rx 01/29/17  Plan Dc fent gtt Start diprivain gtt Do fent prn RASs goal 0 to -2  Shock circulatory (HCC) Seems to be a low diat recording problem No evidence if air trapping Currently on neo 40  Plan Fluid bolus 500cc Continue  neo but change bp goal to sbp > 100 and MAP goal >55      FAMILY  - Updates: 01/29/2017 --> no family at bedside  - Inter-disciplinary family meet or Palliative Care meeting due by:  DAy 7. Current LOS is LOS 1 days  CODE STATUS    Code Status Orders        Start     Ordered   01/28/17 1233  Limited resuscitation (code)  Continuous    Question Answer Comment  In the event of cardiac or respiratory ARREST: Initiate Code Blue, Call Rapid Response No   In the event of cardiac or respiratory ARREST: Perform CPR No   In the event of cardiac or respiratory ARREST: Perform Intubation/Mechanical Ventilation Yes   In the event of cardiac or respiratory ARREST: Use NIPPV/BiPAp only if indicated Yes   In the event of cardiac or respiratory ARREST: Administer ACLS medications if indicated No   In the event of cardiac or respiratory ARREST: Perform Defibrillation or Cardioversion if indicated No      01/28/17 1233    Code Status History    Date Active Date Inactive Code Status Order ID Comments User Context   01/28/2017 12:00 PM 01/28/2017 12:33 PM Full Code [id number]  Wilber Oliphant, MD Inpatient   01/28/2017 11:36 AM 01/28/2017 12:00 PM Full Code [id number]  Lenox Ponds, MD Inpatient   01/28/2017 10:49 AM 01/28/2017 11:36 AM DNR [id number]  Lenox Ponds, MD Inpatient   01/27/2017 11:40 PM 01/28/2017 10:49 AM Full Code [id number]  Therisa Doyne, MD ED        DISPO Keep in ICU       The patient is critically ill with multiple organ systems failure and requires high complexity decision making for assessment and support, frequent evaluation and titration of therapies, application of advanced monitoring technologies and extensive interpretation of multiple databases.   Critical Care Time devoted to patient care services described in this note is  30  Minutes. This time reflects time of care of this signee  Dr Kalman Shan. This critical care time does not reflect procedure time,  or teaching time or supervisory time of PA/NP/Med student/Med Resident etc but could involve care discussion time    Dr. Kalman Shan, M.D., [facility].C.P Pulmonary and Critical Care Medicine Staff Physician [facility] [employer] Pulmonary and Critical Care Pager: [phone], If no answer or between  15:00h - 7:00h: call [phone]  01/29/2017 9:03 AM

## 2017-01-29 NOTE — Progress Notes (Signed)
Initial Nutrition Assessment  DOCUMENTATION CODES:   Non-severe (moderate) malnutrition in context of acute illness/injury  INTERVENTION:   Initiate Vital HP @ 20 ml/hr and increase by 10 ml every 4 hours to goal rate of 50 ml/hr.  Tube feeding regimen provides 1200 kcal (94% of needs), 105 grams of protein, and 1003 ml of H2O.   RD will continue to monitor  NUTRITION DIAGNOSIS:   Malnutrition (Moderate ) related to acute illness, poor appetite (fell 4 days ago) as evidenced by energy intake < or equal to 50% for > or equal to 5 days, mild depletion of body fat.  GOAL:   Patient will meet greater than or equal to 90% of their needs  MONITOR:   Vent status, Labs, Weight trends, TF tolerance, I & O's  REASON FOR ASSESSMENT:   Consult Enteral/tube feeding initiation and management  ASSESSMENT:   76 year old female with past medical history of diabetes hypertension and COPD who presented initially yesterday after having a fall 4 nights ago at home. She spent 4 days in the bathroom. When the coworker did not find her at work, called for help and patient was found down in the bathroom. She has been in the ICU during her stay she has gotten progressively more hypoxic and hypercapnic failing BiPAP so required intubation. Pmh xof DM, COPD, lipid and bp  Patient in room with grandson at bedside. Pt awake and attempted to answer RD's questions. Pt states via writing on paper, she last ate on Monday 5/28 when she returned from a beach trip. Per pt's grandson, pt usually eats oatmeal for breakfast and dinner and snacks throughout the day on fruits. Pt did not eat anything for 4 days PTA d/t falling and spending those days in the bathroom.   Nutrition-Focused physical exam completed. Findings are mild fat depletion, no muscle depletion, and no edema.   Patient is currently intubated on ventilator support MV: 8.9 L/min Temp (24hrs), Avg:97.6 F (36.4 C), Min:97 F (36.1 C), Max:97.9 F  (36.6 C)  Medications: Iv Protonix daily Labs reviewed: CBGs: 203-230 Elevated Na  Diet Order:  Diet Carb Modified Fluid consistency: Thin; Room service appropriate? Yes  Skin:  Reviewed, no issues  Last BM:  PTA  Height:   Ht Readings from Last 1 Encounters:  01/28/17 5\' 2"  (1.575 m)    Weight:   Wt Readings from Last 1 Encounters:  01/29/17 156 lb 4.9 oz (70.9 kg)    Ideal Body Weight:  50 kg  BMI:  Body mass index is 28.59 kg/m.  Estimated Nutritional Needs:   Kcal:  1282  Protein:  100-110g  Fluid:  1.5L/day  EDUCATION NEEDS:   No education needs identified at this time  Tilda FrancoLindsey Scherrie [place], MS, RD, LDN Pager: [phone] After Hours Pager: [phone]

## 2017-01-29 NOTE — Assessment & Plan Note (Signed)
Related to hypercapnia 01/28/17 imprpving with vent Rx 01/29/17  Plan Dc fent gtt Start diprivain gtt Do fent prn RASs goal 0 to -2

## 2017-01-29 NOTE — Assessment & Plan Note (Signed)
Very mild elevation  Plan Fluids Dc statin home med 01/29/2017 Monitor CK with new start of diprivan on 01/29/2017

## 2017-01-30 ENCOUNTER — Inpatient Hospital Stay (HOSPITAL_COMMUNITY): Payer: Medicare Other

## 2017-01-30 DIAGNOSIS — R579 Shock, unspecified: Secondary | ICD-10-CM

## 2017-01-30 DIAGNOSIS — J441 Chronic obstructive pulmonary disease with (acute) exacerbation: Principal | ICD-10-CM

## 2017-01-30 LAB — GLUCOSE, CAPILLARY
GLUCOSE-CAPILLARY: 232 mg/dL — AB (ref 65–99)
GLUCOSE-CAPILLARY: 264 mg/dL — AB (ref 65–99)
GLUCOSE-CAPILLARY: 195 mg/dL — AB (ref 65–99)
GLUCOSE-CAPILLARY: 247 mg/dL — AB (ref 65–99)
GLUCOSE-CAPILLARY: 242 mg/dL — AB (ref 65–99)
Glucose-Capillary: 163 mg/dL — ABNORMAL HIGH (ref 65–99)

## 2017-01-30 LAB — URINALYSIS, ROUTINE W REFLEX MICROSCOPIC
Bilirubin Urine: NEGATIVE
GLUCOSE, UA: 50 mg/dL — AB
Ketones, ur: NEGATIVE mg/dL
Nitrite: NEGATIVE
Protein, ur: 30 mg/dL — AB
SQUAMOUS EPITHELIAL / LPF: NONE SEEN
Specific Gravity, Urine: 1.024 (ref 1.005–1.030)
pH: 5 (ref 5.0–8.0)

## 2017-01-30 LAB — TROPONIN I

## 2017-01-30 LAB — BASIC METABOLIC PANEL
ANION GAP: 7 (ref 5–15)
BUN: 60 mg/dL — ABNORMAL HIGH (ref 6–20)
CALCIUM: 8 mg/dL — AB (ref 8.9–10.3)
CO2: 29 mmol/L (ref 22–32)
Chloride: 113 mmol/L — ABNORMAL HIGH (ref 101–111)
Creatinine, Ser: 0.98 mg/dL (ref 0.44–1.00)
GFR calc non Af Amer: 54 mL/min — ABNORMAL LOW (ref >60)
Glucose, Bld: 251 mg/dL — ABNORMAL HIGH (ref 65–99)
Potassium: 4 mmol/L (ref 3.5–5.1)
Sodium: 149 mmol/L — ABNORMAL HIGH (ref 135–145)

## 2017-01-30 LAB — CBC WITH DIFFERENTIAL/PLATELET
BASOS ABS: 0 10*3/uL (ref 0.0–0.1)
BASOS PCT: 0 %
EOS ABS: 0 10*3/uL (ref 0.0–0.7)
Eosinophils Relative: 0 %
HCT: 35.5 % — ABNORMAL LOW (ref 36.0–46.0)
HEMOGLOBIN: 10.7 g/dL — AB (ref 12.0–15.0)
LYMPHS ABS: 0.4 10*3/uL — AB (ref 0.7–4.0)
Lymphocytes Relative: 4 %
MCH: 31.5 pg (ref 26.0–34.0)
MCHC: 30.1 g/dL (ref 30.0–36.0)
MCV: 104.4 fL — ABNORMAL HIGH (ref 78.0–100.0)
Monocytes Absolute: 0.3 10*3/uL (ref 0.1–1.0)
Monocytes Relative: 3 %
NEUTROS PCT: 93 %
Neutro Abs: 9.2 10*3/uL — ABNORMAL HIGH (ref 1.7–7.7)
Platelets: 167 10*3/uL (ref 150–400)
RBC: 3.4 MIL/uL — AB (ref 3.87–5.11)
RDW: 15.4 % (ref 11.5–15.5)
WBC: 9.9 10*3/uL (ref 4.0–10.5)

## 2017-01-30 LAB — HEMOGLOBIN A1C
HEMOGLOBIN A1C: 7.1 % — AB (ref 4.8–5.6)
MEAN PLASMA GLUCOSE: 157 mg/dL

## 2017-01-30 LAB — CK: CK TOTAL: 430 U/L — AB (ref 38–234)

## 2017-01-30 LAB — MAGNESIUM: Magnesium: 2.5 mg/dL — ABNORMAL HIGH (ref 1.7–2.4)

## 2017-01-30 LAB — LACTIC ACID, PLASMA: LACTIC ACID, VENOUS: 0.9 mmol/L (ref 0.5–1.9)

## 2017-01-30 LAB — PHOSPHORUS: PHOSPHORUS: 2.8 mg/dL (ref 2.5–4.6)

## 2017-01-30 MED ORDER — VITAMINS A & D EX OINT
TOPICAL_OINTMENT | CUTANEOUS | Status: AC
  Administered 2017-01-30: 12:00:00
  Filled 2017-01-30: qty 5

## 2017-01-30 MED ORDER — FUROSEMIDE 10 MG/ML IJ SOLN
40.0000 mg | Freq: Once | INTRAMUSCULAR | Status: AC
  Administered 2017-01-30: 40 mg via INTRAVENOUS

## 2017-01-30 MED ORDER — ORAL CARE MOUTH RINSE
15.0000 mL | Freq: Two times a day (BID) | OROMUCOSAL | Status: DC
  Administered 2017-01-30 – 2017-02-03 (×8): 15 mL via OROMUCOSAL

## 2017-01-30 MED ORDER — ORAL CARE MOUTH RINSE
15.0000 mL | Freq: Four times a day (QID) | OROMUCOSAL | Status: DC
  Administered 2017-01-30 (×3): 15 mL via OROMUCOSAL

## 2017-01-30 MED ORDER — METHYLPREDNISOLONE SODIUM SUCC 40 MG IJ SOLR
40.0000 mg | Freq: Two times a day (BID) | INTRAMUSCULAR | Status: DC
  Administered 2017-01-30 – 2017-01-31 (×2): 40 mg via INTRAVENOUS
  Filled 2017-01-30 (×2): qty 1

## 2017-01-30 MED ORDER — CHLORHEXIDINE GLUCONATE 0.12% ORAL RINSE (MEDLINE KIT)
15.0000 mL | Freq: Two times a day (BID) | OROMUCOSAL | Status: DC
  Administered 2017-01-30: 15 mL via OROMUCOSAL

## 2017-01-30 MED ORDER — IBUPROFEN 200 MG PO TABS
400.0000 mg | ORAL_TABLET | Freq: Four times a day (QID) | ORAL | Status: DC | PRN
  Administered 2017-01-30 – 2017-02-02 (×2): 400 mg via ORAL
  Filled 2017-01-30 (×2): qty 2

## 2017-01-30 NOTE — Progress Notes (Signed)
Nutrition Follow-up  DOCUMENTATION CODES:   Non-severe (moderate) malnutrition in context of acute illness/injury  INTERVENTION:  - Diet advancement as medically feasible. - Will monitor for needs at follow-up.  NUTRITION DIAGNOSIS:   Malnutrition (Moderate ) related to acute illness, poor appetite (fell 4 days ago) as evidenced by energy intake < or equal to 50% for > or equal to 5 days, mild depletion of body fat. -ongoing  GOAL:   Patient will meet greater than or equal to 90% of their needs -unable to meet at this time.   MONITOR:   Diet advancement, Weight trends, Labs  ASSESSMENT:   76 year old female with past medical history of diabetes hypertension and COPD who presented initially yesterday after having a fall 4 nights ago at home. She spent 4 days in the bathroom. When the coworker did not find her at work, called for help and patient was found down in the bathroom. She has been in the ICU during her stay she has gotten progressively more hypoxic and hypercapnic failing BiPAP so required intubation. Pmh xof DM, COPD, lipid and bp  6/4 Pt was extubated to [place] ~10 minutes ago. TF was stopped prior to extubation. Estimated nutrition needs updated at this time and based on sepsis. Weight from yesterday (70.9 kg) used in estimating needs as weight is +2.1 kg over night.   Medications reviewed; 40 mg IV Lasix x1 dose today, sliding scale Novolog, 40 mg Solu-medrol BID, 40 mg IV Protonix/day. Labs reviewed; CBGs: 232 and 264 mg/dL, Na: 951149 mmol/L, Cl: 884113 mmol/L, BUN: 60 mg/dL, Mg: 2.5 mg/dL, GFR: 54 mL/min.    6/3 - Patient in room with grandson at bedside.  - Pt awake and attempted to answer RD's questions. - Pt states via writing on paper, she last ate on Monday 5/28 when she returned from a beach trip.  - Per pt's grandson, pt usually eats oatmeal for breakfast and dinner and snacks throughout the day on fruits. - Pt did not eat anything for 4 days PTA d/t falling and  spending those days in the bathroom.  - Nutrition-Focused physical exam completed. Findings are mild fat depletion, no muscle depletion, and no edema.   Patient is currently intubated on ventilator support MV: 8.9 L/min Temp (24hrs), Avg:97.6 F (36.4 C), Min:97 F (36.1 C), Max:97.9 F (36.6 C)   Diet Order:  Diet NPO time specified Except for: Ice Chips  Skin:  Reviewed, no issues  Last BM:  PTA/unknown  Height:   Ht Readings from Last 1 Encounters:  01/28/17 5\' 2"  (1.575 m)    Weight:   Wt Readings from Last 1 Encounters:  01/30/17 160 lb 15 oz (73 kg)    Ideal Body Weight:  50 kg  BMI:  Body mass index is 29.44 kg/m.  Estimated Nutritional Needs:   Kcal:  1770-1985 (25-28 kcal/kg)  Protein:  85-100 grams (1.2-1.4 grams/kg)  Fluid:  1.5L/day  EDUCATION NEEDS:   No education needs identified at this time    Trenton GammonJessica Liela Rylee, MS, RD, LDN, CNSC Inpatient Clinical Dietitian Pager # [phone] After hours/weekend pager # [phone]

## 2017-01-30 NOTE — Progress Notes (Signed)
PULMONARY / CRITICAL CARE MEDICINE   Name: Beth Kerr MRN: [id number] DOB: Jul 19, 1941 PCP [name], MD LOS 2 as of 01/30/2017     ADMISSION DATE:  01/27/2017 CONSULTATION DATE:  01/28/17  REFERRING MD:  [facility] hospitalist Dr Edward Jolly  CHIEF COMPLAINT:  Acute on hypercapnic resp failure  BRIEF 76 year old female with  diabetes hypertension and COPD adm 6/1 after a fall, developed  hypoxic and hypercapnea failing BiPAP so required intubation.   EVENTS 01/27/2017 - admit 01/28/17 - VDRF after failing bipap and with hypercapnia. Reversal of code status  Significant tests/ events reviewed  CT angio neg PE MRI brain neg    SUBJECTIVE/OVERNIGHT/INTERVAL HX  Off neo gtt Denies CP, dyspnea Anxious appearing on vent, niece at bedside   VITAL SIGNS: BP (!) 147/50   Pulse 96   Temp 97.3 F (36.3 C) (Axillary)   Resp 18   Ht 5\' 2"  (1.575 m)   Wt 160 lb 15 oz (73 kg)   SpO2 94%   BMI 29.44 kg/m   HEMODYNAMICS:    VENTILATOR SETTINGS: Vent Mode: PRVC FiO2 (%):  [40 %] 40 % Set Rate:  [12 bmp] 12 bmp Vt Set:  [350 mL] 350 mL PEEP:  [5 cmH20] 5 cmH20 Pressure Support:  [8 cmH20] 8 cmH20 Plateau Pressure:  [10 cmH20-22 cmH20] 10 cmH20  INTAKE / OUTPUT: I/O last 3 completed shifts: In: 3328.5 [I.V.:1324.3; NG/GT:654.2; IV Piggyback:1350] Out: 1043 [Urine:1043]     EXAM  Gen. Pleasant,elderly,well-nourished, in no distress, anxious affect ENT - no lesions, no post nasal drip Neck: No JVD, no thyromegaly, no carotid bruits Lungs: no use of accessory muscles, no dullness to percussion, clear without rales or rhonchi  Cardiovascular: Rhythm regular, heart sounds  normal, no murmurs or gallops, no peripheral edema Abdomen: soft and non-tender, no hepatosplenomegaly, BS normal. Musculoskeletal: No deformities, no cyanosis or clubbing Neuro:  alert, non focal                                                               LABS  PULMONARY  Recent  Labs Lab 01/28/17 0545 01/28/17 1040 01/28/17 1244 01/28/17 1747 01/29/17 0410  PHART 7.205* 7.152* 7.270* 7.335* 7.366  PCO2ART 82.9* 91.5* 65.4* 55.8* 48.2*  PO2ART 397* 275* 96.1 83.7 123*  HCO3 31.7* 30.7* 29.0* 28.9* 27.2  O2SAT 99.6 99.3 96.4 95.6 98.6    CBC  Recent Labs Lab 01/28/17 0523 01/29/17 0349 01/30/17 0338  HGB 12.4 11.8* 10.7*  HCT 41.4 38.8 35.5*  WBC 12.1* 14.7* 9.9  PLT 167 241 167    COAGULATION No results for input(s): INR in the last 168 hours.  CARDIAC    Recent Labs Lab 01/27/17 1918 01/27/17 2143 01/28/17 0523 01/28/17 1114 01/30/17 0338  TROPONINI 0.09* 0.07* 0.08* 0.05* <0.03   No results for input(s): PROBNP in the last 168 hours.   CHEMISTRY  Recent Labs Lab 01/27/17 1918 01/28/17 0523 01/29/17 0349 01/30/17 0338  NA 146* 146* 146* 149*  K 3.7 4.3 3.9 4.0  CL 102 106 111 113*  CO2 31 32 28 29  GLUCOSE 198* 189* 225* 251*  BUN 48* 48* 58* 60*  CREATININE 1.12* 1.07* 1.04* 0.98  CALCIUM 8.7* 8.2* 7.8* 8.0*  MG  --  2.3 2.3 2.5*  PHOS  --  5.1* 3.9 2.8   Estimated Creatinine Clearance: 45 mL/min (by C-G formula based on SCr of 0.98 mg/dL).   LIVER  Recent Labs Lab 01/27/17 1918 01/28/17 0523  AST 41 31  ALT 19 17  ALKPHOS 43 39  BILITOT 0.6 0.4  PROT 7.3 6.7  ALBUMIN 3.8 3.2*     INFECTIOUS  Recent Labs Lab 01/27/17 1918 01/30/17 0338  LATICACIDVEN 1.7 0.9     ENDOCRINE CBG (last 3)   Recent Labs  01/29/17 2318 01/30/17 0303 01/30/17 0734  GLUCAP 250* 232* 264*         IMAGING x48h  -   CXR 6/4 BL interstitial infx + edema pattern   DISCUSSION: Cause of hypercarbia is not clear-attributed to aspiration pneumonia from being down on the floor for several days, being treated for COPD exacerbation although no bronchospasm documented  ASSESSMENT / PLAN:  PULMONARY A: Acute hypercarbic respiratory failure P:   She is weaning well on pressure support today, we'll proceed with  extubation Family does request reintubation if required Decrease Medrol 40 every 12  CARDIOVASCULAR A:  Transient shock, has now resolved, likely related to mechanical ventilation and propofol P:  No issues  RENAL A:   Prerenal acidemia, mild hypernatremia Mild rhabdo -resolved P:   Repeat Lasix 1 for extubation and then allow oral hydration  GASTROINTESTINAL A:   Moderate protein calorie malnutrition P:   Hold tube feeds for extubation and resume diet in 4 hours  HEMATOLOGIC A:   Mild anemia P:  Follow  INFECTIOUS A:   Aspiration pneumonia P:   Can discontinue vancomycin and continue Zosyn for now  ENDOCRINE A:   Uncontrolled hyperglycemia, diabetes type 2, worsened with steroids   P:   She'll improve now that Solu-Medrol decreased  NEUROLOGIC A:   Acute encephalopathy, MRI negative P:   RASS goal: 0 Hold all sedating medications PT evaluation   FAMILY  - Updates: Updated niece at the bedside who will update grandson, they do request reintubation for now, remains limited CODE STATUS  - Inter-disciplinary family meet or Palliative Care meeting due by:  done   The patient is critically ill with multiple organ systems failure and requires high complexity decision making for assessment and support, frequent evaluation and titration of therapies, application of advanced monitoring technologies and extensive interpretation of multiple databases. Critical Care Time devoted to patient care services described in this note independent of APP time is 33 minutes.    Cyril Mourningakesh Taeveon Keesling MD. Tonny BollmanFCCP. [facility] Pager [phone] If no response call [phone]    01/30/2017, 10:25 AM

## 2017-01-30 NOTE — Progress Notes (Addendum)
OT Cancellation Note  Patient Details Name: Isaac LaudBetty H Brosky MRN: [id number] DOB: Aug 27, 1941   Cancelled Treatment:    Reason Eval/Treat Not Completed: Patient not medically ready. Pt remains on ventillator.OT signing off. Please reorder when medically appropriate. Thanks  [facility] HospitalWARD,HILLARY  Deionna Marcantonio, OT/L  [phone] 01/30/2017 01/30/2017, 9:26 AM

## 2017-01-30 NOTE — Progress Notes (Signed)
Physical Therapy Discharge Patient Details Name: Beth LaudBetty H Kerr MRN: [id number] DOB: 09/02/1940 Today's Date: 01/30/2017 Time:  -     Patient discharged from PT services secondary to medical decline - will need to re-order PT to resume therapy services.Remains on ventilator.   Sharen HeckHill, Cabela Pacifico [name] PT [phone]  01/30/2017, 7:15 AM

## 2017-01-30 NOTE — Care Management Note (Signed)
Case Management Note  Patient Details  Name: [name] MRN: [id number] Date of Birth: Sep 25, 1940  Subjective/Objective:                  76 year old female with past medical history of diabetes hypertension and COPD who presented initially yesterday after having a fall 4 nights ago at home. She spent 4 days in the bathroom. When the coworker did not find her at work, called for help and patient was found down in the bathroom. She has been in the ICU during her stay she has gotten progressively more hypoxic and hypercapnic failing BiPAP so required intubation.  Action/Plan: Date:  January 30, 2017  Chart reviewed for concurrent status and case management needs.  Will continue to follow patient progress.  Discharge Planning: following for needs  Expected discharge date: [id number]  Marcelle SmilingRhonda Kerr, BSN, [place]RN3, [place]CCM   [phone]   Expected Discharge Date:   (unknown)               Expected Discharge Plan:  Home/Self Care  In-House Referral:     Discharge planning Services  CM Consult  Post Acute Care Choice:    Choice offered to:     DME Arranged:    DME Agency:     HH Arranged:    HH Agency:     Status of Service:  In process, will continue to follow  If discussed at Long Length of Stay Meetings, dates discussed:    Additional Comments:  Golda AcreDavis, Beth Lynn, RN 01/30/2017, 9:19 AM

## 2017-01-30 NOTE — Progress Notes (Signed)
eLink Physician-Brief Progress Note Patient Name: Beth LaudBetty H Kerr DOB: 19-Feb-1941 MRN: [id number]   Date of Service  01/30/2017  HPI/Events of Note  Multiple issues: 1. Urine now pink tinged. Patient is on Heparin SQ and SCD's and 2. Patient c/o rib pain related to coughing. Creatinine = 0.98.  eICU Interventions  Will order: 1. D/C Heparin [place]. 2. UA now. 3. Continue SCD's. 4. Motrin 400 mg PO Q 6 hours PRN pain.      Intervention Category Major Interventions: Other: Intermediate Interventions: Pain - evaluation and management  Sommer,Steven Eugene 01/30/2017, 10:08 PM

## 2017-01-30 NOTE — Procedures (Signed)
Extubation Procedure Note  Patient Details:   Name: Isaac LaudBetty H Pingree DOB: 09-16-1940 MRN: [id number]   Airway Documentation:     Evaluation  O2 sats: stable throughout Complications: No apparent complications Patient did tolerate procedure well. Bilateral Breath Sounds: Clear, Diminished   Yes   Patient extubated to 3 L [place]. No distress noted. RN at bedside.   Dannielle KarvonenMegan K Kashlyn Salinas 01/30/2017, 9:27 AM

## 2017-01-31 ENCOUNTER — Inpatient Hospital Stay (HOSPITAL_COMMUNITY): Payer: Medicare Other

## 2017-01-31 ENCOUNTER — Inpatient Hospital Stay (HOSPITAL_COMMUNITY)
Admit: 2017-01-31 | Discharge: 2017-01-31 | Disposition: A | Discharge status: Home / Self Care | Payer: Medicare Other | Attending: Acute Care | Admitting: Acute Care

## 2017-01-31 DIAGNOSIS — G9340 Encephalopathy, unspecified: Secondary | ICD-10-CM

## 2017-01-31 LAB — CBC WITH DIFFERENTIAL/PLATELET
Basophils Absolute: 0 10*3/uL (ref 0.0–0.1)
Basophils Relative: 0 %
Eosinophils Absolute: 0 10*3/uL (ref 0.0–0.7)
Eosinophils Relative: 0 %
HCT: 34.9 % — ABNORMAL LOW (ref 36.0–46.0)
Hemoglobin: 10.3 g/dL — ABNORMAL LOW (ref 12.0–15.0)
LYMPHS ABS: 0.4 10*3/uL — AB (ref 0.7–4.0)
LYMPHS PCT: 5 %
MCH: 30.3 pg (ref 26.0–34.0)
MCHC: 29.5 g/dL — AB (ref 30.0–36.0)
MCV: 102.6 fL — AB (ref 78.0–100.0)
MONOS PCT: 9 %
Monocytes Absolute: 0.7 10*3/uL (ref 0.1–1.0)
Neutro Abs: 6.6 10*3/uL (ref 1.7–7.7)
Neutrophils Relative %: 86 %
PLATELETS: 160 10*3/uL (ref 150–400)
RBC: 3.4 MIL/uL — AB (ref 3.87–5.11)
RDW: 15.1 % (ref 11.5–15.5)
WBC: 7.7 10*3/uL (ref 4.0–10.5)

## 2017-01-31 LAB — GLUCOSE, CAPILLARY
GLUCOSE-CAPILLARY: 221 mg/dL — AB (ref 65–99)
GLUCOSE-CAPILLARY: 106 mg/dL — AB (ref 65–99)
Glucose-Capillary: 154 mg/dL — ABNORMAL HIGH (ref 65–99)
Glucose-Capillary: 158 mg/dL — ABNORMAL HIGH (ref 65–99)
Glucose-Capillary: 224 mg/dL — ABNORMAL HIGH (ref 65–99)

## 2017-01-31 LAB — CREATININE, SERUM
Creatinine, Ser: 1 mg/dL (ref 0.44–1.00)
GFR calc Af Amer: >60 mL/min (ref >60)
GFR calc non Af Amer: 53 mL/min — ABNORMAL LOW (ref >60)

## 2017-01-31 LAB — RETICULOCYTES
RBC.: 3.32 MIL/uL — ABNORMAL LOW (ref 3.87–5.11)
Retic Count, Absolute: 83 10*3/uL (ref 19.0–186.0)
Retic Ct Pct: 2.5 % (ref 0.4–3.1)

## 2017-01-31 LAB — FERRITIN: Ferritin: 60 ng/mL (ref 11–307)

## 2017-01-31 LAB — IRON AND TIBC
IRON: 31 ug/dL (ref 28–170)
SATURATION RATIOS: 11 % (ref 10.4–31.8)
TIBC: 273 ug/dL (ref 250–450)
UIBC: 242 ug/dL

## 2017-01-31 LAB — MAGNESIUM: Magnesium: 2.4 mg/dL (ref 1.7–2.4)

## 2017-01-31 LAB — PHOSPHORUS: Phosphorus: 3.7 mg/dL (ref 2.5–4.6)

## 2017-01-31 LAB — CK: CK TOTAL: 330 U/L — AB (ref 38–234)

## 2017-01-31 LAB — FOLATE: FOLATE: 13.4 ng/mL (ref >5.9)

## 2017-01-31 LAB — VITAMIN B12: VITAMIN B 12: 98 pg/mL — AB (ref 180–914)

## 2017-01-31 MED ORDER — PANTOPRAZOLE SODIUM 40 MG PO TBEC
40.0000 mg | DELAYED_RELEASE_TABLET | Freq: Every day | ORAL | Status: DC
  Administered 2017-01-31 – 2017-02-03 (×4): 40 mg via ORAL
  Filled 2017-01-31 (×4): qty 1

## 2017-01-31 MED ORDER — FUROSEMIDE 10 MG/ML IJ SOLN
40.0000 mg | Freq: Every day | INTRAMUSCULAR | Status: DC
  Administered 2017-02-01: 40 mg via INTRAVENOUS
  Filled 2017-01-31: qty 4

## 2017-01-31 MED ORDER — ALBUTEROL SULFATE (2.5 MG/3ML) 0.083% IN NEBU: 2.5000 mg | INHALATION_SOLUTION | RESPIRATORY_TRACT | Status: DC | PRN

## 2017-01-31 MED ORDER — AMOXICILLIN-POT CLAVULANATE 875-125 MG PO TABS
1.0000 | ORAL_TABLET | Freq: Two times a day (BID) | ORAL | Status: DC
  Administered 2017-01-31 – 2017-02-03 (×7): 1 via ORAL
  Filled 2017-01-31 (×7): qty 1

## 2017-01-31 MED ORDER — IPRATROPIUM-ALBUTEROL 0.5-2.5 (3) MG/3ML IN SOLN
3.0000 mL | Freq: Two times a day (BID) | RESPIRATORY_TRACT | Status: DC
  Administered 2017-01-31 – 2017-02-02 (×4): 3 mL via RESPIRATORY_TRACT
  Filled 2017-01-31 (×4): qty 3

## 2017-01-31 MED ORDER — VITAMIN B-12 1000 MCG PO TABS
1000.0000 ug | ORAL_TABLET | Freq: Every day | ORAL | Status: DC
  Administered 2017-01-31 – 2017-02-03 (×4): 1000 ug via ORAL
  Filled 2017-01-31 (×4): qty 1

## 2017-01-31 NOTE — Evaluation (Signed)
Physical Therapy Evaluation Patient Details Name: Beth Kerr MRN: [id number] DOB: 24-Jul-1941 Today's Date: 01/31/2017   History of Present Illness  76 year old with COPD admitted after being found down on the floor with mild rhabdomyolysis.  Per review of chart, pt was down for 4 days at home.  After admission, developed hypercarbic respiratory failure requiring mechanical ventilation. She was extubated 6/4 and has done well since then per CCS note  Clinical Impression  The patient having a coughing spell when entered room. sats dropped to 85%  On 1 liter.  RN upped to 2 liters. Assisted patient to recliner. Very weak. Agreeable to rehab. Pt admitted with above diagnosis. Pt currently with functional limitations due to the deficits listed below (see PT Problem List).t will benefit from skilled PT to increase their independence and safety with mobility to allow discharge to the venue listed below.       Follow Up Recommendations SNF;Supervision/Assistance - 24 hour    Equipment Recommendations  Rolling walker with 5" wheels    Recommendations for Other Services OT consult     Precautions / Restrictions Precautions Precautions: Fall Precaution Comments: monitor sats      Mobility  Bed Mobility Overal bed mobility: Needs Assistance Bed Mobility: Supine to Sit     Supine to sit: Mod assist;HOB elevated        Transfers Overall transfer level: Needs assistance Equipment used: 1 person hand held assist Transfers: Sit to/from [employer] Transfers Sit to Stand: Max assist Stand pivot transfers: Max assist       General transfer comment: bear hug technique to pivot to recliner. Sats 88% on @ liters.  Ambulation/Gait                Stairs            Wheelchair Mobility    Modified Rankin (Stroke Patients Only)       Balance                                             Pertinent Vitals/Pain Pain Assessment: No/denies pain     Home Living Family/patient expects to be discharged to:: Private residence Living Arrangements: Alone             Home Equipment: None      Prior Function Level of Independence: Independent               Hand Dominance        Extremity/Trunk Assessment   Upper Extremity Assessment Upper Extremity Assessment: Generalized weakness    Lower Extremity Assessment Lower Extremity Assessment: Generalized weakness    Cervical / Trunk Assessment Cervical / Trunk Assessment: Normal  Communication      Cognition Arousal/Alertness: Awake/alert Behavior During Therapy: WFL for tasks assessed/performed Overall Cognitive Status: Within Functional Limits for tasks assessed                                        General Comments      Exercises     Assessment/Plan    PT Assessment Patient needs continued PT services  PT Problem List Decreased strength;Decreased activity tolerance;Decreased balance;Decreased mobility;Decreased knowledge of precautions;Cardiopulmonary status limiting activity;Decreased safety awareness;Decreased knowledge of use of DME       PT Treatment Interventions  DME instruction;Gait training;Functional mobility training;Therapeutic activities;Therapeutic exercise;Patient/family education    PT Goals (Current goals can be found in the Care Plan section)  Acute Rehab PT Goals Patient Stated Goal: to get up PT Goal Formulation: With patient Time For Goal Achievement: 02/14/17 Potential to Achieve Goals: Good    Frequency Min 3X/week   Barriers to discharge Decreased caregiver support      Co-evaluation               AM-PAC PT "6 Clicks" Daily Activity  Outcome Measure Difficulty turning over in bed (including adjusting bedclothes, sheets and blankets)?: A Lot Difficulty moving from lying on back to sitting on the side of the bed? : A Lot Difficulty sitting down on and standing up from a chair with arms (e.g.,  wheelchair, bedside commode, etc,.)?: A Lot Help needed moving to and from a bed to chair (including a wheelchair)?: A Lot Help needed walking in hospital room?: Total Help needed climbing 3-5 steps with a railing? : Total 6 Click Score: 10    End of Session   Activity Tolerance: Patient limited by fatigue Patient left: in chair;with call bell/phone within reach;with chair alarm set;with family/visitor present Nurse Communication: Mobility status PT Visit Diagnosis: Unsteadiness on feet (R26.81);Difficulty in walking, not elsewhere classified (R26.2)    Time: [id number] PT Time Calculation (min) (ACUTE ONLY): 23 min   Charges:   PT Evaluation $PT Eval Low Complexity: 1 Procedure PT Treatments $Therapeutic Activity: 8-22 mins   PT G CodesBlanchard Kelch:        Julann Mcgilvray PT [phone]   Rada HayHill, Aneshia Jacquet Elizabeth 01/31/2017, 6:02 PM

## 2017-01-31 NOTE — Progress Notes (Signed)
PROGRESS NOTE  JADAMARIE BUTSON ZOX:[id number] DOB: Mar 25, 1941 DOA: 01/27/2017 PCP: [name], MD   Brief summary:  Presented with a fall 4 nights ago she was unable to get up since her fall and spent 4 days in the bathroom,she was found to have hypoxia, on day two of admission, she is lethargic, abg showed hypercapnea, ph 7.15, she failed bipap , had to be intubated and transferred to icu, she is extubated on 6/4, transferred to hospitalist service on 6/5    HPI/Recap of past 24 hours:  Feeling better, on one liter oxygen at rest  Assessment/Plan: Principal Problem:   Acute hypercapnic respiratory failure (HCC) Active Problems:   COPD exacerbation (HCC)   Rhabdomyolysis   Elevated troponin   DM (diabetes mellitus), type 2 with renal complications (HCC)   Syncope and collapse   Global amnesia   Elevated CK   Encephalopathy acute   Shock circulatory (HCC)  Acute hypoxic and hypercarbic  respiratory failure.  Etiology unclear. Likely  COPD exacerbation. ?Aspiration PNA She required intubation, extubated on 6/4, now on 1liter oxygen at rest On augmentin (day 4/8)/nebs, off steroids Wean o2, speech eval  Acute metabolic encephalopathy:  likely due to co2 retention, mri brain no acute findings, eeg no epileptic spikes Resolved.  Diastolic chf:  cxr with pleural effusion, she received lasix x1 on 6/2, repeat cxr still + pleural effusion, start lasix daily x2 doses, monitor volume status Echo with diastolic chf  Macrocytic anemia: hgb 10 stable, mcv 102. b12 low at 98, start b12 supplement  Microscopic hematuria: urine culture no growth, get ct renal stone study, foley trauma? Repeat ua ,  may need  urology consult if persist ,   noninsulin dependent diabetes, a1c 7, home oral meds held, on ssi here   HTN; home meds norvasc, volsartan /hctz held, bp stable  Falls at home, PT eval, snf placement  Code Status: no cpr, ok with intubation  Family Communication: patient     Disposition Plan: transfer from stepdown to med tele   Consultants:  pccm  Procedures:  Intubation/extubation  Antibiotics:  augmentin   Objective: BP (!) 133/42 (BP Location: Left Arm)   Pulse 67   Temp 98.5 F (36.9 C) (Oral)   Resp 15   Ht 5\' 2"  (1.575 m)   Wt 77.8 kg (171 lb 8.3 oz)   SpO2 92%   BMI 31.37 kg/m   Intake/Output Summary (Last 24 hours) at 01/31/17 0915 Last data filed at 01/31/17 0800  Gross per 24 hour  Intake              600 ml  Output             2226 ml  Net            -1626 ml   Filed Weights   01/29/17 0500 01/30/17 0312 01/31/17 0335  Weight: 70.9 kg (156 lb 4.9 oz) 73 kg (160 lb 15 oz) 77.8 kg (171 lb 8.3 oz)    Exam:   General:  Frail elderly female, NAD,   Cardiovascular: RRR  Respiratory: diminished, no wheezing, no rales, no rhonchi  Abdomen: Soft/ND/NT, positive BS  Musculoskeletal: No Edema  Neuro: aaox3  Data Reviewed: Basic Metabolic Panel:  Recent Labs Lab 01/27/17 1918 01/28/17 0523 01/29/17 0349 01/30/17 0338 01/31/17 0323  NA 146* 146* 146* 149*  --   K 3.7 4.3 3.9 4.0  --   CL 102 106 111 113*  --   CO2  31 32 28 29  --   GLUCOSE 198* 189* 225* 251*  --   BUN 48* 48* 58* 60*  --   CREATININE 1.12* 1.07* 1.04* 0.98 1.00  CALCIUM 8.7* 8.2* 7.8* 8.0*  --   MG  --  2.3 2.3 2.5* 2.4  PHOS  --  5.1* 3.9 2.8 3.7   Liver Function Tests:  Recent Labs Lab 01/27/17 1918 01/28/17 0523  AST 41 31  ALT 19 17  ALKPHOS 43 39  BILITOT 0.6 0.4  PROT 7.3 6.7  ALBUMIN 3.8 3.2*   No results for input(s): LIPASE, AMYLASE in the last 168 hours. No results for input(s): AMMONIA in the last 168 hours. CBC:  Recent Labs Lab 01/27/17 1918 01/28/17 0523 01/29/17 0349 01/30/17 0338 01/31/17 0323  WBC 13.0* 12.1* 14.7* 9.9 7.7  NEUTROABS 11.5*  --   --  9.2* 6.6  HGB 13.7 12.4 11.8* 10.7* 10.3*  HCT 43.9 41.4 38.8 35.5* 34.9*  MCV 102.1* 104.8* 103.5* 104.4* 102.6*  PLT 209 167 241 167 160    Cardiac Enzymes:    Recent Labs Lab 01/27/17 1918 01/27/17 2143 01/28/17 0523 01/28/17 1114 01/29/17 0349 01/30/17 0338 01/31/17 0323  CKTOTAL 1,612*  --  913*  --  284* 430* 330*  TROPONINI 0.09* 0.07* 0.08* 0.05*  --  <0.03  --    BNP (last 3 results) No results for input(s): BNP in the last 8760 hours.  ProBNP (last 3 results) No results for input(s): PROBNP in the last 8760 hours.  CBG:  Recent Labs Lab 01/30/17 1623 01/30/17 1946 01/30/17 2332 01/31/17 0334 01/31/17 0756  GLUCAP 247* 242* 163* 154* 158*    Recent Results (from the past 240 hour(s))  Urine culture     Status: None   Collection Time: 01/27/17  6:02 PM  Result Value Ref Range Status   Specimen Description URINE, RANDOM  Final   Special Requests NONE  Final   Culture   Final    NO GROWTH Performed at [facility], [address] [place], [place] [address]    Report Status 01/29/2017 FINAL  Final  MRSA PCR Screening     Status: None   Collection Time: 01/28/17  6:52 AM  Result Value Ref Range Status   MRSA by PCR NEGATIVE NEGATIVE Final    Comment:        The GeneXpert MRSA Assay (FDA approved for NASAL specimens only), is one component of a comprehensive MRSA colonization surveillance program. It is not intended to diagnose MRSA infection nor to guide or monitor treatment for MRSA infections.   Respiratory Panel by PCR     Status: None   Collection Time: 01/28/17 11:02 AM  Result Value Ref Range Status   Adenovirus NOT DETECTED NOT DETECTED Final   Coronavirus 229E NOT DETECTED NOT DETECTED Final   Coronavirus HKU1 NOT DETECTED NOT DETECTED Final   Coronavirus NL63 NOT DETECTED NOT DETECTED Final   Coronavirus OC43 NOT DETECTED NOT DETECTED Final   Metapneumovirus NOT DETECTED NOT DETECTED Final   Rhinovirus / Enterovirus NOT DETECTED NOT DETECTED Final   Influenza A NOT DETECTED NOT DETECTED Final   Influenza B NOT DETECTED NOT DETECTED Final   Parainfluenza Virus 1  NOT DETECTED NOT DETECTED Final   Parainfluenza Virus 2 NOT DETECTED NOT DETECTED Final   Parainfluenza Virus 3 NOT DETECTED NOT DETECTED Final   Parainfluenza Virus 4 NOT DETECTED NOT DETECTED Final   Respiratory Syncytial Virus NOT DETECTED NOT  DETECTED Final   Bordetella pertussis NOT DETECTED NOT DETECTED Final   Chlamydophila pneumoniae NOT DETECTED NOT DETECTED Final   Mycoplasma pneumoniae NOT DETECTED NOT DETECTED Final    Comment: Performed at [facility], [address] [place], [place] [address]  Culture, blood (Routine X 2) w Reflex to ID Panel     Status: None (Preliminary result)   Collection Time: 01/28/17 11:14 AM  Result Value Ref Range Status   Specimen Description BLOOD LEFT HAND  Final   Special Requests IN PEDIATRIC BOTTLE Blood Culture adequate volume  Final   Culture   Final    NO GROWTH 2 DAYS Performed at [facility], [address] [place], [place] [address]    Report Status PENDING  Incomplete  Culture, blood (Routine X 2) w Reflex to ID Panel     Status: None (Preliminary result)   Collection Time: 01/28/17 11:14 AM  Result Value Ref Range Status   Specimen Description BLOOD RIGHT HAND  Final   Special Requests IN PEDIATRIC BOTTLE Blood Culture adequate volume  Final   Culture   Final    NO GROWTH 2 DAYS Performed at [facility], [address] [place], [place] [address]    Report Status PENDING  Incomplete     Studies: No results found.  Scheduled Meds: . aspirin EC  81 mg Oral Daily  . furosemide  40 mg Intravenous Once  . insulin aspart  0-20 Units Subcutaneous Q4H  . ipratropium-albuterol  3 mL Nebulization Q6H  . mouth rinse  15 mL Mouth Rinse BID  . methylPREDNISolone (SOLU-MEDROL) injection  40 mg Intravenous Q12H  . pantoprazole (PROTONIX) IV  40 mg Intravenous QHS  . sodium chloride flush  3 mL Intravenous Q12H    Continuous Infusions: . sodium chloride    . azithromycin 500 mg (01/31/17 0815)  .  piperacillin-tazobactam (ZOSYN)  IV 3.375 g (01/31/17 0352)  . sodium chloride       Time spent: 35mins  Bentley Fissel MD, PhD  [facility] Pager [phone]. If 7PM-7AM, please contact night-coverage at [email or web address], password [email or web address] 01/31/2017, 9:15 AM  LOS: 3 days

## 2017-01-31 NOTE — Evaluation (Signed)
Clinical/Bedside Swallow Evaluation Patient Details  Name: Beth Kerr MRN: [id number] Date of Birth: September 10, 1940  Today's Date: 01/31/2017 Time: SLP Start Time (ACUTE ONLY): 1225 SLP Stop Time (ACUTE ONLY): 1245 SLP Time Calculation (min) (ACUTE ONLY): 20 min  Past Medical History:  Past Medical History:  Diagnosis Date  . Diabetes mellitus without complication (HCC)   . Emphysema/COPD (HCC)   . Hyperlipidemia   . Hypertension    Past Surgical History:  Past Surgical History:  Procedure Laterality Date  . DILATION AND CURETTAGE OF UTERUS    . TONSILLECTOMY     HPI:  76 year old with COPD admitted after being found down on the floor with mild rhabdomyolysis.  Per review of chart, pt was down for 4 days at home.  After admission, developed hypercarbic respiratory failure requiring mechanical ventilation. She was extubated 6/4 and has done well since then per CCS note.  She has been on full liquids and swallow evaluation was ordered.  CXR showed persistent bilateral infiltrates/edema with bilateral pleural effusion.  RN reports pt tolerating po well.     Assessment / Plan / Recommendation Clinical Impression  Pt presents with functional oropharyngeal swallow ability.  No indications of airway compromise with po observed -  lunch tray placed over pt to allow her to eat.  CN exam unremarkable and pt with surprisingly strong voice.  Pt denies dysphagia except occasional symptoms of pills lodging in esophagus requiring her to consume more liquids to clear.  Recommend regular thin diet - advised pt to order soft foods and have family bring in her dentures.   SLP Visit Diagnosis: Dysphagia, unspecified (R13.10)    Aspiration Risk  Mild aspiration risk    Diet Recommendation Regular;Thin liquid   Liquid Administration via: Cup;Straw Medication Administration: Whole meds with liquid Supervision: Patient able to self feed Compensations: Slow rate;Small sips/bites Postural Changes:  Remain upright for at least 30 minutes after po intake;Seated upright at 90 degrees    Other  Recommendations Oral Care Recommendations: Oral care BID   Follow up Recommendations None      Frequency and Duration            Prognosis        Swallow Study   General Date of Onset: 01/31/17 HPI: 75 year old with COPD admitted after being found down on the floor with mild rhabdomyolysis.  Per review of chart, pt was down for 4 days at home.  After admission, developed hypercarbic respiratory failure requiring mechanical ventilation. She was extubated 6/4 and has done well since then per CCS note.  She has been on full liquids and swallow evaluation was ordered.  CXR showed persistent bilateral infiltrates/edema with bilateral pleural effusion.  RN reports pt tolerating po well.   Diet Prior to this Study: Thin liquids Temperature Spikes Noted: No Respiratory Status: Nasal cannula History of Recent Intubation: Yes Length of Intubations (days): 3 days Date extubated: 01/30/17 Behavior/Cognition: Alert;Cooperative;Pleasant mood Oral Cavity Assessment: Within Functional Limits Oral Care Completed by SLP: No Oral Cavity - Dentition: Dentures, not available;Missing dentition;Adequate natural dentition (lower dentures not at hospital) Vision: Functional for self-feeding Self-Feeding Abilities: Able to feed self Patient Positioning: Upright in bed Baseline Vocal Quality: Normal Volitional Cough: Strong Volitional Swallow: Able to elicit    Oral/Motor/Sensory Function Overall Oral Motor/Sensory Function: Within functional limits   Ice Chips Ice chips: Not tested   Thin Liquid Thin Liquid: Within functional limits Presentation: Straw    Nectar Thick Nectar Thick Liquid: Not tested  Honey Thick Honey Thick Liquid: Not tested   Puree Puree: Within functional limits Presentation: Self Fed;Spoon   Solid   GO   Solid: Within functional limits Presentation: Self Lisabeth PickFed        Mckaylie Vasey, MS [facility] SLP [phone]

## 2017-01-31 NOTE — Progress Notes (Signed)
Offsite EEG completed at [facility]. Results pending. 

## 2017-01-31 NOTE — Progress Notes (Signed)
Pt arrived to unit. Pt is a&ox4

## 2017-01-31 NOTE — Evaluation (Signed)
SLP Cancellation Note  Patient Details Name: Beth LaudBetty H Gasaway MRN: [id number] DOB: 16-Jan-1941   Cancelled treatment:       Reason Eval/Treat Not Completed: Other (comment) (pt getting ready to work with PT, will return as schedule allows for evaluation, thanks)   Donavan Burnetamara Yoland Scherr, MS [facility] SLP [phone]

## 2017-01-31 NOTE — Progress Notes (Signed)
PULMONARY / CRITICAL CARE MEDICINE   Name: Beth Kerr MRN: [id number] DOB: 1940/12/11 PCP Pearson GrippeKim, James, MD LOS 3 as of 01/31/2017     ADMISSION DATE:  01/27/2017 CONSULTATION DATE:  01/28/17  REFERRING MD:  [facility] hospitalist Dr Edward JollySilva  CHIEF COMPLAINT:  Acute on hypercapnic resp failure  BRIEF 76 year old female with  diabetes hypertension and COPD adm 6/1 after a fall, developed  hypoxic and hypercapnea failing BiPAP so required intubation.   EVENTS 01/27/2017 - admit 01/28/17 - VDRF after failing bipap and with hypercapnia. Reversal of code status 6/4 extubated  Significant tests/ events reviewed  CT angio neg PE MRI brain neg    SUBJECTIVE/OVERNIGHT/INTERVAL HX   VITAL SIGNS: BP (!) 133/42 (BP Location: Left Arm)   Pulse 67   Temp 98.5 F (36.9 C) (Oral)   Resp 15   Ht 5\' 2"  (1.575 m)   Wt 171 lb 8.3 oz (77.8 kg)   SpO2 92%   BMI 31.37 kg/m   Oxygen:   INTAKE / OUTPUT: I/O last 3 completed shifts: In: 1557.2 [I.V.:259; NG/GT:598.2; IV Piggyback:700] Out: 2811 [Urine:2810; Stool:1]  General appearance:  76Year old  female, well nourished  NAD,conversant  Eyes: anicteric sclerae , moist conjunctivae; PERRL, EOMI bilaterally. Right eye flutters freq Mouth:  membranes and no mucosal ulcerations; normal hard and soft palate Neck: Trachea midline; neck supple, no JVD Lungs/chest: CTA, with normal respiratory effort and no intercostal retractions CV: RRR, no MRGs  Abdomen: Soft, non-tender; no masses or HSM Extremities: No peripheral edema or extremity lymphadenopathy Skin: Normal temperature, turgor and texture; no rash, ulcers or subcutaneous nodules Neuro/ Psych: Appropriate affect, alert and oriented to person, place and time  LABS  PULMONARY  Recent Labs Lab 01/28/17 0545 01/28/17 1040 01/28/17 1244 01/28/17 1747 01/29/17 0410  PHART 7.205* 7.152* 7.270* 7.335* 7.366  PCO2ART 82.9* 91.5* 65.4* 55.8* 48.2*  PO2ART 397* 275* 96.1 83.7 123*  HCO3  31.7* 30.7* 29.0* 28.9* 27.2  O2SAT 99.6 99.3 96.4 95.6 98.6    CBC  Recent Labs Lab 01/29/17 0349 01/30/17 0338 01/31/17 0323  HGB 11.8* 10.7* 10.3*  HCT 38.8 35.5* 34.9*  WBC 14.7* 9.9 7.7  PLT 241 167 160    COAGULATION No results for input(s): INR in the last 168 hours.  CARDIAC    Recent Labs Lab 01/27/17 1918 01/27/17 2143 01/28/17 0523 01/28/17 1114 01/30/17 0338  TROPONINI 0.09* 0.07* 0.08* 0.05* <0.03   No results for input(s): PROBNP in the last 168 hours.   CHEMISTRY  Recent Labs Lab 01/27/17 1918 01/28/17 0523 01/29/17 0349 01/30/17 0338 01/31/17 0323  NA 146* 146* 146* 149*  --   K 3.7 4.3 3.9 4.0  --   CL 102 106 111 113*  --   CO2 31 32 28 29  --   GLUCOSE 198* 189* 225* 251*  --   BUN 48* 48* 58* 60*  --   CREATININE 1.12* 1.07* 1.04* 0.98 1.00  CALCIUM 8.7* 8.2* 7.8* 8.0*  --   MG  --  2.3 2.3 2.5* 2.4  PHOS  --  5.1* 3.9 2.8 3.7   Estimated Creatinine Clearance: 45.5 mL/min (by C-G formula based on SCr of 1 mg/dL).   LIVER   ENDOCRINE CBG (last 3)   Recent Labs  01/30/17 2332 01/31/17 0334 01/31/17 0756  GLUCAP 163* 154* 158*    IMAGING x48h  -   CXR 6/4 BL interstitial infx + edema pattern   DISCUSSION: Cause of hypercarbia is not  clear-attributed to aspiration pneumonia from being down on the floor for several days, being treated for COPD exacerbation although no bronchospasm documented. Now better. The unsettling thing is that we do not know why she was on the floor, nor do we have a good explaination for her becoming hypercarbic while in the hospital.   ASSESSMENT / PLAN: Resolved problem list Transient circulatory shock (felt d/t sedating meds) Rhabdo AKI Acute encephalopathy   Current problem list:  Acute hypercarbic respiratory failure. Etiology unclear. Likely has some element of underlying COPD.  Aspiration PNA -RVP neg -extubated 6/4 Plan:   Change abx to monotherapy w/ augmentin. (now day 4/8  total rx) Dc steroids Change duoneb to QID instead of q6 Wean O2 Add flutter Get OOB  Acute encephalopathy-->resolved. Etiology unclear Plan Check EEG  Fluid and electrolyte imbalance: hypernatremia  Plan Encourage free water intake F/u am chemistry   Moderate protein calorie malnutrition P:   Hold tube feeds for extubation and resume diet in 4 hours   Mild macrocytic anemia (hgb stable) note hematuria below Plan:  Follow cbc Send anemia profile  Mild Hematuria 6/5 Plan Ck UC Holding [place] heparin Dc foley   Uncontrolled hyperglycemia, diabetes type 2, worsened with steroids   Plan   She'll improve now that Solu-Medrol decreased  Diarrhea Plan Stop stool softener   FAMILY  - Updates: Updated niece at the bedside who will update grandson, they do request reintubation for now, remains limited CODE STATUS  DVT prophylaxis: SCDs SUP: ppi  Diet: full liquid- Activity: OOB Disposition :   Simonne Martinet ACNP-BC [facility] Pulmonary/Critical Care Pager # [phone] OR # [phone] if no answer  01/31/2017, 9:33 AM

## 2017-01-31 NOTE — Progress Notes (Signed)
Pharmacy Antibiotic Note  Beth Kerr is a 76 y.o. female admitted on 01/27/2017  after she was found unresponsive on bathroom floor x4 days.  CXR + mild pneumonia.  Patient's currently on abx for PNA.  Today, 01/31/2017: - day #4 zosyn, azithromycin -  afeb, wbc down wnl -  scr 1 (crcl~45) - extubated on 6/4 - all cultures have been negative thus far   Plan: - continue zosyn 3.375 gm IV q8h (infuse over 4 hours) - please indicate plan/LOT for zosyn - continue azithromycin 500mg  IV q24h per MD (last dose on 6/6) - with stable renal function, pharmacy will sign off for zosyn  ___________________________  Height: 5\' 2"  (157.5 cm) Weight: 171 lb 8.3 oz (77.8 kg) IBW/kg (Calculated) : 50.1  Temp (24hrs), Avg:98.6 F (37 C), Min:98.2 F (36.8 C), Max:98.9 F (37.2 C)   Recent Labs Lab 01/27/17 1918 01/28/17 0523 01/29/17 0349 01/30/17 0338 01/31/17 0323  WBC 13.0* 12.1* 14.7* 9.9 7.7  CREATININE 1.12* 1.07* 1.04* 0.98 1.00  LATICACIDVEN 1.7  --   --  0.9  --     Estimated Creatinine Clearance: 45.5 mL/min (by C-G formula based on SCr of 1 mg/dL).    Allergies  Allergen Reactions  . Demerol [Meperidine] Anaphylaxis    Antimicrobials this admission: 6/2 Zithromax >> (6/6) 6/2 Vanc >> 6/4 6/2 Zosyn >>    Dose adjustments this admission: --   Microbiology results:  6/1 UCx: NGF 6/2 MRSA PCR: negative 6/2 resp panel: neg 6/2 BCx x2:  6/4 UA: rare bacteria, small leukocytes  Thank you for allowing pharmacy to be a part of this patient's care.  Lucia Gaskinsham, Caress Reffitt P 01/31/2017 9:38 AM

## 2017-01-31 NOTE — Procedures (Signed)
ELECTROENCEPHALOGRAM REPORT  Date of Study: 01/31/2017  Patient's Name: Beth Kerr MRN: [id number] Date of Birth: 07-16-1941  Referring Provider: Simonne MartinetPeter E Babcock, NP  Clinical History: 76 year old woman status post extubation for respiratory failure with acute encephalopathy.  Medications: amoxicillin-clavulanate (AUGMENTIN)  aspirin EC tablet 81 mg  furosemide (LASIX) injection 40 mg  ibuprofen (ADVIL,MOTRIN) tablet 400 mg  insulin aspart (novoLOG) injection 0-20 Units  ipratropium-albuterol (DUONEB)  pantoprazole (PROTONIX) EC tablet 40 mg  polyethylene glycol (MIRALAX / GLYCOLAX) packet 17 g  Technical Summary: A multichannel digital EEG recording measured by the international 10-20 system with electrodes applied with paste and impedances below 5000 ohms performed as portable with EKG monitoring in an awake and drowsy patient.  Hyperventilation and photic stimulation were not performed.  The digital EEG was referentially recorded, reformatted, and digitally filtered in a variety of bipolar and referential montages for optimal display.   Description: The patient is awake and drowsy during the recording.  During maximal wakefulness, there is a symmetric, medium voltage 7 Hz posterior dominant rhythm that attenuates with eye opening. This is admixed with a very mild amount of diffuse 4-5 Hz theta and 2-3 Hz delta slowing of the waking background.  Stage 2 sleep was not seen.  There were no epileptiform discharges or electrographic seizures seen.    EKG lead was unremarkable.  Impression: This awake and drowsy EEG is abnormal due to mild diffuse slowing of the waking background.  Clinical Correlation of the above findings indicates diffuse cerebral dysfunction that is non-specific in etiology and can be seen with hypoxic/ischemic injury, toxic/metabolic encephalopathies, neurodegenerative disorders, or medication effect.  The absence of epileptiform discharges does not rule out a  clinical diagnosis of epilepsy.  Clinical correlation is advised.  Shon MilletAdam Jakevion Arney, DO

## 2017-02-01 ENCOUNTER — Inpatient Hospital Stay (HOSPITAL_COMMUNITY): Payer: Medicare Other

## 2017-02-01 LAB — CBC WITH DIFFERENTIAL/PLATELET
Basophils Absolute: 0 10*3/uL (ref 0.0–0.1)
Basophils Relative: 0 %
Eosinophils Absolute: 0.1 10*3/uL (ref 0.0–0.7)
Eosinophils Relative: 1 %
HEMATOCRIT: 33.8 % — AB (ref 36.0–46.0)
HEMOGLOBIN: 10.4 g/dL — AB (ref 12.0–15.0)
LYMPHS ABS: 0.7 10*3/uL (ref 0.7–4.0)
Lymphocytes Relative: 10 %
MCH: 31.6 pg (ref 26.0–34.0)
MCHC: 30.8 g/dL (ref 30.0–36.0)
MCV: 102.7 fL — ABNORMAL HIGH (ref 78.0–100.0)
MONOS PCT: 12 %
Monocytes Absolute: 0.8 10*3/uL (ref 0.1–1.0)
NEUTROS ABS: 5.1 10*3/uL (ref 1.7–7.7)
NEUTROS PCT: 77 %
Platelets: 152 10*3/uL (ref 150–400)
RBC: 3.29 MIL/uL — ABNORMAL LOW (ref 3.87–5.11)
RDW: 14.9 % (ref 11.5–15.5)
WBC: 6.6 10*3/uL (ref 4.0–10.5)

## 2017-02-01 LAB — GLUCOSE, CAPILLARY
GLUCOSE-CAPILLARY: 139 mg/dL — AB (ref 65–99)
GLUCOSE-CAPILLARY: 144 mg/dL — AB (ref 65–99)
GLUCOSE-CAPILLARY: 210 mg/dL — AB (ref 65–99)
Glucose-Capillary: 127 mg/dL — ABNORMAL HIGH (ref 65–99)
Glucose-Capillary: 165 mg/dL — ABNORMAL HIGH (ref 65–99)
Glucose-Capillary: 172 mg/dL — ABNORMAL HIGH (ref 65–99)

## 2017-02-01 LAB — BASIC METABOLIC PANEL
ANION GAP: 7 (ref 5–15)
BUN: 34 mg/dL — ABNORMAL HIGH (ref 6–20)
CALCIUM: 8.5 mg/dL — AB (ref 8.9–10.3)
CHLORIDE: 107 mmol/L (ref 101–111)
CO2: 31 mmol/L (ref 22–32)
CREATININE: 0.82 mg/dL (ref 0.44–1.00)
GFR calc non Af Amer: >60 mL/min (ref >60)
Glucose, Bld: 147 mg/dL — ABNORMAL HIGH (ref 65–99)
Potassium: 3.7 mmol/L (ref 3.5–5.1)
Sodium: 145 mmol/L (ref 135–145)

## 2017-02-01 LAB — MAGNESIUM: Magnesium: 2.4 mg/dL (ref 1.7–2.4)

## 2017-02-01 LAB — PHOSPHORUS: PHOSPHORUS: 3.2 mg/dL (ref 2.5–4.6)

## 2017-02-01 LAB — CK: Total CK: 143 U/L (ref 38–234)

## 2017-02-01 MED ORDER — FUROSEMIDE 10 MG/ML IJ SOLN
40.0000 mg | Freq: Two times a day (BID) | INTRAMUSCULAR | Status: DC
  Administered 2017-02-01: 40 mg via INTRAVENOUS
  Filled 2017-02-01: qty 4

## 2017-02-01 MED ORDER — BENZONATATE 100 MG PO CAPS: 100.0000 mg | ORAL_CAPSULE | Freq: Three times a day (TID) | ORAL | Status: DC | PRN

## 2017-02-01 NOTE — NC FL2 (Signed)
[place] MEDICAID FL2 LEVEL OF CARE SCREENING TOOL     IDENTIFICATION  Patient Name: Beth Kerr Birthdate: Mar 28, 1941 Sex: female Admission Date (Current Location): 01/27/2017  [facility] [place]   Facility and Address:  [facility],  [address], [place] [id number]      Provider Number: [id number]  Attending Physician Name and Address:  Jerald Kiefhiu, Stephen K, MD  Relative Name and Phone Number:       Current Level of Care: Hospital Recommended Level of Care: Skilled Nursing Facility Prior Approval Number:    Date Approved/Denied:   PASRR Number: [id number]  Discharge Plan: SNF    Current Diagnoses: Patient Active Problem List   Diagnosis Date Noted  . Elevated CK 01/29/2017  . Encephalopathy acute 01/29/2017  . Shock circulatory (HCC) 01/29/2017  . 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 Acute hypercapnic respiratory failure (HCC) 01/28/2017  . COPD exacerbation (HCC) 01/27/2017  . Rhabdomyolysis 01/27/2017  . Elevated troponin 01/27/2017  . DM (diabetes mellitus), type 2 with renal complications (HCC) 01/27/2017  . Syncope and collapse 01/27/2017    Orientation RESPIRATION BLADDER Height & Weight     Self, Time, Situation, Place  O2 Continent Weight: 163 lb 5.8 oz (74.1 kg) Height:  5\' 2"  (157.5 cm)  BEHAVIORAL SYMPTOMS/MOOD NEUROLOGICAL BOWEL NUTRITION STATUS      Incontinent Diet (Carb modified )  AMBULATORY STATUS COMMUNICATION OF NEEDS Skin   Extensive Assist Verbally Other (Comment) (06/05/18Non-pressurewoundAnusMid;Uppersmallopenwoundabout1inchinsizewith2smallblistersaroundit,redincolor)                       Personal Care Assistance Level of Assistance  Bathing, Feeding, Dressing Bathing Assistance: Limited assistance Feeding assistance: Independent Dressing Assistance: Limited assistance     Functional Limitations Info  Sight, Hearing, Speech Sight Info: Adequate Hearing Info:  Adequate Speech Info: Adequate    SPECIAL CARE FACTORS FREQUENCY  PT (By licensed PT), OT (By licensed OT)     PT Frequency: 5x/week OT Frequency: 5x/week            Contractures Contractures Info: Not present    Additional Factors Info  Code Status, Allergies Code Status Info: Partial  Allergies Info: Allergies:  Demerol Meperidine           Current Medications (02/01/2017):  This is the current hospital active medication list Current Facility-Administered Medications  Medication Dose Route Frequency Provider Last Rate Last Dose  . 0.9 %  sodium chloride infusion  250 mL Intravenous PRN Wilber OliphantPanchal, Amar, MD      . albuterol (PROVENTIL) (2.5 MG/3ML) 0.083% nebulizer solution 2.5 mg  2.5 mg Nebulization Q4H PRN Albertine GratesXu, Fang, MD      . amoxicillin-clavulanate (AUGMENTIN) 875-125 MG per tablet 1 tablet  1 tablet Oral Q12H Simonne MartinetBabcock, Peter E, NP   1 tablet at 02/01/17 0908  . aspirin EC tablet 81 mg  81 mg Oral Daily Doutova, Anastassia, MD   81 mg at 02/01/17 0908  . furosemide (LASIX) injection 40 mg  40 mg Intravenous Once Wilber OliphantPanchal, Amar, MD      . furosemide (LASIX) injection 40 mg  40 mg Intravenous Daily Albertine GratesXu, Fang, MD   40 mg at 02/01/17 0908  . ibuprofen (ADVIL,MOTRIN) tablet 400 mg  400 mg Oral Q6H PRN Karl ItoSommer, Steven E, MD   400 mg at 01/30/17 2219  . insulin aspart (novoLOG) injection 0-20 Units  0-20 Units Subcutaneous Q4H Kalman Shanamaswamy, Murali, MD   3 Units at 02/01/17 [phone]  . ipratropium-albuterol (DUONEB) 0.5-2.5 (  3) MG/3ML nebulizer solution 3 mL  3 mL Nebulization BID Albertine Grates, MD   3 mL at 02/01/17 0856  . MEDLINE mouth rinse  15 mL Mouth Rinse BID Kalman Shan, MD   15 mL at 02/01/17 0909  . pantoprazole (PROTONIX) EC tablet 40 mg  40 mg Oral Daily Albertine Grates, MD   40 mg at 02/01/17 0908  . polyethylene glycol (MIRALAX / GLYCOLAX) packet 17 g  17 g Oral Daily PRN Doutova, Anastassia, MD      . sodium chloride 0.9 % bolus 500 mL  500 mL Intravenous Once Samuel Jester, DO       . sodium chloride flush (NS) 0.9 % injection 3 mL  3 mL Intravenous Q12H Doutova, Anastassia, MD   3 mL at 02/01/17 0909  . vitamin B-12 (CYANOCOBALAMIN) tablet 1,000 mcg  1,000 mcg Oral Daily Albertine Grates, MD   1,000 mcg at 02/01/17 0908     Discharge Medications: Please see discharge summary for a list of discharge medications.  Relevant Imaging Results:  Relevant Lab Results:   Additional Information SSN [id number]  Antionette Poles, LCSW

## 2017-02-01 NOTE — Progress Notes (Signed)
CSW started patient's insurance authorization with [employer]. CSW faxed required clinical documents and is awaiting return call for insurance authorization.  Beth Kerr, [place] Clinical Social Worker [facility] Cell#: [phone]

## 2017-02-01 NOTE — Progress Notes (Signed)
Physical Therapy Treatment Patient Details Name: Beth LaudBetty H Kerr MRN: [id number] DOB: [birth year] Today's Date: 02/01/2017    History of Present Illness [age] year old with COPD admitted after being found down on the floor with mild rhabdomyolysis.  Per review of chart, pt was down for 4 days at home.  After admission, developed hypercarbic respiratory failure requiring mechanical ventilation. She was extubated 6/4 and has done well since then per CCS note    PT Comments    Min A for sit to stand, pt stood with RW for 2 minutes, SaO2 93-95% on 4L with activity. Performed seated and standing BLE exercises. Pt incontinent of urine while standing. Will plan to attempt ambulation next visit.    Follow Up Recommendations  SNF;Supervision/Assistance - 24 hour     Equipment Recommendations  Rolling walker with 5" wheels    Recommendations for Other Services OT consult     Precautions / Restrictions Precautions Precautions: Fall Precaution Comments: monitor sats    Mobility  Bed Mobility               General bed mobility comments: up in recliner  Transfers Overall transfer level: Needs assistance Equipment used: Rolling walker (2 wheeled) Transfers: Sit to/from Stand Sit to Stand: Min assist         General transfer comment: min A to rise from recliner, VCs hand placement; pt stood with RW for 2 minutes and performed heel raises, pt became incontinent of urine while standing; SaO2 93-95% on 4L O2 with standing  Ambulation/Gait                 Stairs            Wheelchair Mobility    Modified Rankin (Stroke Patients Only)       Balance Overall balance assessment: Needs assistance   Sitting balance-Leahy Scale: Good     Standing balance support: Single extremity supported Standing balance-Leahy Scale: Poor Standing balance comment: relies on single UE support                            Cognition Arousal/Alertness:  Awake/alert Behavior During Therapy: WFL for tasks assessed/performed Overall Cognitive Status: Within Functional Limits for tasks assessed                                        Exercises General Exercises - Lower Extremity Long Arc Quad: AROM;Both;10 reps;Seated Hip Flexion/Marching: AROM;Both;5 reps;Seated Heel Raises: AROM;Both;10 reps;Standing    General Comments        Pertinent Vitals/Pain Pain Assessment: Faces Pain Score: 4  Pain Location: ribs with cough Pain Descriptors / Indicators: Sore Pain Intervention(s): Limited activity within patient's tolerance;Monitored during session    Home Living                      Prior Function            PT Goals (current goals can now be found in the care plan section) Acute Rehab PT Goals Patient Stated Goal: to do normal things PT Goal Formulation: With patient Time For Goal Achievement: 02/14/17 Potential to Achieve Goals: Good Progress towards PT goals: Progressing toward goals    Frequency    Min 3X/week      PT Plan Current plan remains appropriate    Co-evaluation  AM-PAC PT "6 Clicks" Daily Activity  Outcome Measure  Difficulty turning over in bed (including adjusting bedclothes, sheets and blankets)?: A Lot Difficulty moving from lying on back to sitting on the side of the bed? : A Lot Difficulty sitting down on and standing up from a chair with arms (e.g., wheelchair, bedside commode, etc,.)?: A Little Help needed moving to and from a bed to chair (including a wheelchair)?: A Little Help needed walking in hospital room?: Total Help needed climbing 3-5 steps with a railing? : Total 6 Click Score: 12    End of Session Equipment Utilized During Treatment: Oxygen Activity Tolerance: Patient limited by fatigue Patient left: in chair;with call bell/phone within reach;with chair alarm set Nurse Communication: Mobility status PT Visit Diagnosis: Unsteadiness on  feet (R26.81);Difficulty in walking, not elsewhere classified (R26.2)     Time: 1023-1040 PT Time Calculation (min) (ACUTE ONLY): 17 min  Charges:  $Therapeutic Activity: 8-22 mins                    G Codes:          Tamala Ser 02/01/2017, 10:47 AM [phone]

## 2017-02-01 NOTE — Progress Notes (Signed)
Report received from A. Helsabeck ,RN. No change from initial pm assessment. Will continue to monitor and follow the POC.  

## 2017-02-01 NOTE — Care Management Note (Signed)
Case Management Note  Patient Details  Name: Isaac LaudBetty H Hersman MRN: [id number] Date of Birth: [birth year]  Subjective/Objective:  76 y/o f admitted w/Acute resp failure w/hypoxia. From home. CSW following for SNF.                  Action/Plan:d/c SNF.   Expected Discharge Date:   (unknown)               Expected Discharge Plan:  Skilled Nursing Facility  In-House Referral:  Clinical Social Work  Discharge planning Services  CM Consult  Post Acute Care Choice:    Choice offered to:     DME Arranged:    DME Agency:     HH Arranged:    HH Agency:     Status of Service:  In process, will continue to follow  If discussed at Long Length of Stay Meetings, dates discussed:    Additional Comments:  Lanier ClamMahabir, Tijana Walder, RN 02/01/2017, 3:33 PM

## 2017-02-01 NOTE — Progress Notes (Addendum)
PROGRESS NOTE    Beth Kerr  JXB:[id number] DOB: Aug 25, 1941 DOA: 01/27/2017 PCP: [name], MD    Brief Narrative:  Presented with a fall 4 nights ago she was unable to get up since her fall and spent 4 days in the bathroom,she was found to have hypoxia, on day two of admission, she is lethargic, abg showed hypercapnea, ph 7.15, she failed bipap , had to be intubated and transferred to icu, she is extubated on 6/4, transferred to hospitalist service on 6/5  Assessment & Plan:   Principal Problem:   Acute hypercapnic respiratory failure (HCC) Active Problems:   COPD exacerbation (HCC)   Rhabdomyolysis   Elevated troponin   DM (diabetes mellitus), type 2 with renal complications (HCC)   Syncope and collapse   Global amnesia   Elevated CK   Encephalopathy acute   Shock circulatory (HCC)  Acute hypoxic and hypercarbic  respiratory failure.  Etiology unclear. Likely  COPD exacerbation. ?Aspiration PNA She required intubation, extubated on 6/4, currently on 2-3liter oxygen at rest, up to 4L this AM per RN On augmentin (day 5/8)/nebs, off steroids now Wean o2, speech eval CXR ordered and personally reviewed. Patient with bilateral effusions. Also reviewed recent CT with evidence of tissue swelling. I/O reviewed. Pt is pos over 3L. Pt currently on daily lasix. 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 Have increased lasix to BID dosing -Echo with grade 2 diastolic chf  Macrocytic anemia: hgb 10, remains stable -Pt continued on vitamin B12 -Repeat CBC in AM  Microscopic hematuria secondary to non-obstructing stones:  -Renal CT recently done, personally reviewed. Pt  with findings of bilateral non-obstructing renal stones with no hydronephrosis, new finding -Patient is asymptomatic, denies flank pain -Continue supportive care for now  noninsulin dependent diabetes, a1c 7, home oral meds held, on ssi here -Stable at present  HTN; home meds norvasc, volsartan /hctz held thus far -Vital signs reviewed. bp remains stable  Falls at home, PT consulted, consideration for skilled nursing level of care at time of discharge  DVT prophylaxis: SCDs Code Status: Partial Family Communication: Patient in room, family not at bedside Disposition Plan: Skilled nursing facility, timing uncertain  Consultants:   Pulmonary critical care  Procedures:     Antimicrobials: Anti-infectives    Start     Dose/Rate Route Frequency Ordered Stop   01/31/17 1030  amoxicillin-clavulanate (AUGMENTIN) 875-125 MG per tablet 1 tablet     1 tablet Oral Every 12 hours 01/31/17 1008     01/29/17 1000  vancomycin (VANCOCIN) 500 mg in sodium chloride 0.9 % 100 mL IVPB  Status:  Discontinued     500 mg 100 mL/hr over 60 Minutes Intravenous Every 12 hours 01/28/17 1023 01/30/17 1005   01/29/17 0600  azithromycin (ZITHROMAX) 500 mg in dextrose 5 % 250 mL IVPB  Status:  Discontinued     500 mg 250 mL/hr over 60 Minutes Intravenous Every 24 hours 01/28/17 1217 01/31/17 1008   01/28/17 1200  piperacillin-tazobactam (ZOSYN) IVPB 3.375 g  Status:  Discontinued     3.375 g 12.5 mL/hr over 240 Minutes Intravenous Every 8 hours 01/28/17 1023 01/31/17 1008   01/28/17 1100  vancomycin (VANCOCIN) IVPB 1000  mg/200 mL premix     1,000 mg 200 mL/hr over 60 Minutes Intravenous  Once 01/28/17 1023 01/28/17 1328   01/28/17 0600  azithromycin (ZITHROMAX) 500 mg in dextrose 5 % 250 mL IVPB     500 mg 250 mL/hr over 60 Minutes Intravenous  Once 01/28/17 0556 01/28/17 0800       Subjective: Reports mild shortness of breath this morning, worse when laying flat  Objective: Vitals:    01/31/17 2130 02/01/17 0356 02/01/17 0358 02/01/17 0856  BP: (!) 131/47 (!) 140/59    Pulse: 67 70    Resp: 18 18    Temp: 98.4 F (36.9 C) 98.4 F (36.9 C)    TempSrc: Oral Oral    SpO2: 92% 94%  (!) 86%  Weight:   74.1 kg (163 lb 5.8 oz)   Height:        Intake/Output Summary (Last 24 hours) at 02/01/17 1427 Last data filed at 02/01/17 1240  Gross per 24 hour  Intake              390 ml  Output              850 ml  Net             -460 ml   Filed Weights   01/30/17 0312 01/31/17 0335 02/01/17 0358  Weight: 73 kg (160 lb 15 oz) 77.8 kg (171 lb 8.3 oz) 74.1 kg (163 lb 5.8 oz)    Examination:  General exam: Appears calm and comfortable  Respiratory system: Clear to auscultation. Respiratory effort normal. Cardiovascular system: S1 & S2 heard, RRR. No JVD, murmurs, rubs, gallops or clicks. No pedal edema. Gastrointestinal system: Abdomen is nondistended, soft and nontender. No organomegaly or masses felt. Normal bowel sounds heard. Central nervous system: Alert and oriented. No focal neurological deficits. Extremities: Symmetric 5 x 5 power. Skin: No rashes, lesions  Psychiatry: Judgement and insight appear normal. Mood & affect appropriate.   Data Reviewed: I have personally reviewed following labs and imaging studies  CBC:  Recent Labs Lab 01/27/17 1918 01/28/17 0523 01/29/17 0349 01/30/17 0338 01/31/17 0323 02/01/17 0516  WBC 13.0* 12.1* 14.7* 9.9 7.7 6.6  NEUTROABS 11.5*  --   --  9.2* 6.6 5.1  HGB 13.7 12.4 11.8* 10.7* 10.3* 10.4*  HCT 43.9 41.4 38.8 35.5* 34.9* 33.8*  MCV 102.1* 104.8* 103.5* 104.4* 102.6* 102.7*  PLT 209 167 241 167 160 152   Basic Metabolic Panel:  Recent Labs Lab 01/27/17 1918 01/28/17 0523 01/29/17 0349 01/30/17 0338 01/31/17 0323 02/01/17 0516  NA 146* 146* 146* 149*  --  145  K 3.7 4.3 3.9 4.0  --  3.7  CL 102 106 111 113*  --  107  CO2 31 32 28 29  --  31  GLUCOSE 198* 189* 225* 251*  --  147*  BUN 48* 48* 58* 60*  --   34*  CREATININE 1.12* 1.07* 1.04* 0.98 1.00 0.82  CALCIUM 8.7* 8.2* 7.8* 8.0*  --  8.5*  MG  --  2.3 2.3 2.5* 2.4 2.4  PHOS  --  5.1* 3.9 2.8 3.7 3.2   GFR: Estimated Creatinine Clearance: 54.1 mL/min (by C-G formula based on SCr of 0.82 mg/dL). Liver Function Tests:  Recent Labs Lab 01/27/17 1918 01/28/17 0523  AST 41 31  ALT 19 17  ALKPHOS 43 39  BILITOT 0.6 0.4  PROT 7.3 6.7  ALBUMIN 3.8 3.2*   No results for input(s): LIPASE,  AMYLASE in the last 168 hours. No results for input(s): AMMONIA in the last 168 hours. Coagulation Profile: No results for input(s): INR, PROTIME in the last 168 hours. Cardiac Enzymes:  Recent Labs Lab 01/27/17 1918 01/27/17 2143 01/28/17 0523 01/28/17 1114 01/29/17 0349 01/30/17 0338 01/31/17 0323 02/01/17 0516  CKTOTAL 1,612*  --  913*  --  284* 430* 330* 143  TROPONINI 0.09* 0.07* 0.08* 0.05*  --  <0.03  --   --    BNP (last 3 results) No results for input(s): PROBNP in the last 8760 hours. HbA1C: No results for input(s): HGBA1C in the last 72 hours. CBG:  Recent Labs Lab 01/31/17 2035 02/01/17 0014 02/01/17 0351 02/01/17 0728 02/01/17 1212  GLUCAP 106* 127* 139* 144* 210*   Lipid Profile: No results for input(s): CHOL, HDL, LDLCALC, TRIG, CHOLHDL, LDLDIRECT in the last 72 hours. Thyroid Function Tests: No results for input(s): TSH, T4TOTAL, FREET4, T3FREE, THYROIDAB in the last 72 hours. Anemia Panel:  Recent Labs  01/31/17 1123  VITAMINB12 98*  FOLATE 13.4  FERRITIN 60  TIBC 273  IRON 31  RETICCTPCT 2.5   Sepsis Labs:  Recent Labs Lab 01/27/17 1918 01/30/17 0338  LATICACIDVEN 1.7 0.9    Recent Results (from the past 240 hour(s))  Urine culture     Status: None   Collection Time: 01/27/17  6:02 PM  Result Value Ref Range Status   Specimen Description URINE, RANDOM  Final   Special Requests NONE  Final   Culture   Final    NO GROWTH Performed at [facility], [address] [place],  [place] [id number]    Report Status 01/29/2017 FINAL  Final  MRSA PCR Screening     Status: None   Collection Time: 01/28/17  6:52 AM  Result Value Ref Range Status   MRSA by PCR NEGATIVE NEGATIVE Final    Comment:        The GeneXpert MRSA Assay (FDA approved for NASAL specimens only), is one component of a comprehensive MRSA colonization surveillance program. It is not intended to diagnose MRSA infection nor to guide or monitor treatment for MRSA infections.   Respiratory Panel by PCR     Status: None   Collection Time: 01/28/17 11:02 AM  Result Value Ref Range Status   Adenovirus NOT DETECTED NOT DETECTED Final   Coronavirus 229E NOT DETECTED NOT DETECTED Final   Coronavirus HKU1 NOT DETECTED NOT DETECTED Final   Coronavirus NL63 NOT DETECTED NOT DETECTED Final   Coronavirus OC43 NOT DETECTED NOT DETECTED Final   Metapneumovirus NOT DETECTED NOT DETECTED Final   Rhinovirus / Enterovirus NOT DETECTED NOT DETECTED Final   Influenza A NOT DETECTED NOT DETECTED Final   Influenza B NOT DETECTED NOT DETECTED Final   Parainfluenza Virus 1 NOT DETECTED NOT DETECTED Final   Parainfluenza Virus 2 NOT DETECTED NOT DETECTED Final   Parainfluenza Virus 3 NOT DETECTED NOT DETECTED Final   Parainfluenza Virus 4 NOT DETECTED NOT DETECTED Final   Respiratory Syncytial Virus NOT DETECTED NOT DETECTED Final   Bordetella pertussis NOT DETECTED NOT DETECTED Final   Chlamydophila pneumoniae NOT DETECTED NOT DETECTED Final   Mycoplasma pneumoniae NOT DETECTED NOT DETECTED Final    Comment: Performed at [facility], [address] [place], [place] [address]  Culture, blood (Routine X 2) w Reflex to ID Panel     Status: None (Preliminary result)   Collection Time: 01/28/17 11:14 AM  Result Value Ref Range  Status   Specimen Description BLOOD LEFT HAND  Final   Special Requests IN PEDIATRIC BOTTLE Blood Culture adequate volume  Final   Culture   Final    NO GROWTH 4 DAYS Performed at [facility], [address], [place], [place] [address]    Report Status PENDING  Incomplete  Culture, blood (Routine X 2) w Reflex to ID Panel     Status: None (Preliminary result)   Collection Time: 01/28/17 11:14 AM  Result Value Ref Range Status   Specimen Description BLOOD RIGHT HAND  Final   Special Requests IN PEDIATRIC BOTTLE Blood Culture adequate volume  Final   Culture   Final    NO GROWTH 4 DAYS Performed at [facility], [address], [place], [place] [address]    Report Status PENDING  Incomplete     Radiology Studies: Dg Chest Port 1 View  Result Date: 02/01/2017 CLINICAL DATA:  Hypoxia EXAM: PORTABLE CHEST 1 VIEW COMPARISON:  01/30/2017 FINDINGS: There is no focal consolidation. There are small bilateral pleural effusions. There is no pneumothorax. The heart and mediastinal contours are unremarkable. The osseous structures are unremarkable. IMPRESSION: Small bilateral pleural effusions. Electronically Signed   By: Elige Ko   On: 02/01/2017 11:30   Ct Renal Stone Study  Result Date: 01/31/2017 CLINICAL DATA:  Hematuria. EXAM: CT ABDOMEN AND PELVIS WITHOUT CONTRAST TECHNIQUE: Multidetector CT imaging of the abdomen and pelvis was performed following the standard protocol without IV contrast. COMPARISON:  CT 09/05/2011 FINDINGS: Lower chest: Small to moderate bilateral pleural effusions with adjacent compressive atelectasis. Linear atelectasis in the lingula and right lower lobe. Heart is normal in size, there are coronary artery calcifications. Hepatobiliary: No focal liver abnormality is seen. No gallstones, gallbladder wall thickening, or biliary dilatation. Pancreas: No ductal dilatation or inflammation. Spleen: Normal in size without focal abnormality. Adrenals/Urinary Tract: Low-density 11 mm left adrenal nodule is unchanged from prior CT and likely adenoma. Mild bilateral adrenal thickening, stable. There is an 8 mm nonobstructing stone in the lower left kidney. 2 mm  calcification in the interpolar right kidney may be a nonobstructing stone or parenchymal calcification. No hydronephrosis. Parapelvic cysts in the left kidney as seen on prior exam. The ureters are decompressed. Urinary bladder is physiologically distended. Minimal layering hyperdensity in the bladder. Stomach/Bowel: Colonic diverticulosis involving the entire colon, most prominent distally. No diverticulitis or acute bowel inflammation. Normal appendix. Stomach distended with ingested contents. Vascular/Lymphatic: Aortic and branch atherosclerosis without aneurysm. No adenopathy. Reproductive: Soft tissue prominence in the region of the cervix. Uterus is otherwise quiescent. No adnexal mass. Other: Dependent body wall edema most prominent in the flanks. No abdominopelvic ascites. No free air. Musculoskeletal: Degenerative change in the spine with degenerative disc disease and facet arthropathy. There are no acute or suspicious osseous abnormalities. IMPRESSION: 1. Nonobstructing stone in the left kidney. Possible nonobstructing right nephrolithiasis versus small parenchymal calcification. No hydronephrosis or obstructive uropathy. 2. Dependent hyperdensity within the urinary bladder may be small stones or hyperdense debris, as seen with urinary tract infection. 3. Small to moderate bilateral pleural effusions. Body wall edema most prominent in the flanks. 4. Soft tissue prominence in the region of the cervix, of unknown significance. Recommend correlation with pelvic exam. 5. Incidental findings of aortic and branch atherosclerosis, as well as coronary artery calcifications. Colonic diverticulosis without acute inflammation. Electronically Signed   By: Rubye Oaks M.D.   On: 01/31/2017 21:08    Scheduled Meds: . amoxicillin-clavulanate  1 tablet Oral Q12H  . aspirin EC  81 mg Oral Daily  . furosemide  40 mg Intravenous BID  . insulin aspart  0-20 Units Subcutaneous Q4H  . ipratropium-albuterol  3 mL  Nebulization BID  . mouth rinse  15 mL Mouth Rinse BID  . pantoprazole  40 mg Oral Daily  . sodium chloride flush  3 mL Intravenous Q12H  . vitamin B-12  1,000 mcg Oral Daily   Continuous Infusions: . sodium chloride    . sodium chloride       LOS: 4 days   Latisha Lasch, Scheryl Marten, MD [facility] Pager [phone]  If 7PM-7AM, please contact night-coverage [email or web address] Password [email or web address] 02/01/2017, 2:27 PM

## 2017-02-01 NOTE — Progress Notes (Signed)
Resumed care for pt. Agree with previous RN's assessment. Will continue to monitor

## 2017-02-01 NOTE — Clinical Social Work Note (Signed)
Clinical Social Work Assessment  Patient Details  Name: Beth Kerr MRN: [id number] Date of Birth: Feb 08, 1941  Date of referral:  02/01/17               Reason for consult:  Facility Placement                Permission sought to share information with:  Oceanographer[facility]ility Contact Representative Permission granted to share information::  Yes, Verbal Permission Granted  Name::        Agency::     Relationship::     Contact Information:     Housing/Transportation Living arrangements for the past 2 months:  Single Family Home Source of Information:  Patient Patient Interpreter Needed:  None Criminal Activity/Legal Involvement Pertinent to Current Situation/Hospitalization:  No - Comment as needed Significant Relationships:  Other Family Members (Grandson) Lives with:  Self Do you feel safe going back to the place where you live?   (PT recommending SNF) Need for family participation in patient care:  No (Coment)  Care giving concerns:  PT recommending SNF. Patient resides alone and experienced fall where patient was found lying on the floor after several days. Patient reports that she was walking freely without any DME prior to fall and is now having issues with ambulating and generalized weakness.    Social Worker assessment / plan:  CSW spoke with patient at bedside, patient alert and oriented x 4. Patient's friend present. Patient reports that she resides alone and has been independent prior to fall/hospitlaization. Patient reports that she has a grandson and niece that reside locally who are a part of her support system. CSW and patient discussed [facility] Patient reported that she has not been to a SNF in the past and that she is agreeable. Patient reports that she has a friend that is currently in rehab and that she would like to be placed where her friend. Patient reported that she would contact her friend and try to find out which SNF her friend was at. CSW will  start insurance authorization. CSW completed FL2 and will assist patient with discharge planning to SNF for ST rehab.  Employment status:  [employer] Insurance information:  Managed Medicare PT Recommendations:  Skilled Nursing Facility Information / Referral to community resources:  Skilled Nursing Facility  Patient/Family's Response to care:  Patient agreeable to SNF.  Patient/Family's Understanding of and Emotional Response to Diagnosis, Current Treatment, and Prognosis:  Patient presented calm when speaking with CSW. Patient verbalized understanding of plan to discharge to SNF and appeared to be excited about possibly being at the same facility as her friend. Patient reported her goal is to return home after ST rehab because she has stuff to do this summer, patient eager to recover and regain independence.   Emotional Assessment Appearance:  Appears stated age Attitude/Demeanor/Rapport:  Other (Cooperative) Affect (typically observed):  Pleasant Orientation:  Oriented to Self, Oriented to Place, Oriented to  Time, Oriented to Situation Alcohol / Substance use:  Not Applicable Psych involvement (Current and /or in the community):  No (Comment)  Discharge Needs  Concerns to be addressed:  No discharge needs identified Readmission within the last 30 days:  No Current discharge risk:  None Barriers to Discharge:  No Barriers Identified   Antionette PolesKimberly L Tarique Loveall, LCSW 02/01/2017, 11:36 AM

## 2017-02-01 NOTE — Evaluation (Addendum)
Occupational Therapy Evaluation Patient Details Name: Beth LaudBetty H Polivka MRN: [id number] DOB: 1941/07/09 Today's Date: 02/01/2017    History of Present Illness 76 year old with COPD admitted after being found down on the floor with mild rhabdomyolysis.  Per review of chart, pt was down for 4 days at home.  After admission, developed hypercarbic respiratory failure requiring mechanical ventilation. She was extubated 6/4 and has done well since then per CCS note   Clinical Impression   Pt was admitted for the above.  She currently needs min A to stand and up to max A for LB adls.  She is very motivated and will benefit from continued OT in acute setting. Goals are for min guard to min A.      Follow Up Recommendations  SNF    Equipment Recommendations  3 in 1 bedside commode    Recommendations for Other Services       Precautions / Restrictions Precautions Precautions: Fall Precaution Comments: monitor sats Restrictions Weight Bearing Restrictions: No      Mobility Bed Mobility   Bed Mobility: Sit to Supine     Supine to sit: HOB elevated;Min assist Sit to supine: Mod assist;HOB elevated   General bed mobility comments: assist for trunk sitting up and assist for legs getting back to bed.  Pt used bedrail  Transfers Overall transfer level: Needs assistance Equipment used: Rolling walker (2 wheeled) Transfers: Sit to/from Stand Sit to Stand: Min assist         General transfer comment: assist to rise and stabilize. [facility]dieste[facility] 2 steps to [facility]    Balance Overall balance assessment: Needs assistance   Sitting balance-Leahy Scale: Good     Standing balance support: Single extremity supported Standing balance-Leahy Scale: Poor Standing balance comment: relies on single UE support                           ADL either performed or assessed with clinical judgement   ADL Overall ADL's : Needs assistance/impaired Eating/Feeding: Independent   Grooming: Set  up;Sitting   Upper Body Bathing: Set up;Sitting   Lower Body Bathing: Moderate assistance;Sit to/from stand   Upper Body Dressing : Minimal assistance;Sitting   Lower Body Dressing: Maximal assistance;Sit to/from stand                 General ADL Comments: Spoke to Dr Rhona Leavenshiu.  OT had asked for reorder after pt got off ventilator.  Verbal order received.    Pt doing well with PT, so initiated OT evaluation at this time.  Educated on AE, but did not use this session. Pt usually does not wear 02, and she normally leans forward for adls.  Pt very motivated.  Only side stepped [facility]     Vision         Perception     Praxis      Pertinent Vitals/Pain Pain Assessment: Faces Pain Score: 4  Pain Location: ribs when back to bed Pain Descriptors / Indicators: Aching Pain Intervention(s): Limited activity within patient's tolerance;Monitored during session;Repositioned     Hand Dominance     Extremity/Trunk Assessment Upper Extremity Assessment Upper Extremity Assessment: Generalized weakness;LUE deficits/detail LUE Deficits / Details: L shoulder tight at end range (approximately 90).  Pt elevates scapula with arm           Communication     Cognition Arousal/Alertness: Awake/alert Behavior During Therapy: WFL for tasks assessed/performed Overall Cognitive Status: Within  Functional Limits for tasks assessed                                     General Comments       Exercises    Shoulder Instructions      Home Living Family/patient expects to be discharged to:: Skilled nursing facility Living Arrangements: Alone                                      Prior Functioning/Environment Level of Independence: Independent                 OT Problem List: Decreased strength;Decreased activity tolerance;Impaired balance (sitting and/or standing);Decreased knowledge of use of DME or AE;Cardiopulmonary status  limiting activity;Pain      OT Treatment/Interventions: Self-care/ADL training;Energy conservation;DME and/or AE instruction;Therapeutic activities;Patient/family education;Balance training    OT Goals(Current goals can be found in the care plan section) Acute Rehab OT Goals Patient Stated Goal: to do normal things OT Goal Formulation: With patient Time For Goal Achievement: 02/08/17 Potential to Achieve Goals: Good ADL Goals Pt Will Perform Lower Body Bathing: with min guard assist;with adaptive equipment;sit to/from stand Pt Will Perform Lower Body Dressing: with min assist;sit to/from stand Pt Will Transfer to Toilet: with min assist;stand pivot transfer;bedside commode Pt Will Perform Toileting - Clothing Manipulation and hygiene: with min guard assist;sit to/from stand  OT Frequency: Min 2X/week   Barriers to D/C:            Co-evaluation              AM-PAC PT "6 Clicks" Daily Activity     Outcome Measure Help from another person eating meals?: None Help from another person taking care of personal grooming?: A Little Help from another person toileting, which includes using toliet, bedpan, or urinal?: A Lot Help from another person bathing (including washing, rinsing, drying)?: A Lot Help from another person to put on and taking off regular upper body clothing?: A Little Help from another person to put on and taking off regular lower body clothing?: A Lot 6 Click Score: 16   End of Session    Activity Tolerance: Patient tolerated treatment well Patient left: with call bell/phone within reach;in bed;with bed alarm set  OT Visit Diagnosis: Muscle weakness (generalized) (M62.81)                Time: 9604-5409 OT Time Calculation (min): 18 min Charges:  OT General Charges $OT Visit: 1 Procedure OT Evaluation $OT Eval Moderate Complexity: 1 Procedure G-Codes:     [place], OTR/L [phone] 02/01/2017  [address] 02/01/2017, 2:41 PM

## 2017-02-01 NOTE — Care Management Important Message (Signed)
Important Message  Patient Details  Name: Beth Kerr MRN: [id number] Date of Birth: November 06, 1940   Medicare Important Message Given:  Yes    Caren MacadamFuller, Arrian Manson 02/01/2017, 11:20 AMImportant Message  Patient Details  Name: Beth Kerr MRN: [id number] Date of Birth: November 06, 1940   Medicare Important Message Given:  Yes    Caren MacadamFuller, Mateen Franssen 02/01/2017, 11:20 AM

## 2017-02-01 NOTE — Consult Note (Signed)
WOC Nurse wound consult note Reason for Consult: tear proximal to anus Wound type: healed slit at 12 o'clock area, has two small skin tag like areas in area.  Pt states did not know she had this and has no pain associated when I palpated area. 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 No problem with pain, drainage,or  bowel movements. We will not follow, but will remain available to this patient, to nursing, and the medical and/or surgical teams.  Please re-consult if we need to assist further.    Barnett HatterMelinda Sakia Schrimpf, RN-C, WTA-C Wound Treatment Associate

## 2017-02-02 ENCOUNTER — Inpatient Hospital Stay (HOSPITAL_COMMUNITY): Payer: Medicare Other

## 2017-02-02 LAB — CBC WITH DIFFERENTIAL/PLATELET
BASOS ABS: 0 10*3/uL (ref 0.0–0.1)
BASOS PCT: 0 %
EOS PCT: 4 %
Eosinophils Absolute: 0.3 10*3/uL (ref 0.0–0.7)
HCT: 38 % (ref 36.0–46.0)
Hemoglobin: 11.6 g/dL — ABNORMAL LOW (ref 12.0–15.0)
Lymphocytes Relative: 7 %
Lymphs Abs: 0.5 10*3/uL — ABNORMAL LOW (ref 0.7–4.0)
MCH: 31 pg (ref 26.0–34.0)
MCHC: 30.5 g/dL (ref 30.0–36.0)
MCV: 101.6 fL — ABNORMAL HIGH (ref 78.0–100.0)
MONO ABS: 0.7 10*3/uL (ref 0.1–1.0)
Monocytes Relative: 10 %
Neutro Abs: 5.6 10*3/uL (ref 1.7–7.7)
Neutrophils Relative %: 79 %
PLATELETS: 169 10*3/uL (ref 150–400)
RBC: 3.74 MIL/uL — AB (ref 3.87–5.11)
RDW: 14.8 % (ref 11.5–15.5)
WBC: 7.1 10*3/uL (ref 4.0–10.5)

## 2017-02-02 LAB — CULTURE, BLOOD (ROUTINE X 2)
CULTURE: NO GROWTH
CULTURE: NO GROWTH
Special Requests: ADEQUATE
Special Requests: ADEQUATE

## 2017-02-02 LAB — GLUCOSE, CAPILLARY
GLUCOSE-CAPILLARY: 133 mg/dL — AB (ref 65–99)
GLUCOSE-CAPILLARY: 156 mg/dL — AB (ref 65–99)
GLUCOSE-CAPILLARY: 211 mg/dL — AB (ref 65–99)
Glucose-Capillary: 111 mg/dL — ABNORMAL HIGH (ref 65–99)
Glucose-Capillary: 132 mg/dL — ABNORMAL HIGH (ref 65–99)
Glucose-Capillary: 138 mg/dL — ABNORMAL HIGH (ref 65–99)
Glucose-Capillary: 176 mg/dL — ABNORMAL HIGH (ref 65–99)

## 2017-02-02 LAB — BASIC METABOLIC PANEL
Anion gap: 9 (ref 5–15)
BUN: 26 mg/dL — AB (ref 6–20)
CALCIUM: 8.9 mg/dL (ref 8.9–10.3)
CHLORIDE: 100 mmol/L — AB (ref 101–111)
CO2: 37 mmol/L — ABNORMAL HIGH (ref 22–32)
CREATININE: 0.77 mg/dL (ref 0.44–1.00)
Glucose, Bld: 132 mg/dL — ABNORMAL HIGH (ref 65–99)
Potassium: 3.4 mmol/L — ABNORMAL LOW (ref 3.5–5.1)
SODIUM: 146 mmol/L — AB (ref 135–145)

## 2017-02-02 LAB — PHOSPHORUS: PHOSPHORUS: 4.9 mg/dL — AB (ref 2.5–4.6)

## 2017-02-02 LAB — MAGNESIUM: MAGNESIUM: 2.1 mg/dL (ref 1.7–2.4)

## 2017-02-02 LAB — CK: CK TOTAL: 79 U/L (ref 38–234)

## 2017-02-02 MED ORDER — FUROSEMIDE 40 MG PO TABS
40.0000 mg | ORAL_TABLET | Freq: Every day | ORAL | Status: DC
  Administered 2017-02-02 – 2017-02-03 (×2): 40 mg via ORAL
  Filled 2017-02-02 (×2): qty 1

## 2017-02-02 MED ORDER — FUROSEMIDE 10 MG/ML IJ SOLN: 40.0000 mg | Freq: Every day | INTRAMUSCULAR | Status: DC

## 2017-02-02 MED ORDER — IPRATROPIUM-ALBUTEROL 0.5-2.5 (3) MG/3ML IN SOLN
3.0000 mL | Freq: Four times a day (QID) | RESPIRATORY_TRACT | Status: DC
  Administered 2017-02-02 (×2): 3 mL via RESPIRATORY_TRACT
  Filled 2017-02-02 (×2): qty 3

## 2017-02-02 MED ORDER — POTASSIUM CHLORIDE CRYS ER 20 MEQ PO TBCR
40.0000 meq | EXTENDED_RELEASE_TABLET | Freq: Once | ORAL | Status: AC
  Administered 2017-02-02: 40 meq via ORAL
  Filled 2017-02-02: qty 2

## 2017-02-02 NOTE — Progress Notes (Signed)
PROGRESS NOTE    Beth Kerr  ZOX:[id number] DOB: 1941-08-27 DOA: 01/27/2017 PCP: [name], MD    Brief Narrative:  Presented with a fall 4 nights ago she was unable to get up since her fall and spent 4 days in the bathroom,she was found to have hypoxia, on day two of admission, she is lethargic, abg showed hypercapnea, ph 7.15, she failed bipap , had to be intubated and transferred to icu, she is extubated on 6/4, transferred to hospitalist service on 6/5  Assessment & Plan:   Principal Problem:   Acute hypercapnic respiratory failure (HCC) Active Problems:   COPD exacerbation (HCC)   Rhabdomyolysis   Elevated troponin   DM (diabetes mellitus), type 2 with renal complications (HCC)   Syncope and collapse   Global amnesia   Elevated CK   Encephalopathy acute   Shock circulatory (HCC)  Acute hypoxic and hypercarbic  respiratory failure.  Etiology unclear. Likely  COPD exacerbation. ?Aspiration PNA Patient required intubation, extubated on 6/4 On augmentin (day 5/8)/nebs, off steroids now Wean o2, speech eval CXR ordered and personally reviewed. Patient with bilateral effusions. Also reviewed recent CT with evidence of tissue swelling -Labs reviewed. Serum bicarb up to 37, thus will transition to PO lasix daily -Decreased BS on exam. Will increase neb tx from BID to q6hrs. 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 Pt with findings of bilateral non-obstructing renal stones with no hydronephrosis -Patient is asymptomatic, denies flank pain -Plan for continued supportive care for now  noninsulin dependent diabetes, a1c 7, home oral meds held, on ssi here -Remains stable at this time  HTN; home meds norvasc, volsartan /hctz on hold thus far -Vital signs reviewed. bp remains stable thus far  Falls at home, PT consulted, consideration for skilled nursing level of care at time of discharge -Pt and family agreeable to SNF  DVT prophylaxis: SCDs Code Status: Partial Family Communication: Patient in room, family at bedside Disposition Plan: Skilled nursing facility, timing uncertain  Consultants:   Pulmonary critical care  Procedures:     Antimicrobials: Anti-infectives    Start     Dose/Rate Route Frequency Ordered Stop   01/31/17 1030  amoxicillin-clavulanate (AUGMENTIN) 875-125 MG per tablet 1 tablet     1 tablet Oral Every 12 hours 01/31/17 1008     01/29/17 1000  vancomycin (VANCOCIN) 500 mg in sodium chloride 0.9 % 100 mL IVPB  Status:  Discontinued     500 mg 100 mL/hr over 60 Minutes Intravenous Every 12 hours 01/28/17 1023 01/30/17 1005   01/29/17 0600  azithromycin (ZITHROMAX) 500 mg in dextrose 5 % 250 mL IVPB  Status:  Discontinued     500 mg 250 mL/hr over 60 Minutes Intravenous Every 24 hours 01/28/17 1217 01/31/17 1008   01/28/17 1200  piperacillin-tazobactam (ZOSYN) IVPB 3.375 g  Status:  Discontinued     3.375 g 12.5 mL/hr over 240 Minutes Intravenous Every 8 hours 01/28/17 1023 01/31/17  1008   01/28/17 1100  vancomycin (VANCOCIN) IVPB 1000 mg/200 mL premix     1,000 mg 200 mL/hr over 60 Minutes Intravenous  Once 01/28/17 1023 01/28/17 1328   01/28/17 0600  azithromycin (ZITHROMAX) 500 mg in dextrose 5 % 250 mL IVPB     500 mg 250 mL/hr over 60 Minutes Intravenous  Once 01/28/17 0556 01/28/17 0800      Subjective: Reports feeling better after  breathing treatment  Objective: Vitals:   02/02/17 1148 02/02/17 1149 02/02/17 1344 02/02/17 1457  BP:   135/77   Pulse:   82   Resp:   18   Temp:   98.7 F (37.1 C)   TempSrc:   Oral   SpO2: (!) 87% 93% 94% 90%  Weight:      Height:        Intake/Output Summary (Last 24 hours) at 02/02/17 1556 Last data filed at 02/02/17 0000  Gross per 24 hour  Intake                0 ml  Output              600 ml  Net             -600 ml   Filed Weights   01/31/17 0335 02/01/17 0358 02/02/17 0415  Weight: 77.8 kg (171 lb 8.3 oz) 74.1 kg (163 lb 5.8 oz) 72.2 kg (159 lb 2.8 oz)    Examination: General exam: Awake, in nad, laying in bed Respiratory system: decreased bs throughout, no audible wheezing Cardiovascular system: regular rate, s1, s2 Gastrointestinal system: Soft, nondistended, positive BS Central nervous system: CN2-12 grossly intact, strength intact Extremities: Perfused, no clubbing Skin: Normal skin turgor, no notable skin lesions seen Psychiatry: Mood normal // no visual hallucinations    Data Reviewed: I have personally reviewed following labs and imaging studies  CBC:  Recent Labs Lab 01/27/17 1918  01/29/17 0349 01/30/17 0338 01/31/17 0323 02/01/17 0516 02/02/17 0532  WBC 13.0*  < > 14.7* 9.9 7.7 6.6 7.1  NEUTROABS 11.5*  --   --  9.2* 6.6 5.1 5.6  HGB 13.7  < > 11.8* 10.7* 10.3* 10.4* 11.6*  HCT 43.9  < > 38.8 35.5* 34.9* 33.8* 38.0  MCV 102.1*  < > 103.5* 104.4* 102.6* 102.7* 101.6*  PLT 209  < > 241 167 160 152 169  < > = values in this interval not displayed. Basic Metabolic Panel:  Recent Labs Lab 01/28/17 0523 01/29/17 0349 01/30/17 0338 01/31/17 0323 02/01/17 0516 02/02/17 0532  NA 146* 146* 149*  --  145 146*  K 4.3 3.9 4.0  --  3.7 3.4*  CL 106 111 113*  --  107 100*  CO2 32 28 29  --  31 37*  GLUCOSE 189* 225* 251*  --  147* 132*  BUN 48* 58* 60*  --  34* 26*  CREATININE 1.07* 1.04* 0.98 1.00 0.82 0.77  CALCIUM 8.2* 7.8* 8.0*  --   8.5* 8.9  MG 2.3 2.3 2.5* 2.4 2.4 2.1  PHOS 5.1* 3.9 2.8 3.7 3.2 4.9*   GFR: Estimated Creatinine Clearance: 54.8 mL/min (by C-G formula based on SCr of 0.77 mg/dL). Liver Function Tests:  Recent Labs Lab 01/27/17 1918 01/28/17 0523  AST 41 31  ALT 19 17  ALKPHOS 43 39  BILITOT 0.6 0.4  PROT 7.3 6.7  ALBUMIN 3.8 3.2*   No results for input(s): LIPASE, AMYLASE in the last 168 hours. No  results for input(s): AMMONIA in the last 168 hours. Coagulation Profile: No results for input(s): INR, PROTIME in the last 168 hours. Cardiac Enzymes:  Recent Labs Lab 01/27/17 1918 01/27/17 2143 01/28/17 0523 01/28/17 1114 01/29/17 0349 01/30/17 0338 01/31/17 0323 02/01/17 0516 02/02/17 0532  CKTOTAL 1,612*  --  913*  --  284* 430* 330* 143 79  TROPONINI 0.09* 0.07* 0.08* 0.05*  --  <0.03  --   --   --    BNP (last 3 results) No results for input(s): PROBNP in the last 8760 hours. HbA1C: No results for input(s): HGBA1C in the last 72 hours. CBG:  Recent Labs Lab 02/01/17 2005 02/01/17 2357 02/02/17 0415 02/02/17 0749 02/02/17 1131  GLUCAP 172* 156* 133* 111* 211*   Lipid Profile: No results for input(s): CHOL, HDL, LDLCALC, TRIG, CHOLHDL, LDLDIRECT in the last 72 hours. Thyroid Function Tests: No results for input(s): TSH, T4TOTAL, FREET4, T3FREE, THYROIDAB in the last 72 hours. Anemia Panel:  Recent Labs  01/31/17 1123  VITAMINB12 98*  FOLATE 13.4  FERRITIN 60  TIBC 273  IRON 31  RETICCTPCT 2.5   Sepsis Labs:  Recent Labs Lab 01/27/17 1918 01/30/17 0338  LATICACIDVEN 1.7 0.9    Recent Results (from the past 240 hour(s))  Urine culture     Status: None   Collection Time: 01/27/17  6:02 PM  Result Value Ref Range Status   Specimen Description URINE, RANDOM  Final   Special Requests NONE  Final   Culture   Final    NO GROWTH Performed at [facility], [address] [place], [place] [address]    Report Status 01/29/2017 FINAL  Final  MRSA  PCR Screening     Status: None   Collection Time: 01/28/17  6:52 AM  Result Value Ref Range Status   MRSA by PCR NEGATIVE NEGATIVE Final    Comment:        The GeneXpert MRSA Assay (FDA approved for NASAL specimens only), is one component of a comprehensive MRSA colonization surveillance program. It is not intended to diagnose MRSA infection nor to guide or monitor treatment for MRSA infections.   Respiratory Panel by PCR     Status: None   Collection Time: 01/28/17 11:02 AM  Result Value Ref Range Status   Adenovirus NOT DETECTED NOT DETECTED Final   Coronavirus 229E NOT DETECTED NOT DETECTED Final   Coronavirus HKU1 NOT DETECTED NOT DETECTED Final   Coronavirus NL63 NOT DETECTED NOT DETECTED Final   Coronavirus OC43 NOT DETECTED NOT DETECTED Final   Metapneumovirus NOT DETECTED NOT DETECTED Final   Rhinovirus / Enterovirus NOT DETECTED NOT DETECTED Final   Influenza A NOT DETECTED NOT DETECTED Final   Influenza B NOT DETECTED NOT DETECTED Final   Parainfluenza Virus 1 NOT DETECTED NOT DETECTED Final   Parainfluenza Virus 2 NOT DETECTED NOT DETECTED Final   Parainfluenza Virus 3 NOT DETECTED NOT DETECTED Final   Parainfluenza Virus 4 NOT DETECTED NOT DETECTED Final   Respiratory Syncytial Virus NOT DETECTED NOT DETECTED Final   Bordetella pertussis NOT DETECTED NOT DETECTED Final   Chlamydophila pneumoniae NOT DETECTED NOT DETECTED Final   Mycoplasma pneumoniae NOT DETECTED NOT DETECTED Final    Comment: Performed at [facility], [address] [place], [place] [address]  Culture, blood (Routine X 2) w Reflex to ID Panel     Status: None   Collection Time: 01/28/17 11:14 AM  Result Value Ref Range Status  Specimen Description BLOOD LEFT HAND  Final   Special Requests IN PEDIATRIC BOTTLE Blood Culture adequate volume  Final   Culture   Final    NO GROWTH 5 DAYS Performed at [facility], [address] [place], [place] [address]    Report Status 02/02/2017  FINAL  Final  Culture, blood (Routine X 2) w Reflex to ID Panel     Status: None   Collection Time: 01/28/17 11:14 AM  Result Value Ref Range Status   Specimen Description BLOOD RIGHT HAND  Final   Special Requests IN PEDIATRIC BOTTLE Blood Culture adequate volume  Final   Culture   Final    NO GROWTH 5 DAYS Performed at [facility], [address] [place], [place] [address]    Report Status 02/02/2017 FINAL  Final     Radiology Studies: Dg Ribs Bilateral W/chest  Result Date: 02/02/2017 CLINICAL DATA:  Larey SeatFell 9 days ago, BILATERAL lower anterior rib pain, history hypertension, diabetes mellitus, COPD EXAM: BILATERAL RIBS AND CHEST - 4+ VIEW COMPARISON:  Earlier 02/01/2017 portable chest radiograph FINDINGS: Borderline enlargement of cardiac silhouette with pulmonary vascular congestion. Mediastinal contours normal. Emphysematous and bronchitic changes. LEFT pleural effusion and basilar atelectasis. Slight accentuation of perihilar markings. No acute infiltrate or pneumothorax. BB placed at site of symptoms lower lateral LEFT chest and lower lateral RIGHT chest. Diffuse osseous demineralization. No definite rib fracture or bone destruction. IMPRESSION: No acute osseous abnormalities. Emphysematous changes with LEFT basilar atelectasis and pleural effusion. Borderline enlargement of cardiac silhouette with pulmonary vascular congestion. Electronically Signed   By: Ulyses SouthwardMark  Boles M.D.   On: 02/02/2017 11:32   Dg Chest Port 1 View  Result Date: 02/01/2017 CLINICAL DATA:  Hypoxia EXAM: PORTABLE CHEST 1 VIEW COMPARISON:  01/30/2017 FINDINGS: There is no focal consolidation. There are small bilateral pleural effusions. There is no pneumothorax. The heart and mediastinal contours are unremarkable. The osseous structures are unremarkable. IMPRESSION: Small bilateral pleural effusions. Electronically Signed   By: Elige KoHetal  Patel   On: 02/01/2017 11:30   Ct Renal Stone Study  Result Date:  01/31/2017 CLINICAL DATA:  Hematuria. EXAM: CT ABDOMEN AND PELVIS WITHOUT CONTRAST TECHNIQUE: Multidetector CT imaging of the abdomen and pelvis was performed following the standard protocol without IV contrast. COMPARISON:  CT 09/05/2011 FINDINGS: Lower chest: Small to moderate bilateral pleural effusions with adjacent compressive atelectasis. Linear atelectasis in the lingula and right lower lobe. Heart is normal in size, there are coronary artery calcifications. Hepatobiliary: No focal liver abnormality is seen. No gallstones, gallbladder wall thickening, or biliary dilatation. Pancreas: No ductal dilatation or inflammation. Spleen: Normal in size without focal abnormality. Adrenals/Urinary Tract: Low-density 11 mm left adrenal nodule is unchanged from prior CT and likely adenoma. Mild bilateral adrenal thickening, stable. There is an 8 mm nonobstructing stone in the lower left kidney. 2 mm calcification in the interpolar right kidney may be a nonobstructing stone or parenchymal calcification. No hydronephrosis. Parapelvic cysts in the left kidney as seen on prior exam. The ureters are decompressed. Urinary bladder is physiologically distended. Minimal layering hyperdensity in the bladder. Stomach/Bowel: Colonic diverticulosis involving the entire colon, most prominent distally. No diverticulitis or acute bowel inflammation. Normal appendix. Stomach distended with ingested contents. Vascular/Lymphatic: Aortic and branch atherosclerosis without aneurysm. No adenopathy. Reproductive: Soft tissue prominence in the region of the cervix. Uterus is otherwise quiescent. No adnexal mass. Other: Dependent body wall edema most prominent in the flanks. No abdominopelvic ascites. No free air. Musculoskeletal:  Degenerative change in the spine with degenerative disc disease and facet arthropathy. There are no acute or suspicious osseous abnormalities. IMPRESSION: 1. Nonobstructing stone in the left kidney. Possible  nonobstructing right nephrolithiasis versus small parenchymal calcification. No hydronephrosis or obstructive uropathy. 2. Dependent hyperdensity within the urinary bladder may be small stones or hyperdense debris, as seen with urinary tract infection. 3. Small to moderate bilateral pleural effusions. Body wall edema most prominent in the flanks. 4. Soft tissue prominence in the region of the cervix, of unknown significance. Recommend correlation with pelvic exam. 5. Incidental findings of aortic and branch atherosclerosis, as well as coronary artery calcifications. Colonic diverticulosis without acute inflammation. Electronically Signed   By: Rubye Oaks M.D.   On: 01/31/2017 21:08    Scheduled Meds: . amoxicillin-clavulanate  1 tablet Oral Q12H  . aspirin EC  81 mg Oral Daily  . furosemide  40 mg Oral Daily  . insulin aspart  0-20 Units Subcutaneous Q4H  . ipratropium-albuterol  3 mL Nebulization Q6H  . mouth rinse  15 mL Mouth Rinse BID  . pantoprazole  40 mg Oral Daily  . sodium chloride flush  3 mL Intravenous Q12H  . vitamin B-12  1,000 mcg Oral Daily   Continuous Infusions: . sodium chloride    . sodium chloride       LOS: 5 days   Stephaniemarie Stoffel, Scheryl Marten, MD [facility] Pager [phone]  If 7PM-7AM, please contact night-coverage [email or web address] Password [email or web address] 02/02/2017, 3:56 PM

## 2017-02-02 NOTE — Progress Notes (Signed)
Nutrition Follow-up  DOCUMENTATION CODES:   Non-severe (moderate) malnutrition in context of acute illness/injury  INTERVENTION:  - Continue to encourage PO intakes of meals and beverages. - RD will continue to monitor for additional nutrition-related needs.  NUTRITION DIAGNOSIS:   Increased nutrient needs related to acute illness as evidenced by estimated needs. -revised.   GOAL:   Patient will meet greater than or equal to 90% of their needs -met on average.   MONITOR:   PO intake, Weight trends, Labs, Skin  ASSESSMENT:   76 year old female with past medical history of diabetes hypertension and COPD who presented initially yesterday after having a fall 4 nights ago at home. She spent 4 days in the bathroom. When the coworker did not find her at work, called for help and patient was found down in the bathroom. She has been in the ICU during her stay she has gotten progressively more hypoxic and hypercapnic failing BiPAP so required intubation. Pmh xof DM, COPD, lipid and bp  6/7 Diet advanced from NPO to FLD at 1300 on 6/4 and then to Carb Modified at 1250 on 6/5. Pt has been consuming mainly 100% of meals since diet advancement on 6/5. Lasix was to be increased to BID yesterday per MD note (not reflected in orders); weight trending down toward admission weight. Current weight is +1.7 kg from admission weight. Per notes, plan for d/c to SNF when medically stable.   Medications reviewed; 40 mg oral Lasix/day, sliding scale Novolog, 40 mg oral Protonix/day, 40 mEq oral KCl x1 dose today, 100 mcg oral vitamin B12/day.  Labs reviewed; CBGs: 111, 133, and 211 mg/dL today, Na: 146 mmol/L, K: 3.4 mmol/L, Cl: 100 mmol/L, BUN: 26 mg/dL, Phos: 4.9 mg/dL, HgbA1c: 7%.    6/4 - Pt was extubated to [place] ~10 minutes ago.  - TF was stopped prior to extubation.  - Estimated nutrition needs updated at this time and based on sepsis.  - Weight from yesterday (70.9 kg) used in estimating needs as  weight is +2.1 kg over night.    6/3 - Patient in room with grandson at bedside.  - Pt awake and attempted to answer RD's questions. - Pt states via writing on paper, she last ate on Monday 5/28 when she returned from a beach trip.  - Per pt's grandson, pt usually eats oatmeal for breakfast and dinner and snacks throughout the day on fruits. - Pt did not eat anything for 4 days PTA d/t falling and spending those days in the bathroom.  - Nutrition-Focused physical exam completed. Findings are mildfat depletion, nomuscle depletion, and noedema.   Patient is currently intubated on ventilator support MV: 8.9L/min Temp (24hrs), Avg:97.6 F (36.4 C), Min:97 F (36.1 C), Max:97.9 F (36.6 C)    Diet Order:  Diet Carb Modified Fluid consistency: Thin; Room service appropriate? Yes  Skin:  Wound (see comment) (non-pressure injury to anus)  Last BM:  6/7  Height:   Ht Readings from Last 1 Encounters:  01/28/17 '5\' 2"'$  (1.575 m)    Weight:   Wt Readings from Last 1 Encounters:  02/02/17 159 lb 2.8 oz (72.2 kg)    Ideal Body Weight:  50 kg  BMI:  Body mass index is 29.11 kg/m.  Estimated Nutritional Needs:   Kcal:  1770-1985 (25-28 kcal/kg)  Protein:  85-100 grams (1.2-1.4 grams/kg)  Fluid:  1.5L/day  EDUCATION NEEDS:   No education needs identified at this time    Jarome Matin, MS, RD, LDN, [facility]  Clinical Dietitian Pager # [phone] After hours/weekend pager # [phone]

## 2017-02-02 NOTE — Care Management Note (Signed)
Case Management Note  Patient Details  Name: Isaac LaudBetty H Mckeehan MRN: [id number] Date of Birth: March 07, 1941  Subjective/Objective:PT/OT-recc SNF. CSW already following for SNF, likely dc in am if stable.                    Action/Plan:d/c SNF.   Expected Discharge Date:   (unknown)               Expected Discharge Plan:  Skilled Nursing Facility  In-House Referral:  Clinical Social Work  Discharge planning Services  CM Consult  Post Acute Care Choice:    Choice offered to:     DME Arranged:    DME Agency:     HH Arranged:    HH Agency:     Status of Service:  In process, will continue to follow  If discussed at Long Length of Stay Meetings, dates discussed:    Additional Comments:  Lanier ClamMahabir, Isola Mehlman, RN 02/02/2017, 1:51 PM

## 2017-02-03 DIAGNOSIS — J9601 Acute respiratory failure with hypoxia: Secondary | ICD-10-CM | POA: Diagnosis not present

## 2017-02-03 DIAGNOSIS — R4182 Altered mental status, unspecified: Secondary | ICD-10-CM | POA: Diagnosis not present

## 2017-02-03 DIAGNOSIS — R278 Other lack of coordination: Secondary | ICD-10-CM | POA: Diagnosis not present

## 2017-02-03 DIAGNOSIS — M6281 Muscle weakness (generalized): Secondary | ICD-10-CM | POA: Diagnosis not present

## 2017-02-03 DIAGNOSIS — J441 Chronic obstructive pulmonary disease with (acute) exacerbation: Secondary | ICD-10-CM | POA: Diagnosis not present

## 2017-02-03 DIAGNOSIS — I5031 Acute diastolic (congestive) heart failure: Secondary | ICD-10-CM | POA: Diagnosis not present

## 2017-02-03 DIAGNOSIS — M6282 Rhabdomyolysis: Secondary | ICD-10-CM | POA: Diagnosis not present

## 2017-02-03 DIAGNOSIS — J9602 Acute respiratory failure with hypercapnia: Secondary | ICD-10-CM | POA: Diagnosis not present

## 2017-02-03 DIAGNOSIS — G479 Sleep disorder, unspecified: Secondary | ICD-10-CM | POA: Diagnosis not present

## 2017-02-03 DIAGNOSIS — J9690 Respiratory failure, unspecified, unspecified whether with hypoxia or hypercapnia: Secondary | ICD-10-CM | POA: Diagnosis not present

## 2017-02-03 DIAGNOSIS — E860 Dehydration: Secondary | ICD-10-CM | POA: Diagnosis not present

## 2017-02-03 DIAGNOSIS — M549 Dorsalgia, unspecified: Secondary | ICD-10-CM | POA: Diagnosis not present

## 2017-02-03 DIAGNOSIS — R41841 Cognitive communication deficit: Secondary | ICD-10-CM | POA: Diagnosis not present

## 2017-02-03 DIAGNOSIS — J449 Chronic obstructive pulmonary disease, unspecified: Secondary | ICD-10-CM | POA: Diagnosis not present

## 2017-02-03 DIAGNOSIS — R2681 Unsteadiness on feet: Secondary | ICD-10-CM | POA: Diagnosis not present

## 2017-02-03 DIAGNOSIS — R1312 Dysphagia, oropharyngeal phase: Secondary | ICD-10-CM | POA: Diagnosis not present

## 2017-02-03 DIAGNOSIS — R2689 Other abnormalities of gait and mobility: Secondary | ICD-10-CM | POA: Diagnosis not present

## 2017-02-03 DIAGNOSIS — I509 Heart failure, unspecified: Secondary | ICD-10-CM | POA: Diagnosis not present

## 2017-02-03 LAB — BASIC METABOLIC PANEL
ANION GAP: 9 (ref 5–15)
BUN: 21 mg/dL — ABNORMAL HIGH (ref 6–20)
CALCIUM: 9 mg/dL (ref 8.9–10.3)
CO2: 35 mmol/L — ABNORMAL HIGH (ref 22–32)
CREATININE: 0.77 mg/dL (ref 0.44–1.00)
Chloride: 96 mmol/L — ABNORMAL LOW (ref 101–111)
GFR calc Af Amer: >60 mL/min (ref >60)
GLUCOSE: 156 mg/dL — AB (ref 65–99)
Potassium: 4.1 mmol/L (ref 3.5–5.1)
Sodium: 140 mmol/L (ref 135–145)

## 2017-02-03 LAB — GLUCOSE, CAPILLARY
GLUCOSE-CAPILLARY: 197 mg/dL — AB (ref 65–99)
Glucose-Capillary: 154 mg/dL — ABNORMAL HIGH (ref 65–99)
Glucose-Capillary: 138 mg/dL — ABNORMAL HIGH (ref 65–99)

## 2017-02-03 LAB — CK: CK TOTAL: 56 U/L (ref 38–234)

## 2017-02-03 MED ORDER — IPRATROPIUM-ALBUTEROL 0.5-2.5 (3) MG/3ML IN SOLN
3.0000 mL | Freq: Three times a day (TID) | RESPIRATORY_TRACT | Status: DC
  Administered 2017-02-03 (×2): 3 mL via RESPIRATORY_TRACT
  Filled 2017-02-03 (×2): qty 3

## 2017-02-03 MED ORDER — IPRATROPIUM-ALBUTEROL 0.5-2.5 (3) MG/3ML IN SOLN: 3.0000 mL | Freq: Three times a day (TID) | RESPIRATORY_TRACT | 0 refills | Status: DC

## 2017-02-03 MED ORDER — IBUPROFEN 400 MG PO TABS: 400.0000 mg | ORAL_TABLET | Freq: Four times a day (QID) | ORAL | 0 refills | Status: DC | PRN

## 2017-02-03 MED ORDER — IPRATROPIUM-ALBUTEROL 0.5-2.5 (3) MG/3ML IN SOLN: 3.0000 mL | RESPIRATORY_TRACT | Status: DC | PRN

## 2017-02-03 MED ORDER — PANTOPRAZOLE SODIUM 40 MG PO TBEC: 40.0000 mg | DELAYED_RELEASE_TABLET | Freq: Every day | ORAL | 0 refills | Status: DC

## 2017-02-03 MED ORDER — IPRATROPIUM-ALBUTEROL 0.5-2.5 (3) MG/3ML IN SOLN: 3.0000 mL | RESPIRATORY_TRACT | 0 refills | Status: DC | PRN

## 2017-02-03 MED ORDER — BENZONATATE 100 MG PO CAPS: 100.0000 mg | ORAL_CAPSULE | Freq: Three times a day (TID) | ORAL | 0 refills | Status: AC | PRN

## 2017-02-03 MED ORDER — FUROSEMIDE 40 MG PO TABS: 40.0000 mg | ORAL_TABLET | Freq: Every day | ORAL | 0 refills | Status: DC

## 2017-02-03 NOTE — Care Management Note (Signed)
Case Management Note  Patient Details  Name: Isaac LaudBetty H Fornwalt MRN: [id number] Date of Birth: 1941/04/24  Subjective/Objective:                    Action/Plan:d/c SNF.   Expected Discharge Date:  02/03/17               Expected Discharge Plan:  Skilled Nursing Facility  In-House Referral:  Clinical Social Work  Discharge planning Services  CM Consult  Post Acute Care Choice:    Choice offered to:     DME Arranged:    DME Agency:     HH Arranged:    HH Agency:     Status of Service:  Completed, signed off  If discussed at [employer] Meetings, dates discussed:    Additional Comments:  Lanier ClamMahabir, Lester Crickenberger, RN 02/03/2017, 1:02 PM

## 2017-02-03 NOTE — Progress Notes (Addendum)
Physical Therapy Treatment Patient Details Name: Beth Kerr MRN: [id number] DOB: June 08, 1941 Today's Date: 02/03/2017    SATURATION QUALIFICATIONS: (This note is used to comply with regulatory documentation for home oxygen)  Patient Saturations on Room Air at Rest = 92%  Patient Saturations on [employer] with Activity/Ambulating = 82%  Patient Saturations on 2 Liters of oxygen with Activity/Ambulating = 90%    History of Present Illness 76 year old with COPD admitted after being found down on the floor with mild rhabdomyolysis.  Per review of chart, pt was down for 4 days at home.  After admission, developed hypercarbic respiratory failure requiring mechanical ventilation. She was extubated 6/4 and has done well since then per [facility] note    PT Comments     Progressing with mobility. Unable to attempt ambulation in hallway today due to bladder incontinence. Pt took a few steps in room with a RW. She participated well with therapy and should continue to progress well in rehab.    Follow Up Recommendations  SNF     Equipment Recommendations   (TBD at next venue)    Recommendations for Other Services       Precautions / Restrictions Precautions Precautions: Fall Precaution Comments: monitor sats; incontinent when standing Restrictions Weight Bearing Restrictions: No    Mobility  Bed Mobility Overal bed mobility: Needs Assistance Bed Mobility: Supine to Sit;Sit to Supine     Supine to sit: Min guard Sit to supine: Min guard   General bed mobility comments: close guard for safety. Pt c/o lightheadedness.   Transfers Overall transfer level: Needs assistance Equipment used: Rolling walker (2 wheeled) Transfers: Sit to/from [employer] Sit to Stand: Min assist Stand pivot transfers: Min assist       General transfer comment: [facility] safety, hand placement. Assist to rise, stabilize. Stand piviot x2, with RW. Pt is incontinenct of a fair amount of urine  every time she stands.   Ambulation/Gait Ambulation/Gait assistance: Min assist Ambulation Distance (Feet): 4 Feet Assistive device: Rolling walker (2 wheeled)       General Gait Details: Assist to stabilize pt. Short distance on today due to bladder incontinence.    Stairs            Wheelchair Mobility    Modified Rankin (Stroke Patients Only)       Balance Overall balance assessment: Needs assistance         Standing balance support: Bilateral upper extremity supported Standing balance-Leahy Scale: Poor Standing balance comment: requiring RW currently                            Cognition Arousal/Alertness: Awake/alert Behavior During Therapy: WFL for tasks assessed/performed Overall Cognitive Status: Within Functional Limits for tasks assessed                                        Exercises      General Comments        Pertinent Vitals/Pain Pain Assessment: No/denies pain    Home Living                      Prior Function            PT Goals (current goals can now be found in the care plan section) Progress towards PT goals: Progressing toward goals  Frequency    Min 3X/week      PT Plan Current plan remains appropriate    Co-evaluation   Reason for Co-Treatment: For patient/therapist safety (overlapped) PT goals addressed during session: Mobility/safety with mobility OT goals addressed during session: ADL's and self-care      AM-PAC PT "6 Clicks" Daily Activity  Outcome Measure  Difficulty turning over in bed (including adjusting bedclothes, sheets and blankets)?: A Little Difficulty moving from lying on back to sitting on the side of the bed? : A Little Difficulty sitting down on and standing up from a chair with arms (e.g., wheelchair, bedside commode, etc,.)?: A Little Help needed moving to and from a bed to chair (including a wheelchair)?: A Little Help needed walking in hospital  room?: A Little Help needed climbing 3-5 steps with a railing? : A Little 6 Click Score: 18    End of Session Equipment Utilized During Treatment: Oxygen;Gait belt Activity Tolerance: Patient tolerated treatment well Patient left: in bed;with call bell/phone within reach;with bed alarm set   PT Visit Diagnosis: Muscle weakness (generalized) (M62.81);Difficulty in walking, not elsewhere classified (R26.2)     Time: 1100-1139 PT Time Calculation (min) (ACUTE ONLY): 39 min  Charges:  $Therapeutic Activity: 8-22 mins                    G Codes:         Rebeca AlertJannie Nathanel Tallman, MPT Pager: [phone]

## 2017-02-03 NOTE — Progress Notes (Signed)
Patient discharged to SNF via personal car by family member. Discharge packet prepared by CSW and given to family for facility. Patient denies any pain/distress. No pressure ulcer noted.

## 2017-02-03 NOTE — Discharge Summary (Signed)
Physician Discharge Summary  Beth Kerr ZOX:[id number] DOB: May 09, 1941 DOA: 01/27/2017  PCP: Beth Grippe, MD  Admit date: 01/27/2017 Discharge date: 02/03/2017  Admitted From: Home Disposition:  SNF  Recommendations for Outpatient Follow-up:  1. Follow up with PCP in 1-2 weeks 2. Please continue O2 weaning with goal of room air if possible. May require home O2  Discharge Condition:Improved CODE STATUS:Partial - No CPR, ACLS, or Cardioversion/defibrillation Diet recommendation: Diabetic   Brief/Interim Summary: Acute hypoxic and hypercarbic respiratory failure.  Etiology unclear. Likely COPD exacerbation. ?Aspiration PNA -Patient required intubation, extubated on 6/4 -Completed course of augmentin/nebs, off steroids now -O2 weaned to 1-2 L at rest CXR ordered and personally reviewed. Patient with bilateral effusions. Also reviewed recent CT with evidence of tissue swelling -Labs reviewed. Serum bicarb up to 37, thus will transition to PO lasix daily -Decreased BS on exam. Will increase neb tx from BID to q6hrs. No active wheezing -Cont to wean O2, goal of RA if possible. 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 Pt with findings of bilateral non-obstructing renal stones with no hydronephrosis -Patient is asymptomatic, denies flank pain -Plan for continued supportive care for now  noninsulin dependent  diabetes, a1c 7, home oral meds held, on ssi here -Remains stable at this time -Resume metformin on discharge  HTN;home meds norvasc, volsartan /hctz on hold thus far -Vital signs reviewed. bp remains stable thus far  Falls at home, PT consulted, consideration for skilled nursing level of care at time of discharge -Pt and family agreeable to SNF  Discharge Diagnoses:  Principal Problem:   Acute hypercapnic respiratory failure (HCC) Active Problems:   COPD exacerbation (HCC)   Rhabdomyolysis   Elevated troponin   DM (diabetes mellitus), type 2 with renal complications (HCC)   Syncope and collapse   Global amnesia   Elevated CK   Encephalopathy acute   Shock circulatory (HCC)    Discharge Instructions   Allergies as of 02/03/2017      Reactions   Demerol [meperidine] Anaphylaxis      Medication List    STOP taking these medications   amLODipine 10 MG tablet Commonly known as:  NORVASC   JANUVIA 50 MG tablet Generic drug:  sitaGLIPtin   valsartan-hydrochlorothiazide 320-12.5 MG tablet Commonly known as:  DIOVAN-HCT     TAKE these medications   aspirin EC 81 MG tablet Take 81 mg by mouth every morning.   benzonatate 100 MG capsule Commonly known as:  TESSALON Take 1 capsule (100 mg total) by mouth 3 (three) times daily as needed for cough.   CRESTOR 10 MG tablet Generic drug:  rosuvastatin Take 10 mg by mouth every evening.   furosemide 40 MG tablet Commonly known as:  LASIX Take 1 tablet (40 mg total) by mouth daily. Start taking on:  02/04/2017   ibuprofen 400 MG tablet Commonly known as:  ADVIL,MOTRIN Take 1 tablet (400 mg total) by mouth every 6 (six) hours as needed for mild pain or moderate  pain.   ipratropium-albuterol 0.5-2.5 (3) MG/3ML Soln Commonly known as:  DUONEB Take 3 mLs by nebulization 3 (three) times daily.   ipratropium-albuterol 0.5-2.5 (3) MG/3ML Soln Commonly known as:  DUONEB Take 3 mLs by nebulization every 4 (four) hours  as needed.   metFORMIN 1000 MG tablet Commonly known as:  GLUCOPHAGE Take 1,000 mg by mouth 2 (two) times daily with a meal.   pantoprazole 40 MG tablet Commonly known as:  PROTONIX Take 1 tablet (40 mg total) by mouth daily. Start taking on:  02/04/2017      Follow-up Information    Beth Grippe, MD. Schedule an appointment as soon as possible for a visit in 2 week(s).   Specialty:  Internal Medicine Contact information: [address] [place] [address] [place] [address] [phone]          Allergies  Allergen Reactions  . Demerol [Meperidine] Anaphylaxis    Consultations:  Pulmonary  Procedures/Studies: Dg Chest 2 View  Result Date: 01/27/2017 CLINICAL DATA:  Fall and chest pain EXAM: CHEST  2 VIEW COMPARISON:  06/27/2016 FINDINGS: Minimal atelectasis at the left lung base. Slight increased apical opacity on the right. Normal heart size. No pneumothorax. Degenerative changes of the right shoulder with calcific tendinitis. IMPRESSION: 1. Subtle increased density in the apical portion of the right upper lobe, may be artifactual and related to positioning and summation artifact, however short interval radiographic follow-up recommended to exclude parenchymal opacity in the region. 2. Minimal linear atelectasis at the left base. Electronically Signed   By: Jasmine Pang M.D.   On: 01/27/2017 19:06   Dg Ribs Bilateral W/chest  Result Date: 02/02/2017 CLINICAL DATA:  Larey Seat 9 days ago, BILATERAL lower anterior rib pain, history hypertension, diabetes mellitus, COPD EXAM: BILATERAL RIBS AND CHEST - 4+ VIEW COMPARISON:  Earlier 02/01/2017 portable chest radiograph FINDINGS: Borderline enlargement of cardiac silhouette with pulmonary vascular congestion. Mediastinal contours normal. Emphysematous and bronchitic changes. LEFT pleural effusion and basilar atelectasis. Slight accentuation of perihilar markings. No acute infiltrate or pneumothorax. BB placed at site of symptoms lower lateral  LEFT chest and lower lateral RIGHT chest. Diffuse osseous demineralization. No definite rib fracture or bone destruction. IMPRESSION: No acute osseous abnormalities. Emphysematous changes with LEFT basilar atelectasis and pleural effusion. Borderline enlargement of cardiac silhouette with pulmonary vascular congestion. Electronically Signed   By: Ulyses Southward M.D.   On: 02/02/2017 11:32   Ct Head Wo Contrast  Result Date: 01/27/2017 CLINICAL DATA:  Patient fell this past Monday with questionable loss of consciousness. EXAM: CT HEAD WITHOUT CONTRAST TECHNIQUE: Contiguous axial images were obtained from the base of the skull through the vertex without intravenous contrast. COMPARISON:  None. FINDINGS: Brain: No evidence of acute infarction, hemorrhage, hydrocephalus, extra-axial collection or mass lesion/mass effect. Vascular: Moderate carotid siphon calcifications bilaterally. No hyperdense vessels. Skull: No fracture or primary bone lesions. Sinuses/Orbits: Clear mastoids and paranasal sinuses. Intact orbits and globes. Bilateral lens replacements surgeries. Other: Mild left posterior parietal scalp contusion. IMPRESSION: No acute intracranial abnormality. Left posterior parietal scalp contusion. Electronically Signed   By: Tollie Eth M.D.   On: 01/27/2017 18:54   Ct Angio Chest Pe W Or Wo Contrast  Result Date: 01/28/2017 CLINICAL DATA:  Hypoxic event EXAM: CT ANGIOGRAPHY CHEST WITH CONTRAST TECHNIQUE: Multidetector CT imaging of the chest was performed using the standard protocol during bolus administration of intravenous contrast. Multiplanar CT image reconstructions and MIPs were obtained to evaluate the vascular anatomy. CONTRAST:  100 mL Isovue 370  intravenous COMPARISON:  Radiograph 01/27/2017 FINDINGS: Cardiovascular: Satisfactory opacification of the pulmonary arteries to the segmental level. No evidence of pulmonary embolism. Non aneurysmal aorta. Atherosclerotic calcifications. Coronary artery  calcifications. Normal heart size. No pericardial effusion. Mediastinum/Nodes: Esophagus within normal limits. Midline trachea. No thyroid mass. No significantly enlarged mediastinal lymph nodes. Lungs/Pleura: Mild emphysema within the upper lobes. Small area of focal consolidation in the lingula may reflect atelectasis or a small infiltrate. Trace pleural effusions. Upper Abdomen: 13 mm left adrenal gland nodule. Diffuse nodularity of the right adrenal gland partially visualized. Musculoskeletal: Mild compression of of T7. Review of the MIP images confirms the above findings. IMPRESSION: 1. Negative for acute pulmonary embolus. 2. Small focal consolidation in the lingula may reflect atelectasis or small infiltrate. There are trace effusions 3. Mild emphysema 4. Indeterminate 13 mm left adrenal gland nodule partially visualized. Diffuse nodularity of the right adrenal gland. Further evaluation with adrenal CT is suggested. Electronically Signed   By: Jasmine Pang M.D.   On: 01/28/2017 03:38   Mr Laqueta Jean ZO Contrast  Result Date: 01/28/2017 CLINICAL DATA:  Memory loss EXAM: MRI HEAD WITHOUT AND WITH CONTRAST TECHNIQUE: Multiplanar, multiecho pulse sequences of the brain and surrounding structures were obtained without and with intravenous contrast. CONTRAST:  10mL MULTIHANCE GADOBENATE DIMEGLUMINE 529 MG/ML IV SOLN COMPARISON:  CT 01/27/2017 FINDINGS: Brain: Mild generalized brain atrophy. Diffusion imaging does not show any acute or subacute infarction. The brainstem and cerebellum are normal. Cerebral hemispheres show mild chronic small-vessel change of the white matter, common at this age. No cortical or large vessel territory infarction. No mass lesion, hemorrhage, hydrocephalus or extra-axial collection. After contrast administration, no abnormal enhancement occurs. Vascular: Major vessels at the base of the brain show flow. Skull and upper cervical spine: Negative Sinuses/Orbits: Clear/normal Other: None  significant IMPRESSION: No acute or reversible finding. Age related atrophy and ordinary small vessel change of the hemispheric white matter. Electronically Signed   By: Paulina Fusi M.D.   On: 01/28/2017 15:54   Dg Chest Port 1 View  Result Date: 02/01/2017 CLINICAL DATA:  Hypoxia EXAM: PORTABLE CHEST 1 VIEW COMPARISON:  01/30/2017 FINDINGS: There is no focal consolidation. There are small bilateral pleural effusions. There is no pneumothorax. The heart and mediastinal contours are unremarkable. The osseous structures are unremarkable. IMPRESSION: Small bilateral pleural effusions. Electronically Signed   By: Elige Ko   On: 02/01/2017 11:30   Dg Chest Port 1 View  Result Date: 01/30/2017 CLINICAL DATA:  Intubation. EXAM: PORTABLE CHEST 1 VIEW COMPARISON:  01/29/2017 . FINDINGS: Interim placement of NG tube, its tip is below the left hemidiaphragm. Endotracheal tube in stable position. Heart size normal. Bilateral pulmonary infiltrates/edema with small bilateral pleural effusions again noted. No pneumothorax . IMPRESSION: 1. Interim placement NG tube, its tip below left hemidiaphragm. endotracheal tube in stable position. 2. Persistent bilateral pulmonary infiltrates/ edema with bilateral pleural effusions. Similar findings noted on prior exam. Heart size stable . Electronically Signed   By: Maisie Fus  Register   On: 01/30/2017 06:54   Dg Chest Port 1 View  Result Date: 01/29/2017 CLINICAL DATA:  Endotracheal tube placement EXAM: PORTABLE CHEST 1 VIEW COMPARISON:  01/28/2017 and prior exams FINDINGS: Cardiomegaly and pulmonary vascular congestion again noted. An endotracheal tube is identified with tip 4.3 cm above the carina. Increased bibasilar opacities/atelectasis noted. There is no evidence of pneumothorax. IMPRESSION: Endotracheal tube with tip 4.7 cm above the carina. Increased bibasilar opacities/atelectasis. Electronically Signed   By: Harmon Pier  M.D.   On: 01/29/2017 10:06   Dg Chest Port 1  View  Result Date: 01/28/2017 CLINICAL DATA:  Shortness of breath. EXAM: PORTABLE CHEST 1 VIEW COMPARISON:  Single-view of the chest and CT chest earlier today. FINDINGS: There is mild bibasilar atelectasis. The lungs are otherwise clear. Heart size is normal. No pneumothorax. Aortic atherosclerosis noted. IMPRESSION: No acute disease. Electronically Signed   By: Drusilla Kannerhomas  Dalessio M.D.   On: 01/28/2017 11:13   Dg Chest Port 1 View  Result Date: 01/28/2017 CLINICAL DATA:  Acute onset of hypoxia.  Initial encounter. EXAM: PORTABLE CHEST 1 VIEW COMPARISON:  Chest radiograph performed 01/27/2017, and CTA of the chest performed earlier today at 3:15 a.m. FINDINGS: The lungs are well-aerated. Mild vascular congestion is noted. Mild bibasilar airspace opacities are noted, possibly reflecting atelectasis or mild pneumonia. This appears new at the right lung base. There is no evidence of pleural effusion or pneumothorax. The cardiomediastinal silhouette is borderline enlarged. No acute osseous abnormalities are seen. IMPRESSION: Mild vascular congestion and borderline cardiomegaly. Mild bibasilar airspace opacities may reflect atelectasis or mild pneumonia. Electronically Signed   By: Roanna RaiderJeffery  Chang M.D.   On: 01/28/2017 06:10   Dg Abd Portable 1v  Result Date: 01/29/2017 CLINICAL DATA:  OG tube placement EXAM: PORTABLE ABDOMEN - 1 VIEW COMPARISON:  Chest radiograph- earlier same day FINDINGS: Enteric tube tip and side port projected the location of the gastric fundus. Mild gaseous distention of a loop of small bowel in the left upper abdomen measuring approximately 4.1 cm in diameter. Limited visualization of lower thorax demonstrates small left and trace right-sided pleural effusions with associated bibasilar heterogeneous / consolidative opacities, left greater than right. No acute osseus abnormalities. IMPRESSION: 1. Enteric tube tip and side port project over the expected location of the gastric fundus. 2. Grossly  unchanged small left and trace right-sided effusion with associated bibasilar heterogeneous/consolidative opacities, left greater than right. Electronically Signed   By: Simonne ComeJohn  Watts M.D.   On: 01/29/2017 13:06   Ct Renal Stone Study  Result Date: 01/31/2017 CLINICAL DATA:  Hematuria. EXAM: CT ABDOMEN AND PELVIS WITHOUT CONTRAST TECHNIQUE: Multidetector CT imaging of the abdomen and pelvis was performed following the standard protocol without IV contrast. COMPARISON:  CT 09/05/2011 FINDINGS: Lower chest: Small to moderate bilateral pleural effusions with adjacent compressive atelectasis. Linear atelectasis in the lingula and right lower lobe. Heart is normal in size, there are coronary artery calcifications. Hepatobiliary: No focal liver abnormality is seen. No gallstones, gallbladder wall thickening, or biliary dilatation. Pancreas: No ductal dilatation or inflammation. Spleen: Normal in size without focal abnormality. Adrenals/Urinary Tract: Low-density 11 mm left adrenal nodule is unchanged from prior CT and likely adenoma. Mild bilateral adrenal thickening, stable. There is an 8 mm nonobstructing stone in the lower left kidney. 2 mm calcification in the interpolar right kidney may be a nonobstructing stone or parenchymal calcification. No hydronephrosis. Parapelvic cysts in the left kidney as seen on prior exam. The ureters are decompressed. Urinary bladder is physiologically distended. Minimal layering hyperdensity in the bladder. Stomach/Bowel: Colonic diverticulosis involving the entire colon, most prominent distally. No diverticulitis or acute bowel inflammation. Normal appendix. Stomach distended with ingested contents. Vascular/Lymphatic: Aortic and branch atherosclerosis without aneurysm. No adenopathy. Reproductive: Soft tissue prominence in the region of the cervix. Uterus is otherwise quiescent. No adnexal mass. Other: Dependent body wall edema most prominent in the flanks. No abdominopelvic ascites.  No free air. Musculoskeletal: Degenerative change in the spine with degenerative disc disease  and facet arthropathy. There are no acute or suspicious osseous abnormalities. IMPRESSION: 1. Nonobstructing stone in the left kidney. Possible nonobstructing right nephrolithiasis versus small parenchymal calcification. No hydronephrosis or obstructive uropathy. 2. Dependent hyperdensity within the urinary bladder may be small stones or hyperdense debris, as seen with urinary tract infection. 3. Small to moderate bilateral pleural effusions. Body wall edema most prominent in the flanks. 4. Soft tissue prominence in the region of the cervix, of unknown significance. Recommend correlation with pelvic exam. 5. Incidental findings of aortic and branch atherosclerosis, as well as coronary artery calcifications. Colonic diverticulosis without acute inflammation. Electronically Signed   By: Rubye Oaks M.D.   On: 01/31/2017 21:08    Subjective: No complaints today  Discharge Exam: Vitals:   02/02/17 2118 02/03/17 0439  BP: (!) 115/48 (!) 131/53  Pulse: 72 66  Resp: 18 18  Temp: 97.6 F (36.4 C) 98.2 F (36.8 C)   Vitals:   02/02/17 2118 02/03/17 0439 02/03/17 0500 02/03/17 0741  BP: (!) 115/48 (!) 131/53    Pulse: 72 66    Resp: 18 18    Temp: 97.6 F (36.4 C) 98.2 F (36.8 C)    TempSrc: Oral Oral    SpO2: 95% 96%  93%  Weight:   73 kg (160 lb 15 oz)   Height:        General: Pt is alert, awake, not in acute distress Cardiovascular: RRR, S1/S2 +, no rubs, no gallops Respiratory: CTA bilaterally, no wheezing, no rhonchi Abdominal: Soft, NT, ND, bowel sounds + Extremities: no edema, no cyanosis   The results of significant diagnostics from this hospitalization (including imaging, microbiology, ancillary and laboratory) are listed below for reference.     Microbiology: Recent Results (from the past 240 hour(s))  Urine culture     Status: None   Collection Time: 01/27/17  6:02 PM   Result Value Ref Range Status   Specimen Description URINE, RANDOM  Final   Special Requests NONE  Final   Culture   Final    NO GROWTH Performed at [facility], [address], [place], [place] [address]    Report Status 01/29/2017 FINAL  Final  MRSA PCR Screening     Status: None   Collection Time: 01/28/17  6:52 AM  Result Value Ref Range Status   MRSA by PCR NEGATIVE NEGATIVE Final    Comment:        The GeneXpert MRSA Assay (FDA approved for NASAL specimens only), is one component of a comprehensive MRSA colonization surveillance program. It is not intended to diagnose MRSA infection nor to guide or monitor treatment for MRSA infections.   Respiratory Panel by PCR     Status: None   Collection Time: 01/28/17 11:02 AM  Result Value Ref Range Status   Adenovirus NOT DETECTED NOT DETECTED Final   Coronavirus 229E NOT DETECTED NOT DETECTED Final   Coronavirus HKU1 NOT DETECTED NOT DETECTED Final   Coronavirus NL63 NOT DETECTED NOT DETECTED Final   Coronavirus OC43 NOT DETECTED NOT DETECTED Final   Metapneumovirus NOT DETECTED NOT DETECTED Final   Rhinovirus / Enterovirus NOT DETECTED NOT DETECTED Final   Influenza A NOT DETECTED NOT DETECTED Final   Influenza B NOT DETECTED NOT DETECTED Final   Parainfluenza Virus 1 NOT DETECTED NOT DETECTED Final   Parainfluenza Virus 2 NOT DETECTED NOT DETECTED Final   Parainfluenza Virus 3 NOT DETECTED NOT DETECTED Final   Parainfluenza Virus 4 NOT DETECTED NOT  DETECTED Final   Respiratory Syncytial Virus NOT DETECTED NOT DETECTED Final   Bordetella pertussis NOT DETECTED NOT DETECTED Final   Chlamydophila pneumoniae NOT DETECTED NOT DETECTED Final   Mycoplasma pneumoniae NOT DETECTED NOT DETECTED Final    Comment: Performed at [facility], [address], [place], [place] [address]  Culture, blood (Routine X 2) w Reflex to ID Panel     Status: None   Collection Time: 01/28/17 11:14 AM  Result Value Ref Range Status    Specimen Description BLOOD LEFT HAND  Final   Special Requests IN PEDIATRIC BOTTLE Blood Culture adequate volume  Final   Culture   Final    NO GROWTH 5 DAYS Performed at [facility], [address] [place], [place] [address]    Report Status 02/02/2017 FINAL  Final  Culture, blood (Routine X 2) w Reflex to ID Panel     Status: None   Collection Time: 01/28/17 11:14 AM  Result Value Ref Range Status   Specimen Description BLOOD RIGHT HAND  Final   Special Requests IN PEDIATRIC BOTTLE Blood Culture adequate volume  Final   Culture   Final    NO GROWTH 5 DAYS Performed at [facility], [address] [place], [place] [address]    Report Status 02/02/2017 FINAL  Final     Labs: BNP (last 3 results) No results for input(s): BNP in the last 8760 hours. Basic Metabolic Panel:  Recent Labs Lab 01/29/17 0349 01/30/17 0338 01/31/17 0323 02/01/17 0516 02/02/17 0532 02/03/17 0439  NA 146* 149*  --  145 146* 140  K 3.9 4.0  --  3.7 3.4* 4.1  CL 111 113*  --  107 100* 96*  CO2 28 29  --  31 37* 35*  GLUCOSE 225* 251*  --  147* 132* 156*  BUN 58* 60*  --  34* 26* 21*  CREATININE 1.04* 0.98 1.00 0.82 0.77 0.77  CALCIUM 7.8* 8.0*  --  8.5* 8.9 9.0  MG 2.3 2.5* 2.4 2.4 2.1  --   PHOS 3.9 2.8 3.7 3.2 4.9*  --    Liver Function Tests:  Recent Labs Lab 01/27/17 1918 01/28/17 0523  AST 41 31  ALT 19 17  ALKPHOS 43 39  BILITOT 0.6 0.4  PROT 7.3 6.7  ALBUMIN 3.8 3.2*   No results for input(s): LIPASE, AMYLASE in the last 168 hours. No results for input(s): AMMONIA in the last 168 hours. CBC:  Recent Labs Lab 01/27/17 1918  01/29/17 0349 01/30/17 0338 01/31/17 0323 02/01/17 0516 02/02/17 0532  WBC 13.0*  < > 14.7* 9.9 7.7 6.6 7.1  NEUTROABS 11.5*  --   --  9.2* 6.6 5.1 5.6  HGB 13.7  < > 11.8* 10.7* 10.3* 10.4* 11.6*  HCT 43.9  < > 38.8 35.5* 34.9* 33.8* 38.0  MCV 102.1*  < > 103.5* 104.4* 102.6* 102.7* 101.6*  PLT 209  < > 241 167 160 152 169  < >  = values in this interval not displayed. Cardiac Enzymes:  Recent Labs Lab 01/27/17 1918 01/27/17 2143 01/28/17 9147 01/28/17 1114  01/30/17 8295 01/31/17 0323 02/01/17 0516 02/02/17 0532 02/03/17 0439  CKTOTAL 1,612*  --  913*  --   < > 430* 330* 143 79 56  TROPONINI 0.09* 0.07* 0.08* 0.05*  --  <0.03  --   --   --   --   < > = values in this interval not displayed. BNP:  Invalid input(s): POCBNP CBG:  Recent Labs Lab 02/02/17 1956 02/03/17 0000 02/03/17 0419 02/03/17 0731 02/03/17 1146  GLUCAP 176* 132* 154* 138* 197*   D-Dimer No results for input(s): DDIMER in the last 72 hours. Hgb A1c No results for input(s): HGBA1C in the last 72 hours. Lipid Profile No results for input(s): CHOL, HDL, LDLCALC, TRIG, CHOLHDL, LDLDIRECT in the last 72 hours. Thyroid function studies No results for input(s): TSH, T4TOTAL, T3FREE, THYROIDAB in the last 72 hours.  Invalid input(s): FREET3 Anemia work up No results for input(s): VITAMINB12, FOLATE, FERRITIN, TIBC, IRON, RETICCTPCT in the last 72 hours. Urinalysis    Component Value Date/Time   COLORURINE YELLOW 01/30/2017 2207   APPEARANCEUR HAZY (A) 01/30/2017 2207   LABSPEC 1.024 01/30/2017 2207   PHURINE 5.0 01/30/2017 2207   GLUCOSEU 50 (A) 01/30/2017 2207   HGBUR LARGE (A) 01/30/2017 2207   BILIRUBINUR NEGATIVE 01/30/2017 2207   KETONESUR NEGATIVE 01/30/2017 2207   PROTEINUR 30 (A) 01/30/2017 2207   NITRITE NEGATIVE 01/30/2017 2207   LEUKOCYTESUR SMALL (A) 01/30/2017 2207   Sepsis Labs Invalid input(s): PROCALCITONIN,  WBC,  LACTICIDVEN Microbiology Recent Results (from the past 240 hour(s))  Urine culture     Status: None   Collection Time: 01/27/17  6:02 PM  Result Value Ref Range Status   Specimen Description URINE, RANDOM  Final   Special Requests NONE  Final   Culture   Final    NO GROWTH Performed at [facility], [address] [place], [place] [address]    Report Status 01/29/2017 FINAL  Final   MRSA PCR Screening     Status: None   Collection Time: 01/28/17  6:52 AM  Result Value Ref Range Status   MRSA by PCR NEGATIVE NEGATIVE Final    Comment:        The GeneXpert MRSA Assay (FDA approved for NASAL specimens only), is one component of a comprehensive MRSA colonization surveillance program. It is not intended to diagnose MRSA infection nor to guide or monitor treatment for MRSA infections.   Respiratory Panel by PCR     Status: None   Collection Time: 01/28/17 11:02 AM  Result Value Ref Range Status   Adenovirus NOT DETECTED NOT DETECTED Final   Coronavirus 229E NOT DETECTED NOT DETECTED Final   Coronavirus HKU1 NOT DETECTED NOT DETECTED Final   Coronavirus NL63 NOT DETECTED NOT DETECTED Final   Coronavirus OC43 NOT DETECTED NOT DETECTED Final   Metapneumovirus NOT DETECTED NOT DETECTED Final   Rhinovirus / Enterovirus NOT DETECTED NOT DETECTED Final   Influenza A NOT DETECTED NOT DETECTED Final   Influenza B NOT DETECTED NOT DETECTED Final   Parainfluenza Virus 1 NOT DETECTED NOT DETECTED Final   Parainfluenza Virus 2 NOT DETECTED NOT DETECTED Final   Parainfluenza Virus 3 NOT DETECTED NOT DETECTED Final   Parainfluenza Virus 4 NOT DETECTED NOT DETECTED Final   Respiratory Syncytial Virus NOT DETECTED NOT DETECTED Final   Bordetella pertussis NOT DETECTED NOT DETECTED Final   Chlamydophila pneumoniae NOT DETECTED NOT DETECTED Final   Mycoplasma pneumoniae NOT DETECTED NOT DETECTED Final    Comment: Performed at [facility], [address], [place], [place] [address]  Culture, blood (Routine X 2) w Reflex to ID Panel     Status: None   Collection Time: 01/28/17 11:14 AM  Result Value Ref Range Status   Specimen Description BLOOD LEFT HAND  Final   Special Requests IN PEDIATRIC BOTTLE Blood  Culture adequate volume  Final   Culture   Final    NO GROWTH 5 DAYS Performed at [facility], [address] [place], [place] [address]    Report Status  02/02/2017 FINAL  Final  Culture, blood (Routine X 2) w Reflex to ID Panel     Status: None   Collection Time: 01/28/17 11:14 AM  Result Value Ref Range Status   Specimen Description BLOOD RIGHT HAND  Final   Special Requests IN PEDIATRIC BOTTLE Blood Culture adequate volume  Final   Culture   Final    NO GROWTH 5 DAYS Performed at [facility], [address], [place], [place] [address]    Report Status 02/02/2017 FINAL  Final     SIGNED:   Jerald Kief, MD  [facility] 02/03/2017, 12:37 PM  If 7PM-7AM, please contact night-coverage [email or web address] Password [email or web address]

## 2017-02-03 NOTE — Clinical Social Work Placement (Addendum)
   CLINICAL SOCIAL WORK PLACEMENT  NOTE  Date:  02/03/2017  Patient Details  Name: Beth Kerr MRN: [id number] Date of Birth: Jul 29, 1941  Clinical Social Work is seeking post-discharge placement for this patient at the Skilled  Nursing Facility level of care (*CSW will initial, date and re-position this form in  chart as items are completed):  Yes   Patient/family provided with [place] Clinical Social Work Department's list of facilities offering this level of care within the geographic area requested by the patient (or if unable, by the patient's family).  Yes   Patient/family informed of their freedom to choose among providers that offer the needed level of care, that participate in Medicare, Medicaid or managed care program needed by the patient, have an available bed and are willing to accept the patient.  Yes   Patient/family informed of [facility]'s ownership interest in [facility] and [facility], as well as of the fact that they are under no obligation to receive care at these facilities.  PASRR submitted to EDS on 02/01/17     PASRR number received on 02/01/17     Existing PASRR number confirmed on       FL2 transmitted to all facilities in geographic area requested by pt/family on 02/01/17     FL2 transmitted to all facilities within larger geographic area on       Patient informed that his/her managed care company has contracts with or will negotiate with certain facilities, including the following:        Yes   Patient/family informed of bed offers received.  [facility]     Physician recommends and patient chooses bed at      Patient to be [facility] on 02/03/17.  Patient to be transferred to facility by family     Patient family notified on 02/03/17 of transfer.  Name of family member notified:  Ruby      PHYSICIAN       Additional Comment: Pt / family are in agreement with plans for dc to [facility] today. Pt requires oxygen for transport. Family is aware and will provide oxygen tank for transport. [employer] medicare has provided authorization for SNF placement. D/C Summary sent to SNF for review. No scripts printed. # for report provided to nsg. D/C packet provided to pt.   _______________________________________________ Royetta AsalHaidinger, Jamahl Lemmons Lee, LCSW  [phone] 02/03/2017, 3:09 PM

## 2017-02-03 NOTE — Progress Notes (Signed)
Occupational Therapy Treatment Patient Details Name: Beth Kerr MRN: [id number] DOB: [birth year] Today's Date: 02/03/2017    History of present illness 76 year old with COPD admitted after being found down on the floor with mild rhabdomyolysis.  Per review of chart, pt was down for 4 days at home.  After admission, developed hypercarbic respiratory failure requiring mechanical ventilation. She was extubated 6/4 and has done well since then per CCS note   OT comments  Overlapped with PT.  Had planned to ambulate with pt, but she becomes incontinent of a lot of urine when standing.  Performed transfer to 3:1, LB bathing, and educated on energy conservation and pursed lip breathing  Follow Up Recommendations  SNF    Equipment Recommendations  3 in 1 bedside commode    Recommendations for Other Services      Precautions / Restrictions Precautions Precautions: Fall Precaution Comments: monitor sats; incontinent when standing Restrictions Weight Bearing Restrictions: No       Mobility Bed Mobility           Sit to supine: Min guard   General bed mobility comments: extra time  Transfers   Equipment used: Rolling walker (2 wheeled)   Sit to Stand: Min assist Stand pivot transfers: Min assist       General transfer comment: assistance to rise and stabilize    Balance                                           ADL either performed or assessed with clinical judgement   ADL               Lower Body Bathing: Minimal assistance;Sit to/from stand           Toilet Transfer: Minimal assistance;Stand-pivot;RW;BSC   Toileting- Clothing Manipulation and Hygiene: Maximal assistance;Sit to/from stand         General ADL Comments: pt became incontinent x urine when standing (twice, large amount).  Assisted with hygiene and returned to bed. Talked about energy conservattion and Pursed lip breathing.  Pt does not feel any dyspnea when sats drop,  and they dropped to 805 when on RA, washing legs.     Vision       Perception     Praxis      Cognition Arousal/Alertness: Awake/alert Behavior During Therapy: WFL for tasks assessed/performed Overall Cognitive Status: Within Functional Limits for tasks assessed                                          Exercises     Shoulder Instructions       General Comments      Pertinent Vitals/ Pain       Pain Assessment: No/denies pain  Home Living                                          Prior Functioning/Environment              Frequency  Min 2X/week        Progress Toward Goals  OT Goals(current goals can now be found in the care plan section)  Progress towards OT goals: Progressing toward goals  Plan      Co-evaluation    PT/OT/SLP Co-Evaluation/Treatment: Yes Reason for Co-Treatment: For patient/therapist safety (overlapped) PT goals addressed during session: Mobility/safety with mobility OT goals addressed during session: ADL's and self-care      AM-PAC PT "6 Clicks" Daily Activity     Outcome Measure   Help from another person eating meals?: None Help from another person taking care of personal grooming?: A Little Help from another person toileting, which includes using toliet, bedpan, or urinal?: A Lot Help from another person bathing (including washing, rinsing, drying)?: A Little Help from another person to put on and taking off regular upper body clothing?: A Little Help from another person to put on and taking off regular lower body clothing?: A Lot 6 Click Score: 17    End of Session    OT Visit Diagnosis: Muscle weakness (generalized) (M62.81)   Activity Tolerance Patient tolerated treatment well   Patient Left with call bell/phone within reach;in bed;with bed alarm set   Nurse Communication          Time: 1610-9604 OT Time Calculation (min): 24 min  Charges: OT General Charges $OT  Visit: 1 Procedure OT Treatments $Self Care/Home Management : 8-22 mins  Marica Otter, OTR/L [phone] 02/03/2017   Beth Kerr 02/03/2017, 11:49 AM

## 2017-02-03 NOTE — Progress Notes (Signed)
Report given to Selena BattenKim at [facility]. No questions or concerns at this time.

## 2017-02-06 DIAGNOSIS — J9690 Respiratory failure, unspecified, unspecified whether with hypoxia or hypercapnia: Secondary | ICD-10-CM | POA: Diagnosis not present

## 2017-02-06 DIAGNOSIS — I509 Heart failure, unspecified: Secondary | ICD-10-CM | POA: Diagnosis not present

## 2017-02-06 DIAGNOSIS — G479 Sleep disorder, unspecified: Secondary | ICD-10-CM | POA: Diagnosis not present

## 2017-02-06 DIAGNOSIS — J449 Chronic obstructive pulmonary disease, unspecified: Secondary | ICD-10-CM | POA: Diagnosis not present

## 2017-02-08 DIAGNOSIS — G479 Sleep disorder, unspecified: Secondary | ICD-10-CM | POA: Diagnosis not present

## 2017-02-17 DIAGNOSIS — M549 Dorsalgia, unspecified: Secondary | ICD-10-CM | POA: Diagnosis not present

## 2017-02-20 DIAGNOSIS — M549 Dorsalgia, unspecified: Secondary | ICD-10-CM | POA: Diagnosis not present

## 2017-02-22 ENCOUNTER — Ambulatory Visit: Payer: Medicare Other | Admitting: Podiatry

## 2017-02-22 DIAGNOSIS — I509 Heart failure, unspecified: Secondary | ICD-10-CM | POA: Diagnosis not present

## 2017-02-24 DIAGNOSIS — M6282 Rhabdomyolysis: Secondary | ICD-10-CM | POA: Diagnosis not present

## 2017-02-24 DIAGNOSIS — J441 Chronic obstructive pulmonary disease with (acute) exacerbation: Secondary | ICD-10-CM | POA: Diagnosis not present

## 2017-02-24 DIAGNOSIS — R2689 Other abnormalities of gait and mobility: Secondary | ICD-10-CM | POA: Diagnosis not present

## 2017-02-24 DIAGNOSIS — M6281 Muscle weakness (generalized): Secondary | ICD-10-CM | POA: Diagnosis not present

## 2017-03-02 DIAGNOSIS — I5032 Chronic diastolic (congestive) heart failure: Secondary | ICD-10-CM | POA: Diagnosis not present

## 2017-03-02 DIAGNOSIS — R32 Unspecified urinary incontinence: Secondary | ICD-10-CM | POA: Diagnosis not present

## 2017-03-02 DIAGNOSIS — I110 Hypertensive heart disease with heart failure: Secondary | ICD-10-CM | POA: Diagnosis not present

## 2017-03-02 DIAGNOSIS — Z7984 Long term (current) use of oral hypoglycemic drugs: Secondary | ICD-10-CM | POA: Diagnosis not present

## 2017-03-02 DIAGNOSIS — M6281 Muscle weakness (generalized): Secondary | ICD-10-CM | POA: Diagnosis not present

## 2017-03-02 DIAGNOSIS — D539 Nutritional anemia, unspecified: Secondary | ICD-10-CM | POA: Diagnosis not present

## 2017-03-02 DIAGNOSIS — J449 Chronic obstructive pulmonary disease, unspecified: Secondary | ICD-10-CM | POA: Diagnosis not present

## 2017-03-02 DIAGNOSIS — R1312 Dysphagia, oropharyngeal phase: Secondary | ICD-10-CM | POA: Diagnosis not present

## 2017-03-02 DIAGNOSIS — Z7982 Long term (current) use of aspirin: Secondary | ICD-10-CM | POA: Diagnosis not present

## 2017-03-02 DIAGNOSIS — E1129 Type 2 diabetes mellitus with other diabetic kidney complication: Secondary | ICD-10-CM | POA: Diagnosis not present

## 2017-03-06 DIAGNOSIS — M8580 Other specified disorders of bone density and structure, unspecified site: Secondary | ICD-10-CM | POA: Diagnosis not present

## 2017-03-06 DIAGNOSIS — E785 Hyperlipidemia, unspecified: Secondary | ICD-10-CM | POA: Diagnosis not present

## 2017-03-06 DIAGNOSIS — I10 Essential (primary) hypertension: Secondary | ICD-10-CM | POA: Diagnosis not present

## 2017-03-06 DIAGNOSIS — E119 Type 2 diabetes mellitus without complications: Secondary | ICD-10-CM | POA: Diagnosis not present

## 2017-03-13 DIAGNOSIS — M1990 Unspecified osteoarthritis, unspecified site: Secondary | ICD-10-CM | POA: Diagnosis not present

## 2017-03-13 DIAGNOSIS — J449 Chronic obstructive pulmonary disease, unspecified: Secondary | ICD-10-CM | POA: Diagnosis not present

## 2017-03-29 DIAGNOSIS — I10 Essential (primary) hypertension: Secondary | ICD-10-CM | POA: Diagnosis not present

## 2017-03-29 DIAGNOSIS — M8580 Other specified disorders of bone density and structure, unspecified site: Secondary | ICD-10-CM | POA: Diagnosis not present

## 2017-03-29 DIAGNOSIS — E119 Type 2 diabetes mellitus without complications: Secondary | ICD-10-CM | POA: Diagnosis not present

## 2017-04-10 DIAGNOSIS — I10 Essential (primary) hypertension: Secondary | ICD-10-CM | POA: Diagnosis not present

## 2017-04-10 DIAGNOSIS — E785 Hyperlipidemia, unspecified: Secondary | ICD-10-CM | POA: Diagnosis not present

## 2017-04-10 DIAGNOSIS — Z0000 Encounter for general adult medical examination without abnormal findings: Secondary | ICD-10-CM | POA: Diagnosis not present

## 2017-04-10 DIAGNOSIS — E119 Type 2 diabetes mellitus without complications: Secondary | ICD-10-CM | POA: Diagnosis not present

## 2017-04-18 DIAGNOSIS — R600 Localized edema: Secondary | ICD-10-CM | POA: Diagnosis not present

## 2017-04-18 DIAGNOSIS — I5032 Chronic diastolic (congestive) heart failure: Secondary | ICD-10-CM | POA: Diagnosis not present

## 2017-04-18 DIAGNOSIS — I10 Essential (primary) hypertension: Secondary | ICD-10-CM | POA: Diagnosis not present

## 2017-04-18 DIAGNOSIS — I6523 Occlusion and stenosis of bilateral carotid arteries: Secondary | ICD-10-CM | POA: Diagnosis not present

## 2017-05-02 DIAGNOSIS — I10 Essential (primary) hypertension: Secondary | ICD-10-CM | POA: Diagnosis not present

## 2017-05-08 DIAGNOSIS — E119 Type 2 diabetes mellitus without complications: Secondary | ICD-10-CM | POA: Diagnosis not present

## 2017-05-08 DIAGNOSIS — I10 Essential (primary) hypertension: Secondary | ICD-10-CM | POA: Diagnosis not present

## 2017-05-08 DIAGNOSIS — R197 Diarrhea, unspecified: Secondary | ICD-10-CM | POA: Diagnosis not present

## 2017-05-08 DIAGNOSIS — Z23 Encounter for immunization: Secondary | ICD-10-CM | POA: Diagnosis not present

## 2017-05-08 DIAGNOSIS — E785 Hyperlipidemia, unspecified: Secondary | ICD-10-CM | POA: Diagnosis not present

## 2017-05-22 DIAGNOSIS — N179 Acute kidney failure, unspecified: Secondary | ICD-10-CM | POA: Diagnosis not present

## 2017-05-22 DIAGNOSIS — Z5181 Encounter for therapeutic drug level monitoring: Secondary | ICD-10-CM | POA: Diagnosis not present

## 2017-05-22 DIAGNOSIS — E1169 Type 2 diabetes mellitus with other specified complication: Secondary | ICD-10-CM | POA: Diagnosis not present

## 2017-05-22 DIAGNOSIS — E7800 Pure hypercholesterolemia, unspecified: Secondary | ICD-10-CM | POA: Diagnosis not present

## 2017-05-22 DIAGNOSIS — Z79899 Other long term (current) drug therapy: Secondary | ICD-10-CM | POA: Diagnosis not present

## 2017-05-22 DIAGNOSIS — I10 Essential (primary) hypertension: Secondary | ICD-10-CM | POA: Diagnosis not present

## 2017-05-27 DIAGNOSIS — M6281 Muscle weakness (generalized): Secondary | ICD-10-CM | POA: Diagnosis not present

## 2017-05-27 DIAGNOSIS — R2689 Other abnormalities of gait and mobility: Secondary | ICD-10-CM | POA: Diagnosis not present

## 2017-05-27 DIAGNOSIS — J441 Chronic obstructive pulmonary disease with (acute) exacerbation: Secondary | ICD-10-CM | POA: Diagnosis not present

## 2017-05-27 DIAGNOSIS — M6282 Rhabdomyolysis: Secondary | ICD-10-CM | POA: Diagnosis not present

## 2017-06-21 DIAGNOSIS — I6523 Occlusion and stenosis of bilateral carotid arteries: Secondary | ICD-10-CM | POA: Diagnosis not present

## 2017-06-26 DIAGNOSIS — R2689 Other abnormalities of gait and mobility: Secondary | ICD-10-CM | POA: Diagnosis not present

## 2017-06-26 DIAGNOSIS — J441 Chronic obstructive pulmonary disease with (acute) exacerbation: Secondary | ICD-10-CM | POA: Diagnosis not present

## 2017-06-26 DIAGNOSIS — M6282 Rhabdomyolysis: Secondary | ICD-10-CM | POA: Diagnosis not present

## 2017-06-26 DIAGNOSIS — M6281 Muscle weakness (generalized): Secondary | ICD-10-CM | POA: Diagnosis not present

## 2017-07-18 DIAGNOSIS — E1169 Type 2 diabetes mellitus with other specified complication: Secondary | ICD-10-CM | POA: Diagnosis not present

## 2017-07-18 DIAGNOSIS — I10 Essential (primary) hypertension: Secondary | ICD-10-CM | POA: Diagnosis not present

## 2017-07-19 DIAGNOSIS — I10 Essential (primary) hypertension: Secondary | ICD-10-CM | POA: Diagnosis not present

## 2017-07-19 DIAGNOSIS — R600 Localized edema: Secondary | ICD-10-CM | POA: Diagnosis not present

## 2017-07-19 DIAGNOSIS — I6523 Occlusion and stenosis of bilateral carotid arteries: Secondary | ICD-10-CM | POA: Diagnosis not present

## 2017-07-19 DIAGNOSIS — I5032 Chronic diastolic (congestive) heart failure: Secondary | ICD-10-CM | POA: Diagnosis not present

## 2017-07-24 DIAGNOSIS — E785 Hyperlipidemia, unspecified: Secondary | ICD-10-CM | POA: Diagnosis not present

## 2017-07-24 DIAGNOSIS — I10 Essential (primary) hypertension: Secondary | ICD-10-CM | POA: Diagnosis not present

## 2017-07-24 DIAGNOSIS — E119 Type 2 diabetes mellitus without complications: Secondary | ICD-10-CM | POA: Diagnosis not present

## 2017-07-24 DIAGNOSIS — R634 Abnormal weight loss: Secondary | ICD-10-CM | POA: Diagnosis not present

## 2017-07-26 ENCOUNTER — Other Ambulatory Visit: Payer: Self-pay | Admitting: Internal Medicine

## 2017-07-26 DIAGNOSIS — Z139 Encounter for screening, unspecified: Secondary | ICD-10-CM

## 2017-07-27 DIAGNOSIS — J441 Chronic obstructive pulmonary disease with (acute) exacerbation: Secondary | ICD-10-CM | POA: Diagnosis not present

## 2017-07-27 DIAGNOSIS — R2689 Other abnormalities of gait and mobility: Secondary | ICD-10-CM | POA: Diagnosis not present

## 2017-07-27 DIAGNOSIS — M6281 Muscle weakness (generalized): Secondary | ICD-10-CM | POA: Diagnosis not present

## 2017-07-27 DIAGNOSIS — M6282 Rhabdomyolysis: Secondary | ICD-10-CM | POA: Diagnosis not present

## 2017-08-25 ENCOUNTER — Ambulatory Visit: Payer: Self-pay

## 2017-08-26 DIAGNOSIS — J441 Chronic obstructive pulmonary disease with (acute) exacerbation: Secondary | ICD-10-CM | POA: Diagnosis not present

## 2017-08-26 DIAGNOSIS — R2689 Other abnormalities of gait and mobility: Secondary | ICD-10-CM | POA: Diagnosis not present

## 2017-08-26 DIAGNOSIS — M6281 Muscle weakness (generalized): Secondary | ICD-10-CM | POA: Diagnosis not present

## 2017-08-26 DIAGNOSIS — M6282 Rhabdomyolysis: Secondary | ICD-10-CM | POA: Diagnosis not present

## 2017-09-04 DIAGNOSIS — R634 Abnormal weight loss: Secondary | ICD-10-CM | POA: Diagnosis not present

## 2017-09-06 DIAGNOSIS — I6523 Occlusion and stenosis of bilateral carotid arteries: Secondary | ICD-10-CM | POA: Diagnosis not present

## 2017-09-07 DIAGNOSIS — J449 Chronic obstructive pulmonary disease, unspecified: Secondary | ICD-10-CM | POA: Diagnosis not present

## 2017-09-07 DIAGNOSIS — I10 Essential (primary) hypertension: Secondary | ICD-10-CM | POA: Diagnosis not present

## 2017-09-07 DIAGNOSIS — E785 Hyperlipidemia, unspecified: Secondary | ICD-10-CM | POA: Diagnosis not present

## 2017-09-07 DIAGNOSIS — E119 Type 2 diabetes mellitus without complications: Secondary | ICD-10-CM | POA: Diagnosis not present

## 2017-09-13 DIAGNOSIS — I6523 Occlusion and stenosis of bilateral carotid arteries: Secondary | ICD-10-CM | POA: Diagnosis not present

## 2017-09-13 DIAGNOSIS — J432 Centrilobular emphysema: Secondary | ICD-10-CM | POA: Diagnosis not present

## 2017-09-13 DIAGNOSIS — I5032 Chronic diastolic (congestive) heart failure: Secondary | ICD-10-CM | POA: Diagnosis not present

## 2017-09-13 DIAGNOSIS — I10 Essential (primary) hypertension: Secondary | ICD-10-CM | POA: Diagnosis not present

## 2017-09-22 ENCOUNTER — Encounter: Payer: Self-pay | Admitting: Podiatry

## 2017-09-22 ENCOUNTER — Ambulatory Visit (INDEPENDENT_AMBULATORY_CARE_PROVIDER_SITE_OTHER): Payer: Medicare Other | Admitting: Podiatry

## 2017-09-22 DIAGNOSIS — E119 Type 2 diabetes mellitus without complications: Secondary | ICD-10-CM

## 2017-09-22 DIAGNOSIS — M79676 Pain in unspecified toe(s): Secondary | ICD-10-CM | POA: Diagnosis not present

## 2017-09-22 DIAGNOSIS — B351 Tinea unguium: Secondary | ICD-10-CM

## 2017-09-22 NOTE — Progress Notes (Signed)
Patient ID: Beth Kerr, female   DOB: June 30, 1941, [age] y.o.   MRN: [id number] Complaint:  Visit Type: Patient returns to my office for continued preventative foot care services. Complaint: Patient states" my nails have grown long and thick and become painful to walk and wear shoes" Patient has been diagnosed with DM with no foot complications. The patient presents for preventative foot care services. No changes to ROS  Podiatric Exam: Vascular: dorsalis pedis and posterior tibial pulses are palpable bilateral. Capillary return is immediate. Temperature gradient is WNL. Skin turgor WNL  Sensorium: Normal Semmes Weinstein monofilament test. Normal tactile sensation bilaterally. Nail Exam: Pt has thick disfigured discolored nails with subungual debris noted bilateral entire nail hallux through fifth toenails Ulcer Exam: There is no evidence of ulcer or pre-ulcerative changes or infection. Orthopedic Exam: Muscle tone and strength are WNL. No limitations in general ROM. No crepitus or effusions noted. Foot type and digits show no abnormalities. Bony prominences are unremarkable. Skin: No Porokeratosis. No infection or ulcers  Diagnosis:  Onychomycosis, , Pain in right toe, pain in left toes  Treatment & Plan Procedures and Treatment: Consent by patient was obtained for treatment procedures. The patient understood the discussion of treatment and procedures well. All questions were answered thoroughly reviewed. Debridement of mycotic and hypertrophic toenails, 1 through 5 bilateral and clearing of subungual debris. No ulceration, no infection noted.  Return Visit-Office Procedure: Patient instructed to return to the office for a follow up visit 3 months for continued evaluation and treatment.   Beth Kerr DPM

## 2017-09-26 DIAGNOSIS — M6282 Rhabdomyolysis: Secondary | ICD-10-CM | POA: Diagnosis not present

## 2017-09-26 DIAGNOSIS — J441 Chronic obstructive pulmonary disease with (acute) exacerbation: Secondary | ICD-10-CM | POA: Diagnosis not present

## 2017-09-26 DIAGNOSIS — M6281 Muscle weakness (generalized): Secondary | ICD-10-CM | POA: Diagnosis not present

## 2017-09-26 DIAGNOSIS — R2689 Other abnormalities of gait and mobility: Secondary | ICD-10-CM | POA: Diagnosis not present

## 2017-10-26 DIAGNOSIS — R2689 Other abnormalities of gait and mobility: Secondary | ICD-10-CM | POA: Diagnosis not present

## 2017-10-26 DIAGNOSIS — M6282 Rhabdomyolysis: Secondary | ICD-10-CM | POA: Diagnosis not present

## 2017-10-26 DIAGNOSIS — M6281 Muscle weakness (generalized): Secondary | ICD-10-CM | POA: Diagnosis not present

## 2017-10-26 DIAGNOSIS — J441 Chronic obstructive pulmonary disease with (acute) exacerbation: Secondary | ICD-10-CM | POA: Diagnosis not present

## 2017-11-17 ENCOUNTER — Ambulatory Visit (INDEPENDENT_AMBULATORY_CARE_PROVIDER_SITE_OTHER): Payer: Medicare Other | Admitting: Internal Medicine

## 2017-11-17 ENCOUNTER — Encounter: Payer: Self-pay | Admitting: Internal Medicine

## 2017-11-17 VITALS — BP 120/60 | HR 80 | Ht 62.0 in | Wt 123.2 lb

## 2017-11-17 DIAGNOSIS — J449 Chronic obstructive pulmonary disease, unspecified: Secondary | ICD-10-CM

## 2017-11-17 MED ORDER — TIOTROPIUM BROMIDE-OLODATEROL 2.5-2.5 MCG/ACT IN AERS: 2.0000 | INHALATION_SPRAY | Freq: Every day | RESPIRATORY_TRACT | 0 refills | Status: DC

## 2017-11-17 NOTE — Progress Notes (Signed)
Subjective:    Patient ID: Beth Kerr, female    DOB: [birth year], 77 y.o.   MRN: [id number] PCP [name], MD  HPI    Beth Kerr 11/17/2017  Chief Complaint  Patient presents with  . Consult    Self referral for COPD.  Pt diagnosed with COPD 01/2017. States she fell and after 5 days of laying in the floor after fall, was taken to the hospital 01/27/18 and after 5 days in hosp. was taken to rehab x3 weeks. Pt denies any real complaints of cough or SOB. Has some midsternal pain that has been happening x3-4 weeks. Pt usually wears O2 24/7 2-4L.   [facility] 77 [facility]. with [address] [place] [address] self referred for copd.  History is gained from talking to her grandson, talking to her and review of the chart.  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  She was admitted for hypercapnic respiratory failure and intubated.  She is about that admission and then discharged.  Prior to that she only had a diagnosis of COPD based on pulmonary function test but she was not taking any inhalers.  After that discharge she went on oxygen for the first time and has convalesced at home and according to her and her grandson doing fairly well.  She finds the use of oxygen very inconvenient and often does not like to use it.  She does several activities of daily living including cooking and changing beds and changing her clothes.  She does this without oxygen.  She is not on any maintenance inhalers.  She followed up with her primary care physician and was asked to come in to reestablish with pulmonary.  Although she has never seen pulmonary in the office she is only seen [name] in the critical care side.  She might have to take inhalers despite she is surprised that feeling subjectively well.  She is surprised that she still needs to be on oxygen.  Today on room air at rest after being off oxygen for 1 hour her pulse ox was 90% but she desaturated at 30 yard mark according to my  medical assistant.  Currently overall she feels fine.  COPD CAT score is 15 and reflects a baseline. Walking desaturation tst - 185 feet x 3 laps opn RA - at half lap desaturated to 86% and stayed good at 2L [place] (pulse ox 90% roomn air at rest 90%)   CAT COPD Symptom & Quality of Life Score (GSK trademark) 0 is no burden. 5 is highest burden 11/17/2017   Never Cough -> Cough all the time 1  No phlegm in chest -> Chest is full of phlegm 0  No chest tightness -> Chest feels very tight 0  No dyspnea for 1 flight stairs/hill -> Very dyspneic for 1 flight of stairs 3  No limitations for ADL at home -> Very limited with ADL at home 3  Confident leaving home -> Not at all confident leaving home 5  Sleep soundly -> Do not sleep soundly because of lung condition 0  Lots of Energy -> No energy at all 3  TOTAL Score (max 40)  15       Results for Beth Kerr (MRN [id number]) as of 11/17/2017 16:49  Ref. Range 06/30/2016 10:42  FEV1-Post Latest Units: L 0.81  FEV1-%Pred-Post Latest Units: % 45  FEV1-%Change-Post Latest Units: % 5  Post FEV1/FVC ratio Latest Units: %  44  Results for Beth Kerr (MRN [id number]) as of 11/17/2017 16:49  Ref. Range 06/30/2016 10:42  DLCO unc Latest Units: ml/min/mmHg 12.64  DLCO unc % pred Latest Units: % 62   I  MPRESSION CT chest: 1. Negative for acute pulmonary embolus. 2. Small focal consolidation in the lingula may reflect atelectasis or small infiltrate. There are trace effusions 3. Mild emphysema 4. Indeterminate 13 mm left adrenal gland nodule partially visualized. Diffuse nodularity of the right adrenal gland. Further evaluation with adrenal CT is suggested.   Electronically Signed   By: Beth Kerr M.D.   On: 01/28/2017 03:38  Results for Beth Kerr (MRN [id number]) as of 11/17/2017 16:49  Ref. Range 01/28/2017 05:45 01/28/2017 10:40 01/28/2017 12:44 01/28/2017 17:47 01/29/2017 04:10  pH, Arterial Latest Ref Range: 7.350 - 7.450  7.205 (L)  7.152 (LL) 7.270 (L) 7.335 (L) 7.366  pCO2 arterial Latest Ref Range: 32.0 - 48.0 mmHg 82.9 (HH) 91.5 (HH) 65.4 (HH) 55.8 (H) 48.2 (H)  pO2, Arterial Latest Ref Range: 83.0 - 108.0 mmHg 397 (H) 275 (H) 96.1 83.7 123 (H)   ECHO June 2018 - gr 2 diastolic dysf   has a past medical history of Diabetes mellitus without complication (HCC), Emphysema/COPD (HCC), Hyperlipidemia, and Hypertension.   reports that she quit smoking about 21 years ago. Her smoking use included cigarettes. She has a 56.00 pack-year smoking history. She has never used smokeless tobacco.  Past Surgical History:  Procedure Laterality Date  . DILATION AND CURETTAGE OF UTERUS    . TONSILLECTOMY      Allergies  Allergen Reactions  . Demerol [Meperidine] Anaphylaxis    Immunization History  Administered Date(s) Administered  . Influenza, High Dose Seasonal PF 04/25/2017    Family History  Problem Relation Age of Onset  . CAD Father   . Lung cancer Other   . Diabetes Neg Hx   . Stroke Neg Hx      Current Outpatient Medications:  .  aspirin EC 81 MG tablet, Take 81 mg by mouth every morning. , Disp: , Rfl:  .  CRESTOR 10 MG tablet, Take 10 mg by mouth every evening. , Disp: , Rfl: 3 .  furosemide (LASIX) 40 MG tablet, Take 1 tablet (40 mg total) by mouth daily., Disp: 30 tablet, Rfl: 0 .  ibuprofen (ADVIL,MOTRIN) 400 MG tablet, Take 1 tablet (400 mg total) by mouth every 6 (six) hours as needed for mild pain or moderate pain., Disp: 30 tablet, Rfl: 0 .  metFORMIN (GLUCOPHAGE) 1000 MG tablet, Take 1,000 mg by mouth 2 (two) times daily with a meal. , Disp: , Rfl: 4 .  benzonatate (TESSALON) 100 MG capsule, TK 1 C PO TID FOR 10 DAYS PRN, Disp: , Rfl: 12 .  ipratropium-albuterol (DUONEB) 0.5-2.5 (3) MG/3ML SOLN, Take 3 mLs by nebulization 3 (three) times daily. (Patient not taking: Reported on 11/17/2017), Disp: 360 mL, Rfl: 0 .  pantoprazole (PROTONIX) 40 MG tablet, Take 1 tablet (40 mg total) by mouth daily.  (Patient not taking: Reported on 11/17/2017), Disp: 30 tablet, Rfl: 0 .  traMADol (ULTRAM) 50 MG tablet, Take 50 mg by mouth every 6 (six) hours as needed., Disp: , Rfl:  .  traZODone (DESYREL) 50 MG tablet, TK 1 T PO QD HS, Disp: , Rfl: 9    Review of Systems  Constitutional: Negative for fever and unexpected weight change.  HENT: Positive for nosebleeds, postnasal drip and sneezing. Negative for congestion, dental problem, ear pain,  rhinorrhea, sinus pressure, sore throat and trouble swallowing.   Eyes: Negative for redness and itching.  Respiratory: Positive for cough. Negative for chest tightness, shortness of breath and wheezing.   Cardiovascular: Negative for palpitations and leg swelling.  Gastrointestinal: Negative for nausea and vomiting.  Genitourinary: Negative for dysuria.  Musculoskeletal: Negative for joint swelling.  Skin: Negative for rash.  Allergic/Immunologic: Negative.  Negative for environmental allergies, food allergies and immunocompromised state.  Neurological: Negative for headaches.  Hematological: Bruises/bleeds easily.  Psychiatric/Behavioral: Negative for dysphoric mood. The patient is not nervous/anxious.        Objective:   Physical Exam  Vitals:   11/17/17 1629  BP: 120/60  Pulse: 80  SpO2: 93%  Weight: 123 lb 3.2 oz (55.9 kg)  Height: 5\' 2"  (1.575 m)     Estimated body mass index is 22.53 kg/m as calculated from the following:   Height as of this encounter: 5\' 2"  (1.575 m).   Weight as of this encounter: 123 lb 3.2 oz (55.9 kg).   General Appearance:    Looks eldderly frail., Pleasant. Has walker  Head:    Normocephalic, without obvious abnormality, atraumatic  Eyes:    PERRL - yes conjunctiva/corneas - clear      Ears:    Normal external ear canals, both ears  Nose:   NG tube - no  Throat:  ETT TUBE - no , OG tube - no  Neck:   Supple,  No enlargement/tenderness/nodules     Lungs:     Clear to auscultation bilaterally, Some basal  crackles  Chest wall:    No deformity  Heart:    S1 and S2 normal, no murmur, CVP - no.  Pressors - no  Abdomen:     Soft, no masses, no organomegaly  Genitalia:    Not done  Rectal:   not done  Extremities:   Extremities- intact , no edema     Skin:   Intact in exposed areas and dry     Neurologic:   Sedation - none -> RASS - na . Moves all 4s - yes. CAM-ICU - neg . Orientation - x 3 +           Assessment & Plan:     ICD-10-CM   1. Stage 3 severe COPD by GOLD classification (HCC) J44.9      You have severe copd AT rest sitting your oxygen levels are fine But if you walked 90 feet (30 yards) you drop your oxyygen  Plan - you need to be on maintenbance copd Rx regardless of you feel  - start inhaler stiolto daily  - use albuterol as needed - wear 2LNC at night -- at rest sitting you do not need o2 - but with walking > 50-90 feet ( > 20-30 yards ) put  [facility]  On  Followup  - 4-8 weeks with APP to report progerss   > 50% of this > 25 min visit spent in face to face counseling or coordination of care    Dr. Kalman ShanMurali Tyaisha Cullom, M.D., [facility] Pulmonary and Critical Care Medicine Staff Physician, [facility] Center Director - Interstitial Lung Disease  Program  Pulmonary Fibrosis [facility] at [facility] [place], [place], [id number]  Pager: [phone], If no answer or between  15:00h - 7:00h: call [phone] Telephone: [phone]

## 2017-11-17 NOTE — Patient Instructions (Addendum)
ICD-10-CM   1. 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

## 2017-11-24 DIAGNOSIS — R2689 Other abnormalities of gait and mobility: Secondary | ICD-10-CM | POA: Diagnosis not present

## 2017-11-24 DIAGNOSIS — J441 Chronic obstructive pulmonary disease with (acute) exacerbation: Secondary | ICD-10-CM | POA: Diagnosis not present

## 2017-11-24 DIAGNOSIS — M6281 Muscle weakness (generalized): Secondary | ICD-10-CM | POA: Diagnosis not present

## 2017-11-24 DIAGNOSIS — M6282 Rhabdomyolysis: Secondary | ICD-10-CM | POA: Diagnosis not present

## 2017-12-20 ENCOUNTER — Telehealth: Payer: Self-pay | Admitting: Acute Care

## 2017-12-20 ENCOUNTER — Other Ambulatory Visit (INDEPENDENT_AMBULATORY_CARE_PROVIDER_SITE_OTHER): Payer: Medicare Other

## 2017-12-20 ENCOUNTER — Ambulatory Visit (INDEPENDENT_AMBULATORY_CARE_PROVIDER_SITE_OTHER): Payer: Medicare Other | Admitting: Acute Care

## 2017-12-20 ENCOUNTER — Encounter: Payer: Self-pay | Admitting: Acute Care

## 2017-12-20 VITALS — BP 122/60 | HR 77 | Ht 62.0 in | Wt 131.8 lb

## 2017-12-20 DIAGNOSIS — J449 Chronic obstructive pulmonary disease, unspecified: Secondary | ICD-10-CM

## 2017-12-20 DIAGNOSIS — J9602 Acute respiratory failure with hypercapnia: Secondary | ICD-10-CM

## 2017-12-20 LAB — BASIC METABOLIC PANEL
BUN: 26 mg/dL — ABNORMAL HIGH (ref 6–23)
CALCIUM: 9.4 mg/dL (ref 8.4–10.5)
CO2: 33 mEq/L — ABNORMAL HIGH (ref 19–32)
CREATININE: 1.35 mg/dL — AB (ref 0.40–1.20)
Chloride: 98 mEq/L (ref 96–112)
GFR: 40.26 mL/min — AB (ref >60.00)
Glucose, Bld: 114 mg/dL — ABNORMAL HIGH (ref 70–99)
Potassium: 4.1 mEq/L (ref 3.5–5.1)
Sodium: 140 mEq/L (ref 135–145)

## 2017-12-20 NOTE — Progress Notes (Signed)
History of Present Illness Beth Kerr is a 77 y.o. female former smoker with severe  COPD. She is followed by [name]   12/20/2017  4-8 week follow up after seeing Dr. hemospermia is being started on maintenance therapy.. Pt. Was seen 11/17/2017 by [name]. She was a new self  referral for COPD.  Plan after that  Visit was as follows: Plan per [name] - you need to be on maintenbance copd Rx regardless of you feel             - start inhaler stiolto daily             - use albuterol as needed - wear 2LNC at night -- at rest sitting you do not need o2 - but with walking > 50-90 feet ( > 20-30 yards ) put  [facility]  On  Pt. Presents today stating she is doing well on the [place].She feels this has helped her shortness of breath.. She states she is less short of breath.She is wearing her oxygen at night, and during the day as needed with exertion . She does not have a rescue inhaler.She states she does not feel she needs one at present.  She denies fever, chest pain, orthopnea, or hemoptysis.  We discussed the importance of being proactive whenever she feels she is developing an upper respiratory infection or has the beginnings of worsening dyspnea.   Test Results: PFT's >> 06/2016 Results for Beth Kerr, Beth Kerr (MRN [id number])   Ref. Range 06/30/2016 10:42  FVC-Pre Latest Units: L 1.61  FVC-%Pred-Pre Latest Units: % 67  FEV1-Pre Latest Units: L 0.77  FEV1-%Pred-Pre Latest Units: % 43  Pre FEV1/FVC ratio Latest Units: % 48  FEV1FVC-%Pred-Pre Latest Units: % 64  FEF 25-75 Pre Latest Units: L/sec 0.24  FEF2575-%Pred-Pre Latest Units: % 17  FEV6-Pre Latest Units: L 1.41  FEV6-%Pred-Pre Latest Units: % 62  Pre FEV6/FVC Ratio Latest Units: % 87  FEV6FVC-%Pred-Pre Latest Units: % 91  FVC-Post Latest Units: L 1.84  FVC-%Pred-Post Latest Units: % 77  FVC-%Change-Post Latest Units: % 14  FEV1-Post Latest Units: L 0.81  FEV1-%Pred-Post Latest Units: % 45    FEV1-%Change-Post Latest Units: % 5  Post FEV1/FVC ratio Latest Units: % 44  FEV1FVC-%Change-Post Latest Units: % -8  FEF 25-75 Post Latest Units: L/sec 0.32  FEF2575-%Pred-Post Latest Units: % 22  FEF2575-%Change-Post Latest Units: % 29  FEV6-Post Latest Units: L 1.56  FEV6-%Pred-Post Latest Units: % 69  FEV6-%Change-Post Latest Units: % 11  Post FEV6/FVC ratio Latest Units: % 85  FEV6FVC-%Pred-Post Latest Units: % 89  FEV6FVC-%Change-Post Latest Units: % -2  TLC Latest Units: L 5.43  TLC % pred Latest Units: % 118  RV Latest Units: L 3.38  RV % pred Latest Units: % 155  DLCO unc Latest Units: ml/min/mmHg 12.64  DLCO unc % pred Latest Units: % 62  DL/VA Latest Units: ml/min/mmHg/L 3.57  DL/VA % pred Latest Units: % 81     Conclusions: Although there is severe airway obstruction and a diffusion defect suggesting emphysema, the absence of overinflation indicates a concurrent restrictive process which may account for the diffusion defect. A clinical trial of bronchodilators may be beneficial in view of the airway obstruction. Pulmonary Function Diagnosis: Severe Obstructive Airways Disease Response to bronchodilator Over-inflation and airtrapping Moderate Diffusion Defect  CBC Latest Ref Rng & Units 02/02/2017 02/01/2017 01/31/2017  WBC 4.0 - 10.5 K/uL 7.1 6.6 7.7  Hemoglobin 12.0 - 15.0 g/dL 11.6(L) 10.4(L)  10.3(L)  Hematocrit 36.0 - 46.0 % 38.0 33.8(L) 34.9(L)  Platelets 150 - 400 K/uL 169 152 160    BMP Latest Ref Rng & Units 12/20/2017 02/03/2017 02/02/2017  Glucose 70 - 99 mg/dL 161(W) 960(A) 540(J)  BUN 6 - 23 mg/dL 81(X) 91(Y) 78(G)  Creatinine 0.40 - 1.20 mg/dL 9.56(O) 1.30 8.65  Sodium 135 - 145 mEq/L 140 140 146(H)  Potassium 3.5 - 5.1 mEq/L 4.1 4.1 3.4(L)  Chloride 96 - 112 mEq/L 98 96(L) 100(L)  CO2 19 - 32 mEq/L 33(H) 35(H) 37(H)  Calcium 8.4 - 10.5 mg/dL 9.4 9.0 8.9    BNP No results found for: BNP  ProBNP No results found for: PROBNP  PFT    Component Value  Date/Time   FEV1PRE 0.77 06/30/2016 1042   FEV1POST 0.81 06/30/2016 1042   FVCPRE 1.61 06/30/2016 1042   FVCPOST 1.84 06/30/2016 1042   TLC 5.43 06/30/2016 1042   DLCOUNC 12.64 06/30/2016 1042   PREFEV1FVCRT 48 06/30/2016 1042   PSTFEV1FVCRT 44 06/30/2016 1042    No results found.   Past medical hx Past Medical History:  Diagnosis Date  . Diabetes mellitus without complication (HCC)   . Emphysema/COPD (HCC)   . Hyperlipidemia   . Hypertension      Social History   Tobacco Use  . Smoking status: Former Smoker    Packs/day: 2.00    Years: 28.00    Pack years: 56.00    Types: Cigarettes    Last attempt to quit: 12/1995    Years since quitting: 21.9  . Smokeless tobacco: Never Used  Substance Use Topics  . Alcohol use: No  . Drug use: No    Ms.Deman reports that she quit smoking about 21 years ago. Her smoking use included cigarettes. She has a 56.00 pack-year smoking history. She has never used smokeless tobacco. She reports that she does not drink alcohol or use drugs.  Tobacco Cessation: Former smoker quit 1997 with a 56-pack-year smoking history  Past surgical hx, Family hx, Social hx all reviewed.  Current Outpatient Medications on File Prior to Visit  Medication Sig  . aspirin EC 81 MG tablet Take 81 mg by mouth every morning.   . benzonatate (TESSALON) 100 MG capsule TK 1 C PO TID FOR 10 DAYS PRN  . CRESTOR 10 MG tablet Take 10 mg by mouth every evening.   . furosemide (LASIX) 40 MG tablet Take 1 tablet (40 mg total) by mouth daily.  [name] ibuprofen (ADVIL,MOTRIN) 400 MG tablet Take 1 tablet (400 mg total) by mouth every 6 (six) hours as needed for mild pain or moderate pain.  [name] ipratropium-albuterol (DUONEB) 0.5-2.5 (3) MG/3ML SOLN Take 3 mLs by nebulization 3 (three) times daily.  . metFORMIN (GLUCOPHAGE) 1000 MG tablet Take 1,000 mg by mouth 2 (two) times daily with a meal.   . pantoprazole (PROTONIX) 40 MG tablet Take 1 tablet (40 mg total) by mouth daily.    . Tiotropium Bromide-Olodaterol (STIOLTO RESPIMAT) 2.5-2.5 MCG/ACT AERS Inhale 2 puffs into the lungs daily.  . traMADol (ULTRAM) 50 MG tablet Take 50 mg by mouth every 6 (six) hours as needed.  . traZODone (DESYREL) 50 MG tablet TK 1 T PO QD HS   No current facility-administered medications on file prior to visit.      Allergies  Allergen Reactions  . Demerol [Meperidine] Anaphylaxis    Review Of Systems:  Constitutional:   No  weight loss, night sweats,  Fevers, chills, fatigue, or  lassitude.  HEENT:   No headaches,  Difficulty swallowing,  Tooth/dental problems, or  Sore throat,                No sneezing, itching, ear ache, nasal congestion, post nasal drip,   CV:  No chest pain,  Orthopnea, PND, swelling in lower extremities, anasarca, dizziness, palpitations, syncope.   GI  No heartburn, indigestion, abdominal pain, nausea, vomiting, diarrhea, change in bowel habits, loss of appetite, bloody stools.   Resp: + shortness of breath with exertion less at rest.  No excess mucus, no productive cough,  No non-productive cough,  No coughing up of blood.  No change in color of mucus.  No wheezing.  No chest wall deformity  Skin: no rash or lesions.  GU: no dysuria, change in color of urine, no urgency or frequency.  No flank pain, no hematuria   MS:  No joint pain or swelling.  No decreased range of motion.  No back pain.  Psych:  No change in mood or affect. No depression or anxiety.  No memory loss.   Vital Signs BP 122/60 (BP Location: Left Arm, Cuff Size: Normal)   Pulse 77   Ht 5\' 2"  (1.575 m)   Wt 131 lb 12.8 oz (59.8 kg)   SpO2 94%   BMI 24.11 kg/m    Physical Exam:  General- No distress,  A&Ox3, frail elderly female using a walker. ENT: No sinus tenderness, TM clear, pale nasal mucosa, no oral exudate,no post nasal drip, no LAN Cardiac: S1, S2, regular rate and rhythm, no murmur Chest: No wheeze/ rales/ dullness; no accessory muscle use, no nasal flaring, no  sternal retractions , prolonged expiratory phase Abd.: Soft Non-tender, nondistended, flat Ext: No clubbing cyanosis, edema Neuro: Deconditioned at baseline, moving all extremities x4, alert and oriented x3 Skin: No rashes, warm and dry Psych: normal mood and behavior   Assessment/Plan  COPD without exacerbation [facility](HCC) Patient states she feels better after starting Stiolto Plan We will send in a prescription for Stialto>> We will check for a sample Continue Stialto 2 puffs once daily. Rinse mouth after use. BMET today Let us know if you would like a rescue inhaler. In the meantime, use the neb treatments as rescue as needed. Continue wearing oxygen at  [facility]2LNC at night -- at rest sitting you do not need O2 - but with walking > 50-90 feet ( > 20-30 yards ) put  [facility]2LNC  On Saturation goals are 88-92% Follow up in 4 months with Dr. Marchelle Gearingamaswamy or Maralyn SagoSarah NP. Please contact office for sooner follow up if symptoms do not improve or worsen or seek emergency care  Note your daily symptoms > remember "red flags" for COPD:  Increase in cough, increase in sputum production, increase in shortness of breath or activity tolerance. If you notice these symptoms, please call to be seen.  Acute hypercapnic respiratory failure (HCC) Continue wearing oxygen at  [facility]2LNC at night -- at rest sitting you do not need O2 - but with walking > 50-90 feet ( > 20-30 yards ) put  [facility]2LNC  On Saturation goals are 88-92%     Bevelyn NgoSarah F Sarahbeth Cashin, NP 12/20/2017  9:21 PM

## 2017-12-20 NOTE — Patient Instructions (Addendum)
It is nice to meet you today. We will send in a prescription for Stialto>> We will check for a sample Continue Stialto 2 puffs once daily. Rinse mouth after use. BMET today Let us know if you would like a rescue inhaler. In the meantime, use the neb treatments as rescue as needed. Continue wearing oxygen at  [facility]2LNC at night -- at rest sitting you do not need O2 - but with walking > 50-90 feet ( > 20-30 yards ) put  [facility]2LNC  On Saturation goals are 88-92% Follow up in 4 months with Dr. Marchelle Gearingamaswamy or Maralyn SagoSarah NP. Please contact office for sooner follow up if symptoms do not improve or worsen or seek emergency care  Note your daily symptoms > remember "red flags" for COPD:  Increase in cough, increase in sputum production, increase in shortness of breath or activity tolerance. If you notice these symptoms, please call to be seen.

## 2017-12-20 NOTE — Assessment & Plan Note (Signed)
Continue wearing oxygen at  [facility]2LNC at night -- at rest sitting you do not need O2 - but with walking > 50-90 feet ( > 20-30 yards ) put  [facility]2LNC  On Saturation goals are 88-92%

## 2017-12-20 NOTE — Telephone Encounter (Signed)
Please call patient and let her know her CO2 level and her lab work was somewhat improved over the previous labs drawn.  Have a follow-up plan of care developed in the office and December 20, 2017 and remind her to call us if she has any further needs, or a flare of her COPD.  Thank you

## 2017-12-20 NOTE — Assessment & Plan Note (Signed)
Patient states she feels better after starting Stiolto Plan We will send in a prescription for Stialto>> We will check for a sample Continue Stialto 2 puffs once daily. Rinse mouth after use. BMET today Let us know if you would like a rescue inhaler. In the meantime, use the neb treatments as rescue as needed. Continue wearing oxygen at  [facility]2LNC at night -- at rest sitting you do not need O2 - but with walking > 50-90 feet ( > 20-30 yards ) put  [facility]2LNC  On Saturation goals are 88-92% Follow up in 4 months with Dr. Marchelle Gearingamaswamy or Maralyn SagoSarah NP. Please contact office for sooner follow up if symptoms do not improve or worsen or seek emergency care  Note your daily symptoms > remember "red flags" for COPD:  Increase in cough, increase in sputum production, increase in shortness of breath or activity tolerance. If you notice these symptoms, please call to be seen.

## 2017-12-21 NOTE — Telephone Encounter (Signed)
Called spoke with patient, advised of lab results/recs as stated by SG Pt voiced her understanding and denied any questions/concerns Nothing further needed; will sign off   Per 4.24.19 office visit with SG: Patient Instructions  It is nice to meet you today. We will send in a prescription for Stialto>> We will check for a sample Continue Stialto 2 puffs once daily. Rinse mouth after use. BMET today Let us know if you would like a rescue inhaler. In the meantime, use the neb treatments as rescue as needed. Continue wearing oxygen at  [facility]2LNC at night -- at rest sitting you do not need O2 - but with walking > 50-90 feet ( > 20-30 yards ) put  [facility]2LNC  On Saturation goals are 88-92% Follow up in 4 months with Dr. Marchelle Gearingamaswamy or Maralyn SagoSarah NP. Please contact office for sooner follow up if symptoms do not improve or worsen or seek emergency care  Note your daily symptoms > remember "red flags" for COPD:  Increase in cough, increase in sputum production, increase in shortness of breath or activity tolerance. If you notice these symptoms, please call to be seen.

## 2017-12-25 DIAGNOSIS — R2689 Other abnormalities of gait and mobility: Secondary | ICD-10-CM | POA: Diagnosis not present

## 2017-12-25 DIAGNOSIS — J441 Chronic obstructive pulmonary disease with (acute) exacerbation: Secondary | ICD-10-CM | POA: Diagnosis not present

## 2017-12-25 DIAGNOSIS — M6281 Muscle weakness (generalized): Secondary | ICD-10-CM | POA: Diagnosis not present

## 2017-12-25 DIAGNOSIS — M6282 Rhabdomyolysis: Secondary | ICD-10-CM | POA: Diagnosis not present

## 2017-12-26 ENCOUNTER — Encounter: Payer: Self-pay | Admitting: Podiatry

## 2017-12-26 ENCOUNTER — Ambulatory Visit (INDEPENDENT_AMBULATORY_CARE_PROVIDER_SITE_OTHER): Payer: Medicare Other | Admitting: Podiatry

## 2017-12-26 DIAGNOSIS — B351 Tinea unguium: Secondary | ICD-10-CM

## 2017-12-26 DIAGNOSIS — M79676 Pain in unspecified toe(s): Secondary | ICD-10-CM

## 2017-12-26 DIAGNOSIS — E119 Type 2 diabetes mellitus without complications: Secondary | ICD-10-CM | POA: Diagnosis not present

## 2017-12-26 NOTE — Progress Notes (Signed)
Patient ID: Beth Kerr, female   DOB: 09-25-1940, 77 y.o.   MRN: [id number] Complaint:  Visit Type: Patient returns to my office for continued preventative foot care services. Complaint: Patient states" my nails have grown long and thick and become painful to walk and wear shoes" Patient has been diagnosed with DM with no foot complications. The patient presents for preventative foot care services. No changes to ROS  Podiatric Exam: Vascular: dorsalis pedis and posterior tibial pulses are palpable bilateral. Capillary return is immediate. Temperature gradient is WNL. Skin turgor WNL  Sensorium: Normal Semmes Weinstein monofilament test. Normal tactile sensation bilaterally. Nail Exam: Pt has thick disfigured discolored nails with subungual debris noted bilateral entire nail hallux through fifth toenails Ulcer Exam: There is no evidence of ulcer or pre-ulcerative changes or infection. Orthopedic Exam: Muscle tone and strength are WNL. No limitations in general ROM. No crepitus or effusions noted. Foot type and digits show no abnormalities. Bony prominences are unremarkable. Skin: No Porokeratosis. No infection or ulcers  Diagnosis:  Onychomycosis, , Pain in right toe, pain in left toes  Treatment & Plan Procedures and Treatment: Consent by patient was obtained for treatment procedures. The patient understood the discussion of treatment and procedures well. All questions were answered thoroughly reviewed. Debridement of mycotic and hypertrophic toenails, 1 through 5 bilateral and clearing of subungual debris. No ulceration, no infection noted.  Return Visit-Office Procedure: Patient instructed to return to the office for a follow up visit 3 months for continued evaluation and treatment.   Helane Gunther DPM

## 2018-02-05 DIAGNOSIS — J449 Chronic obstructive pulmonary disease, unspecified: Secondary | ICD-10-CM | POA: Diagnosis not present

## 2018-02-16 DIAGNOSIS — E119 Type 2 diabetes mellitus without complications: Secondary | ICD-10-CM | POA: Diagnosis not present

## 2018-02-16 DIAGNOSIS — I10 Essential (primary) hypertension: Secondary | ICD-10-CM | POA: Diagnosis not present

## 2018-02-22 DIAGNOSIS — E119 Type 2 diabetes mellitus without complications: Secondary | ICD-10-CM | POA: Diagnosis not present

## 2018-02-22 DIAGNOSIS — R109 Unspecified abdominal pain: Secondary | ICD-10-CM | POA: Diagnosis not present

## 2018-02-22 DIAGNOSIS — R195 Other fecal abnormalities: Secondary | ICD-10-CM | POA: Diagnosis not present

## 2018-02-22 DIAGNOSIS — J449 Chronic obstructive pulmonary disease, unspecified: Secondary | ICD-10-CM | POA: Diagnosis not present

## 2018-02-28 DIAGNOSIS — N189 Chronic kidney disease, unspecified: Secondary | ICD-10-CM | POA: Diagnosis not present

## 2018-02-28 DIAGNOSIS — R54 Age-related physical debility: Secondary | ICD-10-CM | POA: Diagnosis not present

## 2018-02-28 DIAGNOSIS — R109 Unspecified abdominal pain: Secondary | ICD-10-CM | POA: Diagnosis not present

## 2018-02-28 DIAGNOSIS — R4189 Other symptoms and signs involving cognitive functions and awareness: Secondary | ICD-10-CM | POA: Diagnosis not present

## 2018-03-06 DIAGNOSIS — I6523 Occlusion and stenosis of bilateral carotid arteries: Secondary | ICD-10-CM | POA: Diagnosis not present

## 2018-03-07 DIAGNOSIS — J449 Chronic obstructive pulmonary disease, unspecified: Secondary | ICD-10-CM | POA: Diagnosis not present

## 2018-03-12 DIAGNOSIS — E1122 Type 2 diabetes mellitus with diabetic chronic kidney disease: Secondary | ICD-10-CM | POA: Diagnosis not present

## 2018-03-12 DIAGNOSIS — N189 Chronic kidney disease, unspecified: Secondary | ICD-10-CM | POA: Diagnosis not present

## 2018-03-12 DIAGNOSIS — M6281 Muscle weakness (generalized): Secondary | ICD-10-CM | POA: Diagnosis not present

## 2018-03-12 DIAGNOSIS — I130 Hypertensive heart and chronic kidney disease with heart failure and stage 1 through stage 4 chronic kidney disease, or unspecified chronic kidney disease: Secondary | ICD-10-CM | POA: Diagnosis not present

## 2018-03-12 DIAGNOSIS — I5032 Chronic diastolic (congestive) heart failure: Secondary | ICD-10-CM | POA: Diagnosis not present

## 2018-03-12 DIAGNOSIS — J439 Emphysema, unspecified: Secondary | ICD-10-CM | POA: Diagnosis not present

## 2018-03-20 DIAGNOSIS — I5032 Chronic diastolic (congestive) heart failure: Secondary | ICD-10-CM | POA: Diagnosis not present

## 2018-03-20 DIAGNOSIS — I10 Essential (primary) hypertension: Secondary | ICD-10-CM | POA: Diagnosis not present

## 2018-03-20 DIAGNOSIS — J432 Centrilobular emphysema: Secondary | ICD-10-CM | POA: Diagnosis not present

## 2018-03-20 DIAGNOSIS — I6523 Occlusion and stenosis of bilateral carotid arteries: Secondary | ICD-10-CM | POA: Diagnosis not present

## 2018-03-28 ENCOUNTER — Ambulatory Visit: Payer: Medicare Other | Admitting: Podiatry

## 2018-04-03 ENCOUNTER — Ambulatory Visit: Payer: Self-pay | Admitting: Internal Medicine

## 2018-04-07 DIAGNOSIS — J449 Chronic obstructive pulmonary disease, unspecified: Secondary | ICD-10-CM | POA: Diagnosis not present

## 2018-04-11 ENCOUNTER — Ambulatory Visit: Payer: Self-pay | Admitting: Acute Care

## 2018-04-25 ENCOUNTER — Encounter: Payer: Self-pay | Admitting: Acute Care

## 2018-04-25 ENCOUNTER — Ambulatory Visit: Payer: Medicare Other | Admitting: Acute Care

## 2018-04-25 DIAGNOSIS — G4734 Idiopathic sleep related nonobstructive alveolar hypoventilation: Secondary | ICD-10-CM

## 2018-04-25 DIAGNOSIS — J449 Chronic obstructive pulmonary disease, unspecified: Secondary | ICD-10-CM

## 2018-04-25 MED ORDER — ALBUTEROL SULFATE HFA 108 (90 BASE) MCG/ACT IN AERS: 2.0000 | INHALATION_SPRAY | Freq: Four times a day (QID) | RESPIRATORY_TRACT | 5 refills | Status: DC | PRN

## 2018-04-25 MED ORDER — AEROCHAMBER MINI CHAMBER DEVI: 1.0000 | 0 refills | Status: DC

## 2018-04-25 MED ORDER — GLYCOPYRROLATE-FORMOTEROL 9-4.8 MCG/ACT IN AERO: 1.0000 | INHALATION_SPRAY | Freq: Every day | RESPIRATORY_TRACT | 0 refills | Status: DC

## 2018-04-25 NOTE — Patient Instructions (Addendum)
It is nice to meet you today. We will stop Stialto We will start Bevespi  2 puffs twice daily With a spacer. We will teach you how to use the medication with the spacer Make sure you get you flu vaccine next week when you go and see Dr. Selena BattenKim next week. Continue wearing your oxygen at 2 L [place] Saturation goals are 88-92% We will prescribe you a rescue inhaler for break through shortness of breath or wheezing Albuterol rescue inhaler Use up to 3 times daily Use with spacer. Rinse mouth after using inhalers Follow up with Dr. Marchelle Gearingamaswamy in 6 months Note your daily symptoms > remember "red flags" for COPD:  Increase in cough, increase in sputum production, increase in shortness of breath or activity intolerance. If you notice these symptoms, please call to be seen.   Please contact office for sooner follow up if symptoms do not improve or worsen or seek emergency care

## 2018-04-25 NOTE — Assessment & Plan Note (Signed)
Nocturnal Hypoxemia Compliant with oxygen use Plan: Continue wearing oxygen at  [facility]2LNC at night -- at rest sitting you do not need O2 - but with walking >50-90 feet ( >20-30 yards ) put [facility]2LNC On Saturation goals are 88-92%

## 2018-04-25 NOTE — Assessment & Plan Note (Addendum)
Non-compliant with Stialto due to difficulty to use Not using DuNebs Stable interval despite no maintenence Plan We will stop Stialto We will start Bevespi  2 puffs twice daily With a spacer. We will teach you how to use the medication with the spacer Clean spacer with mild soap and water once weekly Make sure you get you flu vaccine next week when you go and see Dr. Selena BattenKim next week. Continue wearing your oxygen at 2 L [place] Saturation goals are 88-92% We will prescribe you a rescue inhaler for break through shortness of breath or wheezing Albuterol rescue inhaler Use up to 3 times daily Use with spacer. Rinse mouth after using inhalers Follow up with Dr. Marchelle Gearingamaswamy in 6 months Note your daily symptoms > remember "red flags" for COPD:  Increase in cough, increase in sputum production, increase in shortness of breath or activity intolerance. If you notice these symptoms, please call to be seen.   Please contact office for sooner follow up if symptoms do not improve or worsen or seek emergency care

## 2018-04-25 NOTE — Progress Notes (Signed)
History of Present Illness Beth Kerr is a 77 y.o. female  former smoker with severe  COPD. She is followed by [name].  Synopsis: Pt. Self referred to [name] after inpatient ICU stay for acute on chronic hypercarbic  respiratory failure requiring intubation summer 2018.She had a diagnosis of COPD but was not on maintenance therapy of oxygen.    04/25/2018  3 Month follow up: Pt. Presents for follow up. She states she is not compliant with her Stialto. It is too hard to use. She has a hard time coordinating it. She iscompliant withher oxygen at 2 L [place] at all times. She states she monitors her sats and they average about 90%.She is not using her Duonebs and she states she has not used them in about 1 year.She states she is not coughing anything up at all. She is taking tessalon perles for cough twice daily. She does not have a rescue inhaler to use for intermittent wheezing. She states she does not have much use for any complicated medications. She denies any fever, chest pain, orthopnea or hemoptysis. She is planning on getting her flu vaccine through her PCP.   Test Results: Results for LAISA, LARRICK (MRN [id number]) as of 11/17/2017 16:49  Ref. Range 06/30/2016 10:42  FEV1-Post Latest Units: L 0.81  FEV1-%Pred-Post Latest Units: % 45  FEV1-%Change-Post Latest Units: % 5  Post FEV1/FVC ratio Latest Units: % 44  Results for MEEKAH, MATH (MRN [id number]) as of 11/17/2017 16:49  Ref. Range 06/30/2016 10:42  DLCO unc Latest Units: ml/min/mmHg 12.64  DLCO unc % pred Latest Units: % 62   MPRESSION CT chest: 01/28/2017 1. Negative for acute pulmonary embolus. 2. Small focal consolidation in the lingula may reflect atelectasis or small infiltrate. There are trace effusions 3. Mild emphysema 4. Indeterminate 13 mm left adrenal gland nodule partially visualized. Diffuse nodularity of the right adrenal gland. Further evaluation with adrenal CT is suggested.  Arterial  Blood Gas Results Results for LINDSEY, DEMONTE (MRN [id number]) as of 11/17/2017 16:49  Ref. Range 01/28/2017 05:45 01/28/2017 10:40 01/28/2017 12:44 01/28/2017 17:47 01/29/2017 04:10  pH, Arterial Latest Ref Range: 7.350 - 7.450  7.205 (L) 7.152 (LL) 7.270 (L) 7.335 (L) 7.366  pCO2 arterial Latest Ref Range: 32.0 - 48.0 mmHg 82.9 (HH) 91.5 (HH) 65.4 (HH) 55.8 (H) 48.2 (H)  pO2, Arterial Latest Ref Range: 83.0 - 108.0 mmHg 397 (H) 275 (H) 96.1 83.7 123 (H)   ECHO June 2018 - gr 2 diastolic dysf     CBC Latest Ref Rng & Units 02/02/2017 02/01/2017 01/31/2017  WBC 4.0 - 10.5 K/uL 7.1 6.6 7.7  Hemoglobin 12.0 - 15.0 g/dL 11.6(L) 10.4(L) 10.3(L)  Hematocrit 36.0 - 46.0 % 38.0 33.8(L) 34.9(L)  Platelets 150 - 400 K/uL 169 152 160    BMP Latest Ref Rng & Units 12/20/2017 02/03/2017 02/02/2017  Glucose 70 - 99 mg/dL 347(Q) 259(D) 638(V)  BUN 6 - 23 mg/dL 56(E) 33(I) 95(J)  Creatinine 0.40 - 1.20 mg/dL 8.84(Z) 6.60 6.30  Sodium 135 - 145 mEq/L 140 140 146(H)  Potassium 3.5 - 5.1 mEq/L 4.1 4.1 3.4(L)  Chloride 96 - 112 mEq/L 98 96(L) 100(L)  CO2 19 - 32 mEq/L 33(H) 35(H) 37(H)  Calcium 8.4 - 10.5 mg/dL 9.4 9.0 8.9    BNP No results found for: BNP  ProBNP No results found for: PROBNP  PFT    Component Value Date/Time   FEV1PRE 0.77 06/30/2016 1042   FEV1POST 0.81 06/30/2016  1042   FVCPRE 1.61 06/30/2016 1042   FVCPOST 1.84 06/30/2016 1042   TLC 5.43 06/30/2016 1042   DLCOUNC 12.64 06/30/2016 1042   PREFEV1FVCRT 48 06/30/2016 1042   PSTFEV1FVCRT 44 06/30/2016 1042    No results found.   Past medical hx Past Medical History:  Diagnosis Date  . Diabetes mellitus without complication (HCC)   . Emphysema/COPD (HCC)   . Hyperlipidemia   . Hypertension      Social History   Tobacco Use  . Smoking status: Former Smoker    Packs/day: 2.00    Years: 28.00    Pack years: 56.00    Types: Cigarettes    Last attempt to quit: 12/1995    Years since quitting: 22.3  . Smokeless tobacco:  Never Used  Substance Use Topics  . Alcohol use: No  . Drug use: No    Ms.Sagan reports that she quit smoking about 22 years ago. Her smoking use included cigarettes. She has a 56.00 pack-year smoking history. She has never used smokeless tobacco. She reports that she does not drink alcohol or use drugs.  Tobacco Cessation: Former smoker quit 1997 with a 56-pack-year smoking history  Past surgical hx, Family hx, Social hx all reviewed.  Current Outpatient Medications on File Prior to Visit  Medication Sig  . benzonatate (TESSALON) 100 MG capsule TK 1 C PO TID FOR 10 DAYS PRN  . donepezil (ARICEPT) 10 MG tablet Take 10 mg by mouth daily.  . furosemide (LASIX) 40 MG tablet Take 1 tablet (40 mg total) by mouth daily.  [name] ipratropium-albuterol (DUONEB) 0.5-2.5 (3) MG/3ML SOLN Take 3 mLs by nebulization 3 (three) times daily. (Patient not taking: Reported on 04/25/2018)  . Tiotropium Bromide-Olodaterol (STIOLTO RESPIMAT) 2.5-2.5 MCG/ACT AERS Inhale 2 puffs into the lungs daily. (Patient not taking: Reported on 04/25/2018)   No current facility-administered medications on file prior to visit.      Allergies  Allergen Reactions  . Demerol [Meperidine] Anaphylaxis    Review Of Systems:  Constitutional:   No  weight loss, night sweats,  Fevers, chills, fatigue, or  lassitude.  HEENT:   No headaches,  Difficulty swallowing,  Tooth/dental problems, or  Sore throat,                No sneezing, itching, ear ache, nasal congestion, post nasal drip,   CV:  No chest pain,  Orthopnea, PND, swelling in lower extremities, anasarca, dizziness, palpitations, syncope.   GI  No heartburn, indigestion, abdominal pain, nausea, vomiting, diarrhea, change in bowel habits, loss of appetite, bloody stools.   Resp: + shortness of breath with exertion or at rest.  No excess mucus, no productive cough,  No non-productive cough,  No coughing up of blood.  No change in color of mucus.  Occasional  wheezing.  No  chest wall deformity  Skin: no rash or lesions.  GU: no dysuria, change in color of urine, no urgency or frequency.  No flank pain, no hematuria   MS:  No joint pain or swelling.  No decreased range of motion.  No back pain.  Psych:  No change in mood or affect. No depression or anxiety.  No memory loss.   Vital Signs BP 122/60 (BP Location: Left Arm, Cuff Size: Normal)   Pulse 90   Ht 5\' 2"  (1.575 m)   Wt 122 lb 12.8 oz (55.7 kg)   SpO2 91%   BMI 22.46 kg/m    Physical Exam:  General- No distress,  A&Ox3, pleasant ENT: No sinus tenderness, TM clear, pale nasal mucosa, no oral exudate,no post nasal drip, no LAN Cardiac: S1, S2, regular rate and rhythm, no murmur Chest: No wheeze/ rales/ dullness; no accessory muscle use, no nasal flaring, no sternal retractions Abd.: Soft Non-tender, ND BS +, Body mass index is 22.46 kg/m. Ext: No clubbing cyanosis, edema Neuro:  Frail, MAE x 4, A&O x 3 Skin: No rashes, warm and dry Psych: normal mood and behavior   Assessment/Plan  Nocturnal hypoxemia Nocturnal Hypoxemia Compliant with oxygen use Plan: Continue wearing oxygen at  [facility]2LNC at night -- at rest sitting you do not need O2 - but with walking >50-90 feet ( >20-30 yards ) put [facility]2LNC On Saturation goals are 88-92%   COPD without exacerbation (HCC) Non-compliant with Stialto due to difficulty to use Not using DuNebs Stable interval despite no maintenence Plan We will stop Stialto We will start Bevespi  2 puffs twice daily With a spacer. We will teach you how to use the medication with the spacer Clean spacer with mild soap and water once weekly Make sure you get you flu vaccine next week when you go and see Dr. Selena BattenKim next week. Continue wearing your oxygen at 2 L [place] Saturation goals are 88-92% We will prescribe you a rescue inhaler for break through shortness of breath or wheezing Albuterol rescue inhaler Use up to 3 times daily Use with spacer. Rinse mouth after  using inhalers Follow up with Dr. Marchelle Gearingamaswamy in 6 months Note your daily symptoms > remember "red flags" for COPD:  Increase in cough, increase in sputum production, increase in shortness of breath or activity intolerance. If you notice these symptoms, please call to be seen.   Please contact office for sooner follow up if symptoms do not improve or worsen or seek emergency care      Bevelyn NgoSarah F Groce, NP 04/25/2018  5:07 PM

## 2018-05-02 DIAGNOSIS — N189 Chronic kidney disease, unspecified: Secondary | ICD-10-CM | POA: Diagnosis not present

## 2018-05-02 DIAGNOSIS — R4189 Other symptoms and signs involving cognitive functions and awareness: Secondary | ICD-10-CM | POA: Diagnosis not present

## 2018-05-02 DIAGNOSIS — G4700 Insomnia, unspecified: Secondary | ICD-10-CM | POA: Diagnosis not present

## 2018-05-08 DIAGNOSIS — J449 Chronic obstructive pulmonary disease, unspecified: Secondary | ICD-10-CM | POA: Diagnosis not present

## 2018-06-07 DIAGNOSIS — J449 Chronic obstructive pulmonary disease, unspecified: Secondary | ICD-10-CM | POA: Diagnosis not present

## 2018-07-08 DIAGNOSIS — J449 Chronic obstructive pulmonary disease, unspecified: Secondary | ICD-10-CM | POA: Diagnosis not present

## 2018-07-17 DIAGNOSIS — H26492 Other secondary cataract, left eye: Secondary | ICD-10-CM | POA: Diagnosis not present

## 2018-07-17 DIAGNOSIS — E119 Type 2 diabetes mellitus without complications: Secondary | ICD-10-CM | POA: Diagnosis not present

## 2018-07-17 DIAGNOSIS — H40023 Open angle with borderline findings, high risk, bilateral: Secondary | ICD-10-CM | POA: Diagnosis not present

## 2018-07-17 DIAGNOSIS — H353131 Nonexudative age-related macular degeneration, bilateral, early dry stage: Secondary | ICD-10-CM | POA: Diagnosis not present

## 2018-07-30 DIAGNOSIS — H04123 Dry eye syndrome of bilateral lacrimal glands: Secondary | ICD-10-CM | POA: Diagnosis not present

## 2018-07-30 DIAGNOSIS — H01009 Unspecified blepharitis unspecified eye, unspecified eyelid: Secondary | ICD-10-CM | POA: Diagnosis not present

## 2018-07-30 DIAGNOSIS — H40023 Open angle with borderline findings, high risk, bilateral: Secondary | ICD-10-CM | POA: Diagnosis not present

## 2018-08-03 IMAGING — CT CT HEAD W/O CM
3 of 4 series · 15 of 47 positions shown, 18 images · non-contrast
Comparison: None.

CLINICAL DATA: Patient fell this past [REDACTED] with questionable loss
of consciousness.

EXAM:
CT HEAD WITHOUT CONTRAST
TECHNIQUE: Contiguous axial images were obtained from the base of the skull
through the vertex without intravenous contrast.

[Series 2: head w/o · axial · non-contrast · 0.45mm/px · z∈[+1864,+1984]mm · 9 of 31 slices shown, 12 images]
[im 4/31  brain]
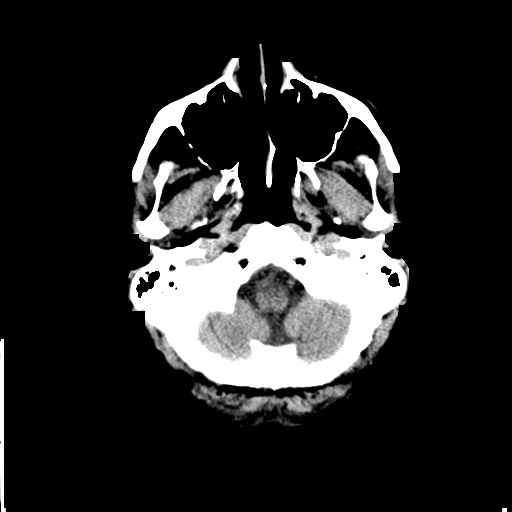
[im 4/31  bone]
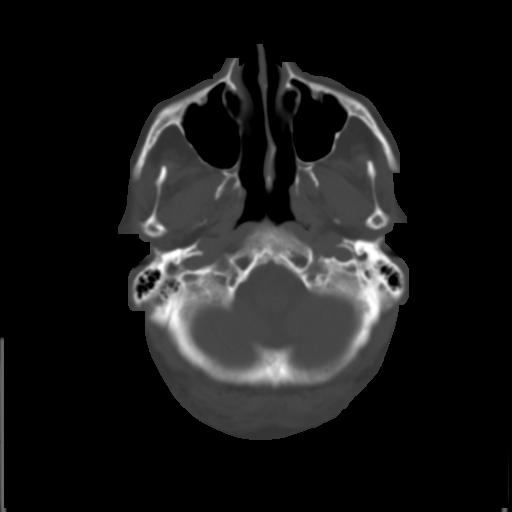
[im 7/31  brain]
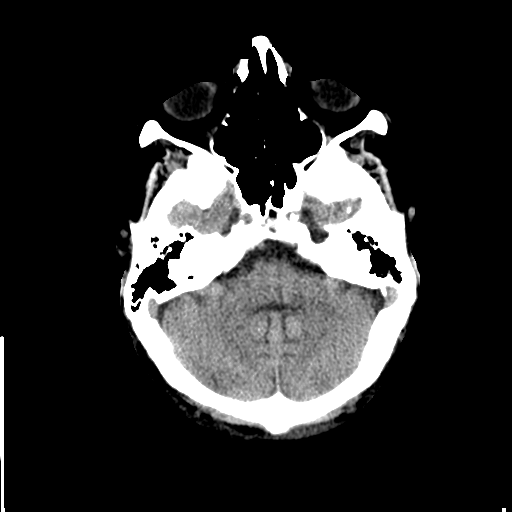
[im 10/31  brain]
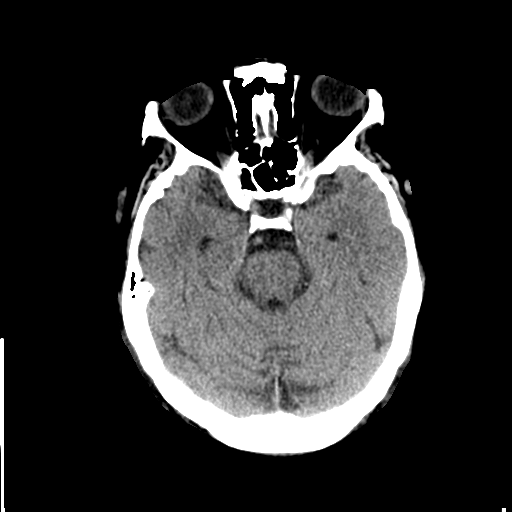
[im 13/31  brain]
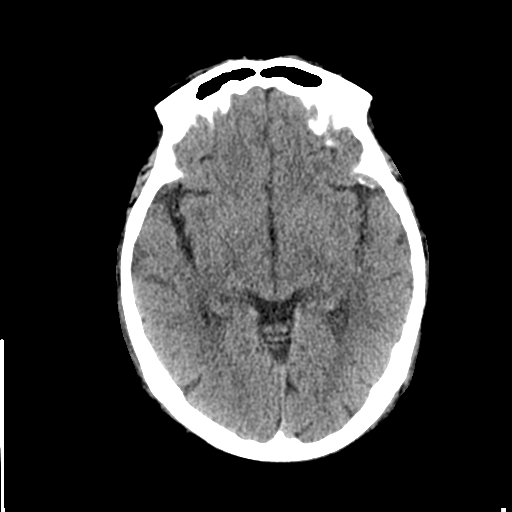
[im 16/31  brain]
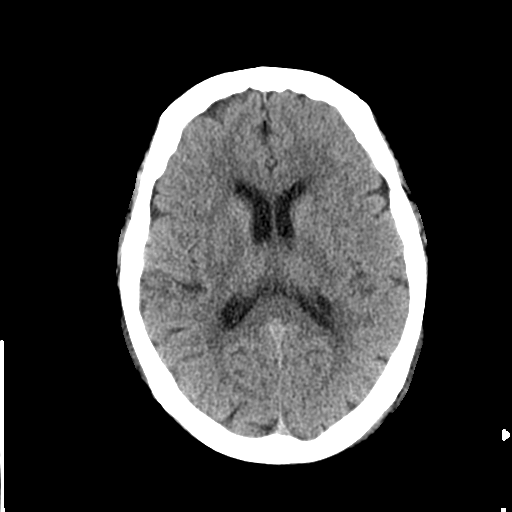
[im 16/31  bone]
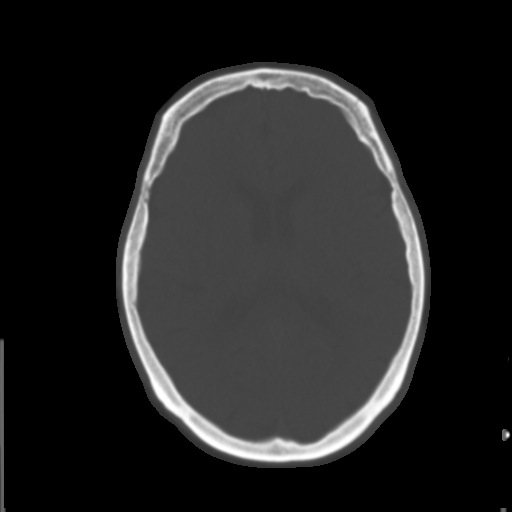
[im 19/31  brain]
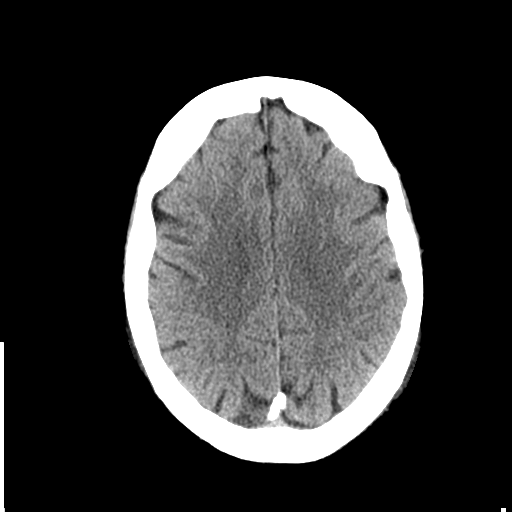
[im 22/31  brain]
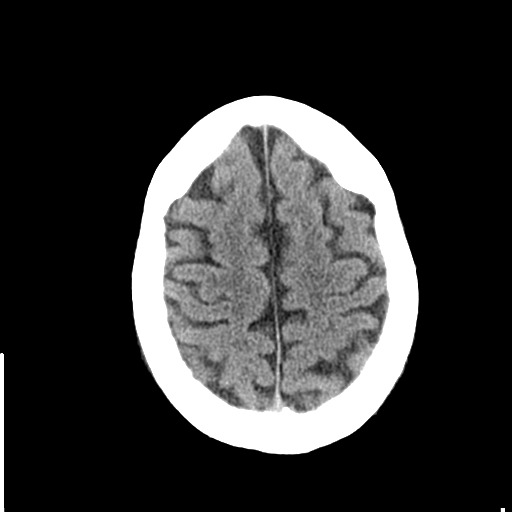
[im 25/31  brain]
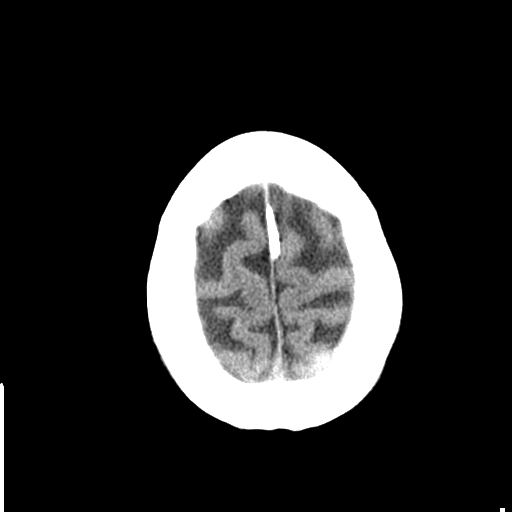
[im 28/31  brain]
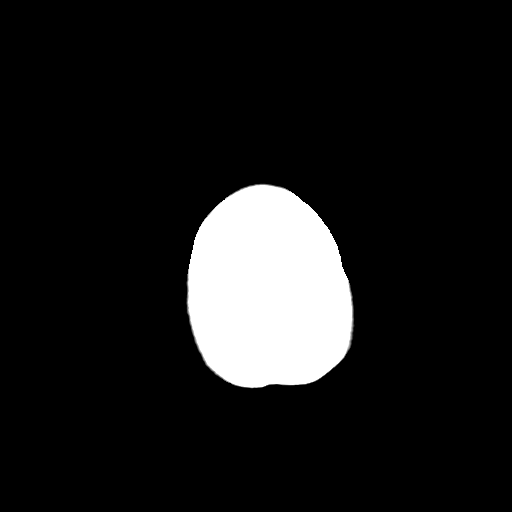
[im 28/31  bone]
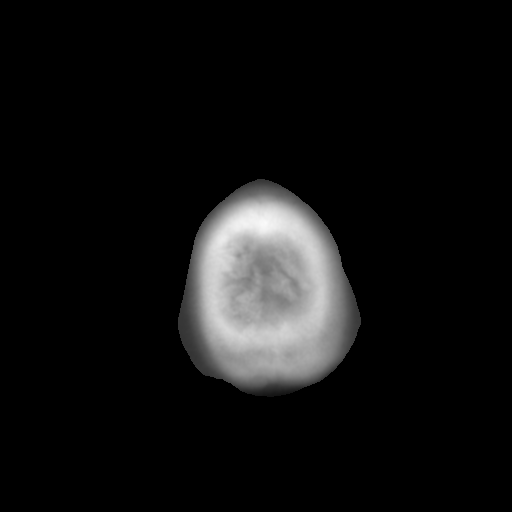

[Series 5: coronal · coronal · 0.30mm/px · 3 of 63 slices shown]
[im 21/63  brain]
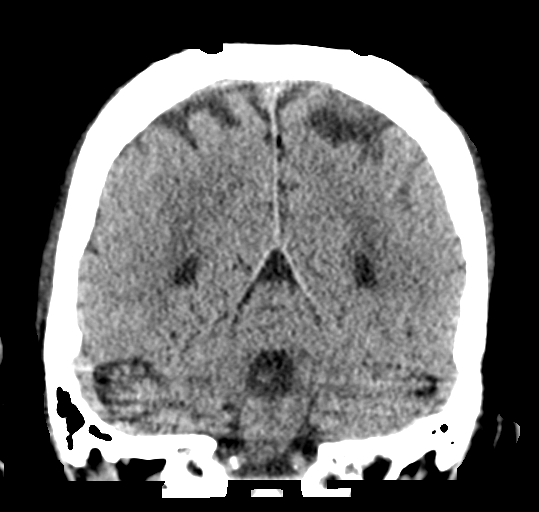
[im 28/63  brain]
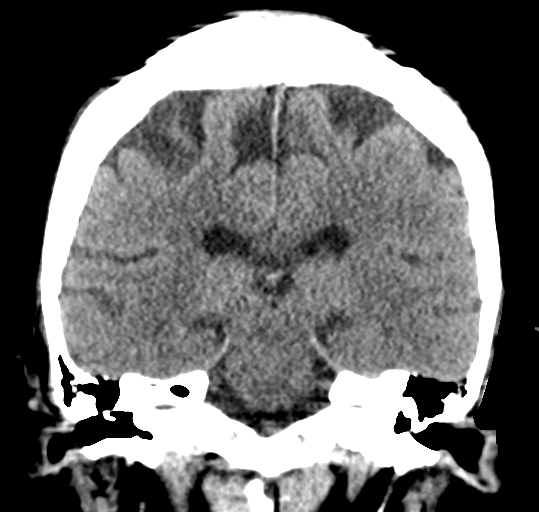
[im 35/63  brain]
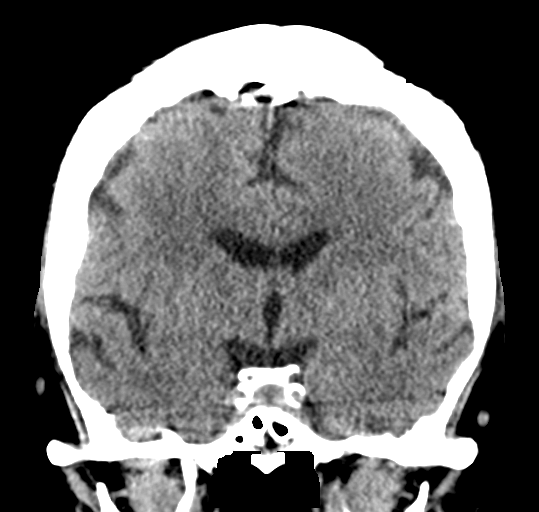

[Series 6: sagittal · sagittal · 0.30mm/px · 3 of 53 slices shown]
[im 18/53  brain]
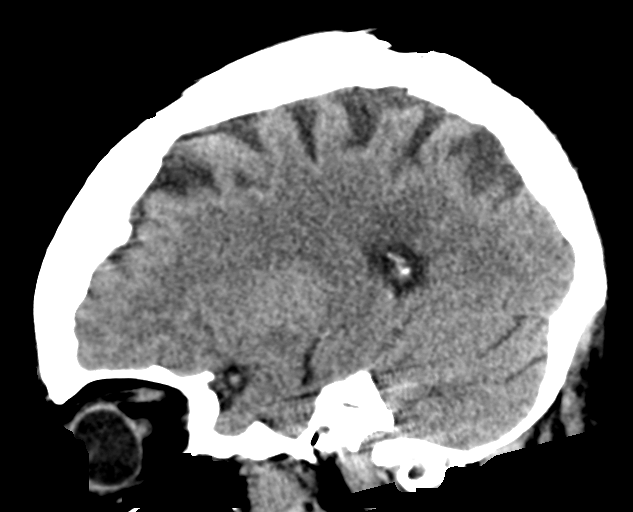
[im 27/53  brain]
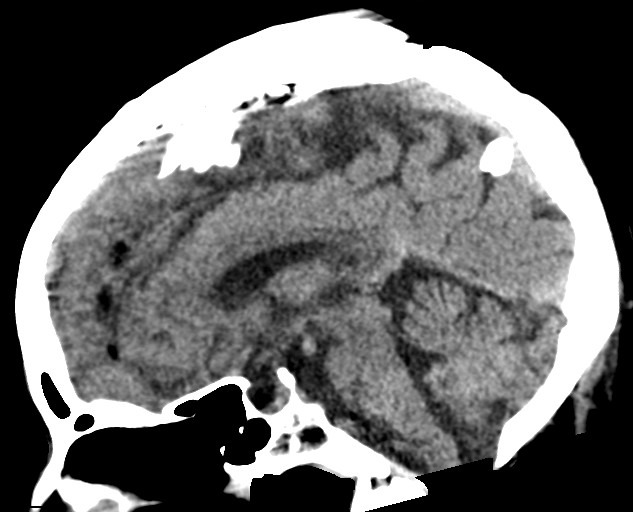
[im 35/53  brain]
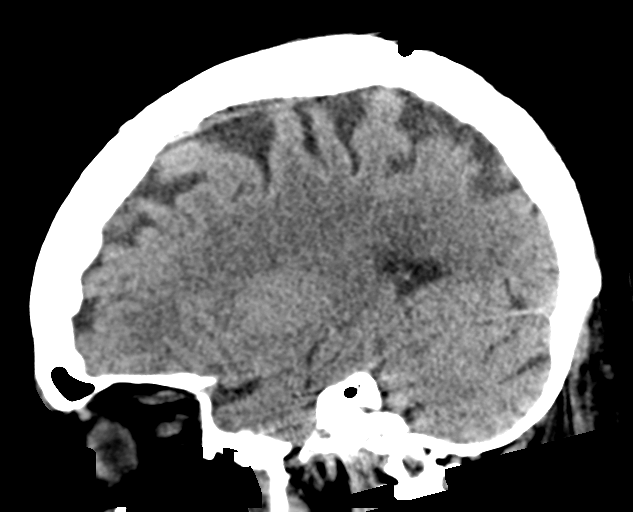

[15 of 47 positions shown; findings below may reference images not displayed]

FINDINGS: Brain: No evidence of acute infarction, hemorrhage, hydrocephalus,
extra-axial collection or mass lesion/mass effect.

Vascular: Moderate carotid siphon calcifications bilaterally. No
hyperdense vessels.

Skull: No fracture or primary bone lesions.

Sinuses/Orbits: Clear mastoids and paranasal sinuses. Intact orbits
and globes. Bilateral lens replacements surgeries.

Other: Mild left posterior parietal scalp contusion.
IMPRESSION: No acute intracranial abnormality. Left posterior parietal scalp
contusion.

## 2018-08-07 DIAGNOSIS — J449 Chronic obstructive pulmonary disease, unspecified: Secondary | ICD-10-CM | POA: Diagnosis not present

## 2018-09-07 DIAGNOSIS — J449 Chronic obstructive pulmonary disease, unspecified: Secondary | ICD-10-CM | POA: Diagnosis not present

## 2018-10-08 DIAGNOSIS — J449 Chronic obstructive pulmonary disease, unspecified: Secondary | ICD-10-CM | POA: Diagnosis not present

## 2018-10-31 DIAGNOSIS — N189 Chronic kidney disease, unspecified: Secondary | ICD-10-CM | POA: Diagnosis not present

## 2018-10-31 DIAGNOSIS — E1169 Type 2 diabetes mellitus with other specified complication: Secondary | ICD-10-CM | POA: Diagnosis not present

## 2018-10-31 DIAGNOSIS — G4700 Insomnia, unspecified: Secondary | ICD-10-CM | POA: Diagnosis not present

## 2018-11-06 DIAGNOSIS — J449 Chronic obstructive pulmonary disease, unspecified: Secondary | ICD-10-CM | POA: Diagnosis not present

## 2018-12-07 DIAGNOSIS — J449 Chronic obstructive pulmonary disease, unspecified: Secondary | ICD-10-CM | POA: Diagnosis not present

## 2018-12-14 ENCOUNTER — Other Ambulatory Visit: Payer: Self-pay | Admitting: Cardiology

## 2018-12-14 DIAGNOSIS — I6523 Occlusion and stenosis of bilateral carotid arteries: Secondary | ICD-10-CM

## 2019-01-06 DIAGNOSIS — J449 Chronic obstructive pulmonary disease, unspecified: Secondary | ICD-10-CM | POA: Diagnosis not present

## 2019-01-16 DIAGNOSIS — R5383 Other fatigue: Secondary | ICD-10-CM | POA: Diagnosis not present

## 2019-01-16 DIAGNOSIS — F329 Major depressive disorder, single episode, unspecified: Secondary | ICD-10-CM | POA: Diagnosis not present

## 2019-01-22 DIAGNOSIS — S299XXA Unspecified injury of thorax, initial encounter: Secondary | ICD-10-CM | POA: Diagnosis not present

## 2019-01-22 DIAGNOSIS — S199XXA Unspecified injury of neck, initial encounter: Secondary | ICD-10-CM | POA: Diagnosis not present

## 2019-01-22 DIAGNOSIS — Y998 Other external cause status: Secondary | ICD-10-CM | POA: Diagnosis not present

## 2019-01-22 DIAGNOSIS — M25511 Pain in right shoulder: Secondary | ICD-10-CM | POA: Diagnosis not present

## 2019-01-22 DIAGNOSIS — M546 Pain in thoracic spine: Secondary | ICD-10-CM | POA: Diagnosis not present

## 2019-01-22 DIAGNOSIS — M25551 Pain in right hip: Secondary | ICD-10-CM | POA: Diagnosis not present

## 2019-01-22 DIAGNOSIS — R0902 Hypoxemia: Secondary | ICD-10-CM | POA: Diagnosis not present

## 2019-01-22 DIAGNOSIS — W0110XA Fall on same level from slipping, tripping and stumbling with subsequent striking against unspecified object, initial encounter: Secondary | ICD-10-CM | POA: Diagnosis not present

## 2019-01-22 DIAGNOSIS — S4991XA Unspecified injury of right shoulder and upper arm, initial encounter: Secondary | ICD-10-CM | POA: Diagnosis not present

## 2019-01-22 DIAGNOSIS — S79911A Unspecified injury of right hip, initial encounter: Secondary | ICD-10-CM | POA: Diagnosis not present

## 2019-01-22 DIAGNOSIS — R4020 Unspecified coma: Secondary | ICD-10-CM | POA: Diagnosis not present

## 2019-01-22 DIAGNOSIS — R52 Pain, unspecified: Secondary | ICD-10-CM | POA: Diagnosis not present

## 2019-01-22 DIAGNOSIS — S0990XA Unspecified injury of head, initial encounter: Secondary | ICD-10-CM | POA: Diagnosis not present

## 2019-01-22 DIAGNOSIS — Y92009 Unspecified place in unspecified non-institutional (private) residence as the place of occurrence of the external cause: Secondary | ICD-10-CM | POA: Diagnosis not present

## 2019-01-27 DIAGNOSIS — I110 Hypertensive heart disease with heart failure: Secondary | ICD-10-CM | POA: Diagnosis not present

## 2019-01-27 DIAGNOSIS — N189 Chronic kidney disease, unspecified: Secondary | ICD-10-CM | POA: Diagnosis not present

## 2019-01-27 DIAGNOSIS — I5032 Chronic diastolic (congestive) heart failure: Secondary | ICD-10-CM | POA: Diagnosis not present

## 2019-01-27 DIAGNOSIS — M1990 Unspecified osteoarthritis, unspecified site: Secondary | ICD-10-CM | POA: Diagnosis not present

## 2019-01-27 DIAGNOSIS — D631 Anemia in chronic kidney disease: Secondary | ICD-10-CM | POA: Diagnosis not present

## 2019-01-27 DIAGNOSIS — E7800 Pure hypercholesterolemia, unspecified: Secondary | ICD-10-CM | POA: Diagnosis not present

## 2019-01-27 DIAGNOSIS — F329 Major depressive disorder, single episode, unspecified: Secondary | ICD-10-CM | POA: Diagnosis not present

## 2019-01-27 DIAGNOSIS — J439 Emphysema, unspecified: Secondary | ICD-10-CM | POA: Diagnosis not present

## 2019-01-27 DIAGNOSIS — G4700 Insomnia, unspecified: Secondary | ICD-10-CM | POA: Diagnosis not present

## 2019-01-27 DIAGNOSIS — I2510 Atherosclerotic heart disease of native coronary artery without angina pectoris: Secondary | ICD-10-CM | POA: Diagnosis not present

## 2019-01-27 DIAGNOSIS — I6521 Occlusion and stenosis of right carotid artery: Secondary | ICD-10-CM | POA: Diagnosis not present

## 2019-01-27 DIAGNOSIS — I700 Atherosclerosis of aorta: Secondary | ICD-10-CM | POA: Diagnosis not present

## 2019-01-27 DIAGNOSIS — E1122 Type 2 diabetes mellitus with diabetic chronic kidney disease: Secondary | ICD-10-CM | POA: Diagnosis not present

## 2019-01-30 DIAGNOSIS — F329 Major depressive disorder, single episode, unspecified: Secondary | ICD-10-CM | POA: Diagnosis not present

## 2019-02-06 DIAGNOSIS — J449 Chronic obstructive pulmonary disease, unspecified: Secondary | ICD-10-CM | POA: Diagnosis not present

## 2019-02-27 DIAGNOSIS — I6521 Occlusion and stenosis of right carotid artery: Secondary | ICD-10-CM | POA: Diagnosis not present

## 2019-02-27 DIAGNOSIS — M1990 Unspecified osteoarthritis, unspecified site: Secondary | ICD-10-CM | POA: Diagnosis not present

## 2019-02-27 DIAGNOSIS — I700 Atherosclerosis of aorta: Secondary | ICD-10-CM | POA: Diagnosis not present

## 2019-02-27 DIAGNOSIS — N189 Chronic kidney disease, unspecified: Secondary | ICD-10-CM | POA: Diagnosis not present

## 2019-02-27 DIAGNOSIS — I2510 Atherosclerotic heart disease of native coronary artery without angina pectoris: Secondary | ICD-10-CM | POA: Diagnosis not present

## 2019-02-27 DIAGNOSIS — E7800 Pure hypercholesterolemia, unspecified: Secondary | ICD-10-CM | POA: Diagnosis not present

## 2019-02-27 DIAGNOSIS — G4700 Insomnia, unspecified: Secondary | ICD-10-CM | POA: Diagnosis not present

## 2019-02-27 DIAGNOSIS — J439 Emphysema, unspecified: Secondary | ICD-10-CM | POA: Diagnosis not present

## 2019-02-27 DIAGNOSIS — I110 Hypertensive heart disease with heart failure: Secondary | ICD-10-CM | POA: Diagnosis not present

## 2019-02-27 DIAGNOSIS — E1122 Type 2 diabetes mellitus with diabetic chronic kidney disease: Secondary | ICD-10-CM | POA: Diagnosis not present

## 2019-02-27 DIAGNOSIS — D631 Anemia in chronic kidney disease: Secondary | ICD-10-CM | POA: Diagnosis not present

## 2019-02-27 DIAGNOSIS — I5032 Chronic diastolic (congestive) heart failure: Secondary | ICD-10-CM | POA: Diagnosis not present

## 2019-02-27 DIAGNOSIS — F329 Major depressive disorder, single episode, unspecified: Secondary | ICD-10-CM | POA: Diagnosis not present

## 2019-03-08 DIAGNOSIS — J449 Chronic obstructive pulmonary disease, unspecified: Secondary | ICD-10-CM | POA: Diagnosis not present

## 2019-03-12 ENCOUNTER — Other Ambulatory Visit: Payer: Medicare Other

## 2019-03-18 ENCOUNTER — Ambulatory Visit (INDEPENDENT_AMBULATORY_CARE_PROVIDER_SITE_OTHER): Payer: Medicare Other

## 2019-03-18 ENCOUNTER — Other Ambulatory Visit: Payer: Self-pay

## 2019-03-18 DIAGNOSIS — I6523 Occlusion and stenosis of bilateral carotid arteries: Secondary | ICD-10-CM

## 2019-03-20 ENCOUNTER — Ambulatory Visit: Payer: Medicare Other | Admitting: Cardiology

## 2019-03-20 ENCOUNTER — Other Ambulatory Visit: Payer: Self-pay

## 2019-03-20 ENCOUNTER — Encounter: Payer: Self-pay | Admitting: Cardiology

## 2019-03-20 DIAGNOSIS — J432 Centrilobular emphysema: Secondary | ICD-10-CM

## 2019-03-20 DIAGNOSIS — I5032 Chronic diastolic (congestive) heart failure: Secondary | ICD-10-CM | POA: Diagnosis not present

## 2019-03-20 DIAGNOSIS — I6523 Occlusion and stenosis of bilateral carotid arteries: Secondary | ICD-10-CM | POA: Diagnosis not present

## 2019-03-20 MED ORDER — ASPIRIN EC 81 MG PO TBEC: 81.0000 mg | DELAYED_RELEASE_TABLET | Freq: Every day | ORAL | 3 refills | Status: AC

## 2019-03-20 NOTE — Progress Notes (Signed)
Virtual Visit via Telephone Note: Patient unable to use video assisted device.  This visit type was conducted due to national recommendations for restrictions regarding the COVID-19 Pandemic (e.g. social distancing).  This format is felt to be most appropriate for this patient at this time.  All issues noted in this document were discussed and addressed.  No physical exam was performed.  The patient has consented to conduct a Telehealth visit and understands insurance will be billed.   I connected with@, on 03/20/19 at  by TELEPHONE and verified that I am speaking with the correct person using two identifiers.   I discussed the limitations of evaluation and management by telemedicine and the availability of in person appointments. The patient expressed understanding and agreed to proceed.   I have discussed with patient regarding the safety during COVID Pandemic and steps and precautions to be taken including social distancing, frequent hand wash and use of detergent soap, gels with the patient. I asked the patient to avoid touching mouth, nose, eyes, ears with the hands. I encouraged regular walking around the neighborhood and exercise and regular diet, as long as social distancing can be maintained.   Primary Physician/Referring:  Kim, James, MD  Patient ID: Beth Kerr, female    DOB: 06/04/1941, 78 y.o.   MRN: [id number]  Chief Complaint  Patient presents with  . Hypertension   HPI:    HPI: Beth Kerr  is a 78 y.o. asymptomatic carotid artery stenosis, diastolic CHF, hypertension, uncontrolled diabetes mellitus, hyperlipidemia and prior history of tobacco use disorder and severe emphysema and on continuous home O2. She now presents for 6 month follow up visit for hypertension. With the past 6 months she has been off of her antihypertensive medication, states that she has not had any problems and her blood pressure has been well controlled although she did not check her blood  pressure today for me.  Except for chronic dyspnea she has no other specific complaints today.  Denies leg edema, PND or orthopnea.  Past Medical History:  Diagnosis Date  . Diabetes mellitus without complication (HCC)   . Emphysema/COPD (HCC)   . Hyperlipidemia   . Hypertension    Past Surgical History:  Procedure Laterality Date  . DILATION AND CURETTAGE OF UTERUS    . TONSILLECTOMY     Social History   Socioeconomic History  . Marital status: Divorced    Spouse name: Not on file  . Number of children: Not on file  . Years of education: Not on file  . Highest education level: Not on file  Occupational History  . Not on file  Social Needs  . Financial resource strain: Not on file  . Food insecurity    Worry: Not on file    Inability: Not on file  . Transportation needs    Medical: Not on file    Non-medical: Not on file  Tobacco Use  . Smoking status: Former Smoker    Packs/day: 2.00    Years: 28.00    Pack years: 56.00    Types: Cigarettes    Quit date: 12/1995    Years since quitting: 23.2  . Smokeless tobacco: Never Used  Substance and Sexual Activity  . Alcohol use: No  . Drug use: No  . Sexual activity: Not on file  Lifestyle  . Physical activity    Days per week: Not on file    Minutes per session: Not on file  . Stress: Not on file    Relationships  . Social Herbalist on phone: Not on file    Gets together: Not on file    Attends religious service: Not on file    Active member of club or organization: Not on file    Attends meetings of clubs or organizations: Not on file    Relationship status: Not on file  . Intimate partner violence    Fear of current or ex partner: Not on file    Emotionally abused: Not on file    Physically abused: Not on file    Forced sexual activity: Not on file  Other Topics Concern  . Not on file  Social History Narrative  . Not on file   ROS  Review of Systems  Constitution: Negative for chills,  decreased appetite, malaise/fatigue and weight gain.  Cardiovascular: Positive for leg swelling (chronic). Negative for dyspnea on exertion and syncope.  Respiratory: Positive for cough (chronic) and shortness of breath (chronic). Negative for sputum production and wheezing.   Endocrine: Negative for cold intolerance.  Hematologic/Lymphatic: Does not bruise/bleed easily.  Musculoskeletal: Positive for muscle cramps (at night). Negative for joint swelling.  Gastrointestinal: Negative for abdominal pain, anorexia, change in bowel habit, hematochezia and melena.  Neurological: Negative for headaches and light-headedness.  Psychiatric/Behavioral: Negative for depression and substance abuse.  All other systems reviewed and are negative.  Objective  There were no vitals taken for this visit. There is no height or weight on file to calculate BMI. Physical exam not performed or limited due to virtual visit.  Please see exam details from prior visit is as below.   Physical Exam  Constitutional: She appears well-developed and well-nourished. No distress.  HENT:  Head: Atraumatic.  Eyes: Conjunctivae are normal.  Neck: Neck supple. No JVD present. No thyromegaly present.  Cardiovascular: Normal rate, regular rhythm, normal heart sounds, intact distal pulses and normal pulses. Exam reveals no gallop.  No murmur heard. Pulses:      Carotid pulses are on the right side with bruit and on the left side with bruit. Bilateral trace edema present.  Pulmonary/Chest: Effort normal. She has no wheezes. She has no rales.  Barrel-shaped chest.  Bilateral prolonged expiration present.  Abdominal: Soft. Bowel sounds are normal.  Musculoskeletal: Normal range of motion.  Neurological: She is alert.  Skin: Skin is warm and dry.  Psychiatric: She has a normal mood and affect.   Laboratory examination:   02/28/2018: Creatinine 1.14, potassium 3.6, sodium 147, EGFR 36, CMP otherwise normal.  02/16/2018: RBC 3.3,  hemoglobin 10.4, hematocrit 34.3, MCV 103, CBC otherwise normal.  Hemoglobin A1c 6%.  Cholesterol 131, triglycerides 170, HDL 52, LDL 45.  CMP Latest Ref Rng & Units 12/20/2017 02/03/2017 02/02/2017  Glucose 70 - 99 mg/dL 114(H) 156(H) 132(H)  BUN 6 - 23 mg/dL 26(H) 21(H) 26(H)  Creatinine 0.40 - 1.20 mg/dL 1.35(H) 0.77 0.77  Sodium 135 - 145 mEq/L 140 140 146(H)  Potassium 3.5 - 5.1 mEq/L 4.1 4.1 3.4(L)  Chloride 96 - 112 mEq/L 98 96(L) 100(L)  CO2 19 - 32 mEq/L 33(H) 35(H) 37(H)  Calcium 8.4 - 10.5 mg/dL 9.4 9.0 8.9  Total Protein 6.5 - 8.1 g/dL - - -  Total Bilirubin 0.3 - 1.2 mg/dL - - -  Alkaline Phos 38 - 126 U/L - - -  AST 15 - 41 U/L - - -  ALT 14 - 54 U/L - - -   CBC Latest Ref Rng & Units 02/02/2017 02/01/2017 01/31/2017  WBC 4.0 - 10.5 K/uL 7.1 6.6 7.7  Hemoglobin 12.0 - 15.0 g/dL 11.6(L) 10.4(L) 10.3(L)  Hematocrit 36.0 - 46.0 % 38.0 33.8(L) 34.9(L)  Platelets 150 - 400 K/uL 169 152 160   Lipid Panel     Component Value Date/Time   TRIG 153 (H) 01/29/2017 0934   HEMOGLOBIN A1C Lab Results  Component Value Date   HGBA1C 7.1 (H) 01/28/2017   MPG 157 01/28/2017   TSH No results for input(s): TSH in the last 8760 hours. Medications   Current Outpatient Medications  Medication Instructions  . albuterol (PROVENTIL HFA;VENTOLIN HFA) 108 (90 Base) MCG/ACT inhaler 2 puffs, Inhalation, Every 6 hours PRN  . aspirin EC 81 mg, Oral, Daily  . benzonatate (TESSALON) 100 mg, Oral, 2 times daily  . furosemide (LASIX) 40 mg, Oral, Daily  . sertraline (ZOLOFT) 50 mg, Oral, Daily  . traZODone (DESYREL) 50 mg, Oral, As needed   Cardiac Studies:   Lexiscan stress 09/21/12: Resting EKG NSR, Poor R wave progression. Stress EKG was non diagnostic for ischemia. No ST-T changes of ischemia noted with pharmacologic stress testing. Stress symptoms included SHORTNESS OF BREATH, HEADACHE AND CHEST PRESSURE. Stress terminated due to completion of protocol. Normal perfusion without ischemia. LVEF  77%.  Echo 01/28/2017 at [place]:  Left ventricle: The cavity size was normal. Wall thickness was normal. Systolic function was normal. The estimated ejection fraction was in the range of 55% to 60%. Wall motion was normal; there were no regional wall motion abnormalities. Features are consistent with a pseudonormal left ventricular filling pattern, with concomitant abnormal relaxation and increased filling pressure (grade 2 diastolic dysfunction).  Mitral valve: Calcified annulus.Right ventricle: The cavity size was mildly dilated.  Carotid artery duplex  03/18/2019: Stenosis in the right internal carotid artery (16-49%). Stenosis in the left internal carotid artery (16-49%). Mild stenosis in the left external carotid artery (<50%). Antegrade right vertebral artery flow. Antegrade left vertebral artery flow. Compared to the study done on 03/06/2018, the stenosis severity appears to be less, left ICA stenosis is new. Follow up in one year is appropriate if clinically indicated.  Assessment     ICD-10-CM   1. Chronic diastolic (congestive) heart failure (HCC)  I50.32   2. Centrilobular emphysema (HCC)  J43.2   3. Atherosclerosis of both carotid arteries  I65.23 PCV CAROTID DUPLEX (BILATERAL)    aspirin EC 81 MG tablet    EKG 03/20/2018: Normal sinus rhythm at 72 bpm, right axis deviation, RBBB, Low voltage complexes, no evidence of ischemia. No changes from 04/18/2017.  Recommendations:   Patient presents for follow-up of asymptomatic bilateral carotid artery stenosis, dyspnea and chronic diastolic heart failure.  She is presently not on any blood pressure medications but states that blood pressure is very well controlled.  Dyspnea has remained stable, no PND, has trace bilateral ankle edema which is remained stable.  Due to worsening renal dysfunction it appears that all the medications were discontinued including statins and it was never restarted.  She is also not on aspirin presently.   With regard to carotid artery stenosis, we will continue surveillance in 1 year, she has not had any TIA-like symptoms.  Continue aspirin for primary prevention.  She is not on a statin, previously on Crestor and LDL was well controlled.  She will follow-up with her PCP for evaluation and management of her lipids.  Otherwise from cardiac standpoint she remained stable, I will see her back in a year.   , MD, FACC 03/20/2019,   4:38 PM [facility] Pager: [phone] Office: [phone] If no answer Cell [phone] 

## 2019-03-28 ENCOUNTER — Ambulatory Visit: Payer: Medicare Other | Admitting: Neurology

## 2019-03-28 ENCOUNTER — Encounter: Payer: Self-pay | Admitting: Neurology

## 2019-03-28 ENCOUNTER — Other Ambulatory Visit: Payer: Self-pay

## 2019-03-28 VITALS — BP 142/72 | HR 100 | Temp 99.3°F | Ht 62.0 in | Wt 140.0 lb

## 2019-03-28 DIAGNOSIS — E538 Deficiency of other specified B group vitamins: Secondary | ICD-10-CM

## 2019-03-28 DIAGNOSIS — R413 Other amnesia: Secondary | ICD-10-CM | POA: Diagnosis not present

## 2019-03-28 MED ORDER — RIVASTIGMINE TARTRATE 1.5 MG PO CAPS: 1.5000 mg | ORAL_CAPSULE | Freq: Two times a day (BID) | ORAL | 2 refills | Status: DC

## 2019-03-28 NOTE — Progress Notes (Signed)
Reason for visit: Memory disturbance  Referring physician: Dr. Earnestine LeysPharr  Beth Kerr is a 78 y.o. female  History of present illness:  Beth Kerr is a 78 year old right-handed white female with a history of a fall with significant injury due to a concussion that occurred 3 years ago.  The patient comes in with her grandson today, he claims that following the fall the patient had a significant sudden decline in her cognitive functioning, she was normal prior to that.  She continued to live alone until about a month ago; at that time she moved in with her grandson.  Over the last 3 years however, she has required some assistance keeping up with medications and appointments.  The patient does most of her finances on her own.  She does not operate a motor vehicle, she has not been driving since she fell.  She reports no focal numbness or weakness of the face, arms, legs.  She denies any difficulty controlling the bowels or the bladder, she sleeps well at night.  She indicates that her mother died with Alzheimer's disease.  Her memory issue has gradually worsened slightly over time.  In the past, she was started on a medication for memory, she cannot remember the name of it but it was a once a day medication that she did not tolerate.  The patient has had MRI of the brain done in 2018, this was relatively unremarkable, minimal white matter changes were noted.  She is sent to this office for further evaluation.  Past Medical History:  Diagnosis Date  . Diabetes mellitus without complication (HCC)   . Emphysema/COPD (HCC)   . Hyperlipidemia   . Hypertension     Past Surgical History:  Procedure Laterality Date  . DILATION AND CURETTAGE OF UTERUS    . TONSILLECTOMY      Family History  Problem Relation Age of Onset  . CAD Father   . Heart disease Father   . Lung cancer Other   . Alzheimer's disease Maternal Grandmother   . Diabetes Neg Hx   . Stroke Neg Hx     Social history:   reports that she quit smoking about 23 years ago. Her smoking use included cigarettes. She has a 56.00 pack-year smoking history. She has never used smokeless tobacco. She reports that she does not drink alcohol or use drugs.  Medications:  Prior to Admission medications   Medication Sig Start Date End Date Taking? Authorizing Provider  albuterol (PROVENTIL HFA;VENTOLIN HFA) 108 (90 Base) MCG/ACT inhaler Inhale 2 puffs into the lungs every 6 (six) hours as needed for wheezing or shortness of breath. 04/25/18  Yes Bevelyn NgoGroce, Sarah F, NP  aspirin EC 81 MG tablet Take 1 tablet (81 mg total) by mouth daily. 03/20/19  Yes Yates DecampGanji, Jay, MD  benzonatate (TESSALON) 100 MG capsule Take 100 mg by mouth 2 (two) times daily.  09/02/17  Yes [provider]  furosemide (LASIX) 40 MG tablet Take 1 tablet (40 mg total) by mouth daily. 02/04/17  Yes Jerald Kiefhiu, Stephen K, MD  sertraline (ZOLOFT) 50 MG tablet Take 50 mg by mouth daily.   Yes [provider]  traZODone (DESYREL) 50 MG tablet Take 50 mg by mouth as needed for sleep.   Yes [provider]      Allergies  Allergen Reactions  . Demerol [Meperidine] Anaphylaxis    ROS:  Out of a complete 14 system review of symptoms, the patient complains only of the following symptoms,  and all other reviewed systems are negative.  Memory disturbance Shortness of breath, oxygen Gait disorder  Blood pressure (!) 142/72, pulse 100, temperature 99.3 F (37.4 C), height 5\' 2"  (1.575 m), weight 140 lb (63.5 kg).  Physical Exam  General: The patient is alert and cooperative at the time of the examination.  Eyes: Pupils are equal, round, and reactive to light. Discs are flat bilaterally.  Neck: The neck is supple, no carotid bruits are noted.  Respiratory: The respiratory examination is clear.  Cardiovascular: The cardiovascular examination reveals a regular rate and rhythm, no obvious murmurs or rubs are noted.  Skin: Extremities are without  significant edema on the right, the patient has 2-3+ edema on the left with some redness in the distal leg and foot.  Neurologic Exam  Mental status: The patient is alert and oriented x 3 at the time of the examination. The patient has apparent normal recent and remote memory, with an apparently normal attention span and concentration ability.  The Mini-Mental status examination done today shows a total score of 29/30.  Cranial nerves: Facial symmetry is present. There is good sensation of the face to pinprick and soft touch bilaterally. The strength of the facial muscles and the muscles to head turning and shoulder shrug are normal bilaterally. Speech is well enunciated, no aphasia or dysarthria is noted. Extraocular movements are full. Visual fields are full. The tongue is midline, and the patient has symmetric elevation of the soft palate. No obvious hearing deficits are noted.  Motor: The motor testing reveals 5 over 5 strength of all 4 extremities. Good symmetric motor tone is noted throughout.  Sensory: Sensory testing is intact to pinprick, soft touch, vibration sensation, and position sense on all 4 extremities. No evidence of extinction is noted.  Coordination: Cerebellar testing reveals good finger-nose-finger and heel-to-shin bilaterally.  Gait and station: Gait is slightly wide-based, the patient normally uses a walker for ambulation, she seems to have good gait stability with a walker.  Romberg is negative but is unsteady.  Tandem gait was not attempted.  Reflexes: Deep tendon reflexes are symmetric, but are slightly depressed bilaterally. Toes are downgoing bilaterally.   MRI brain 01/28/17:  IMPRESSION: No acute or reversible finding. Age related atrophy and ordinary small vessel change of the hemispheric white matter.  * MRI scan images were reviewed online. I agree with the written report.    Assessment/Plan:  1.  Mild memory disturbance  2.  Gait disturbance  The  patient actually scores fairly well on the Mini-Mental status examination, but the grandson indicates a gradual change in memory over time, she is requiring some assistance keeping up with medications, appointments, and finances.  The patient will be started on Exelon in low-dose taking 1.5 mg twice daily.  The patient may have been on Aricept previously but did not tolerate it.  She will have further blood work today, she will follow-up in 6 months.  Jill Alexanders MD 03/28/2019 4:28 PM  [facility] [address] [place], [place] [address]  Phone [phone] Fax [phone]

## 2019-03-28 NOTE — Patient Instructions (Signed)
We will start Exelon 1.5 mg twice a day for the memory.

## 2019-03-29 LAB — VITAMIN B12: Vitamin B-12: 425 pg/mL (ref 232–1245)

## 2019-03-29 LAB — RPR: RPR Ser Ql: NONREACTIVE

## 2019-04-02 ENCOUNTER — Telehealth: Payer: Self-pay

## 2019-04-02 NOTE — Telephone Encounter (Signed)
I reached out to the pt and advised of message. She verbalized understanding.

## 2019-04-02 NOTE — Telephone Encounter (Signed)
-----   Message from Kathrynn Ducking, MD sent at 03/29/2019  7:49 AM EDT -----  The blood work results are unremarkable. Please call the patient. ----- Message ----- From: Lavone Neri Lab Results In Sent: 03/29/2019   5:38 AM EDT To: Kathrynn Ducking, MD

## 2019-04-08 DIAGNOSIS — J449 Chronic obstructive pulmonary disease, unspecified: Secondary | ICD-10-CM | POA: Diagnosis not present

## 2019-05-09 DIAGNOSIS — J449 Chronic obstructive pulmonary disease, unspecified: Secondary | ICD-10-CM | POA: Diagnosis not present

## 2019-05-31 DIAGNOSIS — N390 Urinary tract infection, site not specified: Secondary | ICD-10-CM | POA: Diagnosis not present

## 2019-05-31 DIAGNOSIS — E119 Type 2 diabetes mellitus without complications: Secondary | ICD-10-CM | POA: Diagnosis not present

## 2019-05-31 DIAGNOSIS — E785 Hyperlipidemia, unspecified: Secondary | ICD-10-CM | POA: Diagnosis not present

## 2019-05-31 DIAGNOSIS — J449 Chronic obstructive pulmonary disease, unspecified: Secondary | ICD-10-CM | POA: Diagnosis not present

## 2019-05-31 DIAGNOSIS — I10 Essential (primary) hypertension: Secondary | ICD-10-CM | POA: Diagnosis not present

## 2019-06-05 DIAGNOSIS — Z0000 Encounter for general adult medical examination without abnormal findings: Secondary | ICD-10-CM | POA: Diagnosis not present

## 2019-06-05 DIAGNOSIS — R413 Other amnesia: Secondary | ICD-10-CM | POA: Diagnosis not present

## 2019-06-05 DIAGNOSIS — E785 Hyperlipidemia, unspecified: Secondary | ICD-10-CM | POA: Diagnosis not present

## 2019-06-05 DIAGNOSIS — E119 Type 2 diabetes mellitus without complications: Secondary | ICD-10-CM | POA: Diagnosis not present

## 2019-06-08 DIAGNOSIS — J449 Chronic obstructive pulmonary disease, unspecified: Secondary | ICD-10-CM | POA: Diagnosis not present

## 2019-06-22 ENCOUNTER — Emergency Department (HOSPITAL_COMMUNITY)
Admission: EM | Admit: 2019-06-22 | Discharge: 2019-06-22 | Disposition: A | Discharge status: Home / Self Care | Payer: Medicare Other | Attending: Emergency Medicine | Admitting: Emergency Medicine

## 2019-06-22 ENCOUNTER — Emergency Department (HOSPITAL_COMMUNITY): Payer: Medicare Other

## 2019-06-22 DIAGNOSIS — Z7982 Long term (current) use of aspirin: Secondary | ICD-10-CM | POA: Diagnosis not present

## 2019-06-22 DIAGNOSIS — E119 Type 2 diabetes mellitus without complications: Secondary | ICD-10-CM | POA: Diagnosis not present

## 2019-06-22 DIAGNOSIS — J449 Chronic obstructive pulmonary disease, unspecified: Secondary | ICD-10-CM | POA: Insufficient documentation

## 2019-06-22 DIAGNOSIS — I10 Essential (primary) hypertension: Secondary | ICD-10-CM | POA: Diagnosis not present

## 2019-06-22 DIAGNOSIS — R52 Pain, unspecified: Secondary | ICD-10-CM | POA: Diagnosis not present

## 2019-06-22 DIAGNOSIS — Z9981 Dependence on supplemental oxygen: Secondary | ICD-10-CM | POA: Insufficient documentation

## 2019-06-22 DIAGNOSIS — W19XXXA Unspecified fall, initial encounter: Secondary | ICD-10-CM | POA: Diagnosis not present

## 2019-06-22 DIAGNOSIS — Z79899 Other long term (current) drug therapy: Secondary | ICD-10-CM | POA: Insufficient documentation

## 2019-06-22 DIAGNOSIS — Y92003 Bedroom of unspecified non-institutional (private) residence as the place of occurrence of the external cause: Secondary | ICD-10-CM | POA: Insufficient documentation

## 2019-06-22 DIAGNOSIS — M25572 Pain in left ankle and joints of left foot: Secondary | ICD-10-CM | POA: Diagnosis not present

## 2019-06-22 DIAGNOSIS — M7989 Other specified soft tissue disorders: Secondary | ICD-10-CM | POA: Diagnosis not present

## 2019-06-22 DIAGNOSIS — W06XXXA Fall from bed, initial encounter: Secondary | ICD-10-CM | POA: Insufficient documentation

## 2019-06-22 DIAGNOSIS — Y999 Unspecified external cause status: Secondary | ICD-10-CM | POA: Diagnosis not present

## 2019-06-22 DIAGNOSIS — Z87891 Personal history of nicotine dependence: Secondary | ICD-10-CM | POA: Diagnosis not present

## 2019-06-22 DIAGNOSIS — S93402A Sprain of unspecified ligament of left ankle, initial encounter: Secondary | ICD-10-CM

## 2019-06-22 DIAGNOSIS — S79912A Unspecified injury of left hip, initial encounter: Secondary | ICD-10-CM | POA: Diagnosis not present

## 2019-06-22 DIAGNOSIS — Y9389 Activity, other specified: Secondary | ICD-10-CM | POA: Insufficient documentation

## 2019-06-22 DIAGNOSIS — S99912A Unspecified injury of left ankle, initial encounter: Secondary | ICD-10-CM | POA: Diagnosis not present

## 2019-06-22 MED ORDER — ACETAMINOPHEN 325 MG PO TABS
650.0000 mg | ORAL_TABLET | Freq: Once | ORAL | Status: AC
  Administered 2019-06-22: 16:00:00 650 mg via ORAL
  Filled 2019-06-22: qty 2

## 2019-06-22 NOTE — ED Notes (Signed)
Pt's grandson called to pick pt up for D/C

## 2019-06-22 NOTE — ED Notes (Signed)
Patient transported to X-ray 

## 2019-06-22 NOTE — ED Triage Notes (Signed)
Per EMS: Pt from home. Pt tripped on a sheet and fell on her L side.  Pt c/o of L sided hip, ankle and arm pain.  Family states is at her baseline.

## 2019-06-22 NOTE — ED Provider Notes (Addendum)
[facility]-EMERGENCY DEPT Provider Note   CSN: [id number] Arrival date & time: 06/22/19  1243     History   Chief Complaint No chief complaint on file.   HPI Beth Kerr is a 78 y.o. female with past medical history significant for type II DM, hyperlipidemia, hypertension, COPD, and history of syncope and collapse presents to the ED via EMS after sustaining a mechanical fall at home.  Patient lives at home with her grandson and his wife.  She was climbing on the bed when she tripped on her bed sheets and landed on her left side.  She complains of left ankle pain as well as some left hip pain.  She denies any dizziness or syncope that preceded her fall.  Patient denies any head trauma or loss of consciousness.  Patient was unsure as to whether or not she was experiencing any chest pain or shortness of breath, but she denies any headache, recent illness, abdominal discomfort, nausea, vomiting, blurred vision, or any other focal neurologic deficit.  Suspect that there may be an element of dementia involved and will corroborate report with her family.  She takes aspirin but otherwise is not on blood thinners.  Patient is on 2 L supplemental O2 at home.  She did not receive any medications from EMS while en route.     HPI  Past Medical History:  Diagnosis Date  . Diabetes mellitus without complication (HCC)   . Emphysema/COPD (HCC)   . Hyperlipidemia   . Hypertension     Patient Active Problem List   Diagnosis Date Noted  . Nocturnal hypoxemia 04/25/2018  . COPD without exacerbation (HCC) 12/20/2017  . Elevated CK 01/29/2017  . Encephalopathy acute 01/29/2017  . Shock circulatory (HCC) 01/29/2017  . Memory disorder 01/28/2017  . Acute hypercapnic respiratory failure (HCC) 01/28/2017  . COPD exacerbation (HCC) 01/27/2017  . Rhabdomyolysis 01/27/2017  . Elevated troponin 01/27/2017  . DM (diabetes mellitus), type 2 with renal complications (HCC) 01/27/2017   . Syncope and collapse 01/27/2017    Past Surgical History:  Procedure Laterality Date  . DILATION AND CURETTAGE OF UTERUS    . TONSILLECTOMY       OB History   No obstetric history on file.      Home Medications    Prior to Admission medications   Medication Sig Start Date End Date Taking? Authorizing Provider  albuterol (PROVENTIL HFA;VENTOLIN HFA) 108 (90 Base) MCG/ACT inhaler Inhale 2 puffs into the lungs every 6 (six) hours as needed for wheezing or shortness of breath. 04/25/18  Yes Bevelyn NgoGroce, Sarah F, NP  aspirin EC 81 MG tablet Take 1 tablet (81 mg total) by mouth daily. 03/20/19  Yes Yates DecampGanji, Jay, MD  benzonatate (TESSALON) 100 MG capsule Take 100 mg by mouth 2 (two) times daily.  09/02/17  Yes [provider]  furosemide (LASIX) 40 MG tablet Take 1 tablet (40 mg total) by mouth daily. 02/04/17  Yes Jerald Kiefhiu, Stephen K, MD  Multiple Vitamin (MULTIVITAMIN) tablet Take 1 tablet by mouth daily.   Yes [provider]  rivastigmine (EXELON) 1.5 MG capsule Take 1 capsule (1.5 mg total) by mouth 2 (two) times daily. 03/28/19  Yes York SpanielWillis, Charles K, MD  sertraline (ZOLOFT) 50 MG tablet Take 50 mg by mouth daily.   Yes [provider]  traZODone (DESYREL) 50 MG tablet Take 25 mg by mouth as needed for sleep.    Yes [provider]    Family History Family History  Problem Relation Age of Onset  . CAD Father   . Heart disease Father   . Lung cancer Other   . Dementia Mother   . Alzheimer's disease Maternal Grandmother   . Cancer Sister   . Diabetes Neg Hx   . Stroke Neg Hx     Social History Social History   Tobacco Use  . Smoking status: Former Smoker    Packs/day: 2.00    Years: 28.00    Pack years: 56.00    Types: Cigarettes    Quit date: 12/1995    Years since quitting: 23.4  . Smokeless tobacco: Never Used  Substance Use Topics  . Alcohol use: No  . Drug use: No     Allergies   Demerol [meperidine]   Review of Systems Review of  Systems  All other systems reviewed and are negative.    Physical Exam Updated Vital Signs BP (!) 178/78   Pulse (!) 104   Temp 98.6 F (37 C) (Oral)   Resp 18   Ht  (1.549 m)   Wt 54.4 kg   SpO2 98%   BMI 22.67 kg/m   Physical Exam Vitals signs and nursing note reviewed. Exam conducted with a chaperone present.  Constitutional:      Appearance: Normal appearance.  HENT:     Head: Normocephalic and atraumatic.  Eyes:     General: No scleral icterus.    Extraocular Movements: Extraocular movements intact.     Pupils: Pupils are equal, round, and reactive to light.     Comments: Left eye: Subconjunctival hemorrhage  Neck:     Musculoskeletal: Normal range of motion. No neck rigidity.  Cardiovascular:     Rate and Rhythm: Regular rhythm.     Pulses: Normal pulses.     Heart sounds: Normal heart sounds.     Comments: Mildly tachycardic. Pulmonary:     Effort: Pulmonary effort is normal. No respiratory distress.     Breath sounds: Normal breath sounds.  Abdominal:     General: There is no distension.     Palpations: Abdomen is soft.     Tenderness: There is no abdominal tenderness. There is no guarding.  Musculoskeletal:     Comments: Left hip: Patient is able to extend leg and abduct, mildly limited by pain.  No focal TTP. Left knee: ROM and strength intact.  No TTP.  No swelling or discoloration. Left ankle: Mild swelling over the lateral malleolus.  TTP.  Range of motion intact, but limited by pain.  Pedal pulses and cap refill intact.  Sensation intact distally.  Skin:    General: Skin is dry.  Neurological:     Mental Status: She is alert.     GCS: GCS eye subscore is 4. GCS verbal subscore is 5. GCS motor subscore is 6.     Cranial Nerves: No cranial nerve deficit.     Sensory: No sensory deficit.  Psychiatric:        Mood and Affect: Mood normal.        Behavior: Behavior normal.        Thought Content: Thought content normal.      ED Treatments /  Results  Labs (all labs ordered are listed, but only abnormal results are displayed) Labs Reviewed - No data to display  EKG EKG Interpretation  Date/Time:  Saturday June 22 2019 13:16:54 EDT Ventricular Rate:  106 PR Interval:    QRS Duration: 135 QT Interval:  370 QTC Calculation:  492 R Axis:   95 Text Interpretation:  Sinus tachycardia RBBB and LPFB Abnormal ECG Confirmed by Carmin Muskrat [phone]) on 06/22/2019 1:46:04 PM   Radiology Dg Ankle Complete Left  Result Date: 06/22/2019 CLINICAL DATA:  Left ankle pain after fall EXAM: LEFT ANKLE COMPLETE - 3+ VIEW COMPARISON:  None. FINDINGS: Mild lateral left ankle soft tissue swelling. No fracture or subluxation. No suspicious focal osseous lesion. No radiopaque foreign body. IMPRESSION: Mild lateral left ankle soft tissue swelling, with no fracture or subluxation. Electronically Signed   By: Ilona Sorrel M.D.   On: 06/22/2019 13:48   Dg Hip Unilat W Or Wo Pelvis 2-3 Views Left  Result Date: 06/22/2019 CLINICAL DATA:  Fall, left hip pain EXAM: DG HIP (WITH OR WITHOUT PELVIS) 2-3V LEFT COMPARISON:  02/01/2017 CT abdomen/pelvis FINDINGS: No pelvic fracture or diastasis. No left hip fracture or dislocation. No suspicious focal osseous lesions. Degenerative changes in the visualized lower lumbar spine. IMPRESSION: No pelvic fracture. No left hip fracture or dislocation. Electronically Signed   By: Ilona Sorrel M.D.   On: 06/22/2019 13:50    Procedures Procedures (including critical care time)  Medications Ordered in ED Medications  acetaminophen (TYLENOL) tablet 650 mg (has no administration in time range)     Initial Impression / Assessment and Plan / ED Course  I have reviewed the triage vital signs and the nursing notes.  Pertinent labs & imaging results that were available during my care of the patient were reviewed by me and considered in my medical decision making (see chart for details).       On reevaluation,  patient is adamant that she did not have any chest pain or shortness of breath.  Patient's EKG was interpreted and is unchanged from last tracing approximately 2 years ago.  Given mechanical nature of fall and nothing to suggest that this was a syncopal episode, not concerned for any acute cardiopulmonary pathology such as an ACS, arrhythmia, or PE instigated syncopal event.   Plain films of patient's left ankle and left hip were reviewed and do not demonstrate any dislocation, fracture, or other acute bone abnormalities.  Patient was very reassured.  Patient is able to ambulate with assistance of a walker.  Given left ankle swelling, concerned for a sprain.  Will apply a ASO splint before discharge she is discharged into care of her grandson.  Tylenol as needed for pain discomfort.  Also encouraged RICE therapy for her swollen ankle and provided information in discharge instructions.  While was not an observed fall, patient denied any head injury, loss of consciousness, nausea and vomiting, visual changes or other neurologic deficits, or headache on examination.  There were no palpable skull defects or evidence of basilar skull fracture.  Do not feel as though a CT head was warranted to investigate for intracranial bleed.  However, provided patient with strict return precautions and brought them in discharge instructions.  Return to ED or seek medical attention should she develop any confusion, significant headache, chest pain or shortness of breath, visual changes or other neurologic deficits, nausea or vomiting, or any other new symptoms.  Patient voiced understanding and are agreeable to the plan.  Spoke with Hart Carwin, her grandson who confirmed that she was not on the ground very long and she did not appear particularly confused after she was helped up.  He is understanding of the evaluation and plan.  He is able to come pick her up from the ED and will read the  discharge papers for return precautions.     Final Clinical Impressions(s) / ED Diagnoses   Final diagnoses:  Sprain of left ankle, unspecified ligament, initial encounter    ED Discharge Orders    None       Lorelee New, PA-C 06/22/19 1614    Lorelee New, PA-C 06/22/19 1617    Lorelee New, PA-C 06/22/19 1620    Gerhard Munch, MD 06/22/19 1712

## 2019-06-22 NOTE — Discharge Instructions (Addendum)
Please review attachment on RICE therapy. Rest, Ice, Compression, and Elevation (RICE) for your left ankle pain.   Please take extra care while ambulating until your ankle discomfort resolves.  Take Tylenol as needed for discomfort.  Return to ED or seek medical attention should she develop any confusion, significant headache, chest pain or shortness of breath, visual changes or other new deficits, nausea or vomiting, or any other new symptoms.

## 2019-07-02 ENCOUNTER — Telehealth: Payer: Self-pay | Admitting: Internal Medicine

## 2019-07-02 NOTE — Telephone Encounter (Signed)
Left message for patient's son to call back.   Patient hasn't been seen since 2019 and will need an OV as there are no neb solutions listed on her medication list.

## 2019-07-03 NOTE — Telephone Encounter (Signed)
LMOM TCB x2

## 2019-07-04 NOTE — Telephone Encounter (Signed)
Lmtcb. Pt is also due for an appt.

## 2019-07-05 NOTE — Telephone Encounter (Signed)
We have attempted to contact pt's son several times with no success or call back from him. Per triage protocol, message will be closed.  

## 2019-07-09 DIAGNOSIS — J449 Chronic obstructive pulmonary disease, unspecified: Secondary | ICD-10-CM | POA: Diagnosis not present

## 2019-07-18 ENCOUNTER — Other Ambulatory Visit: Payer: Self-pay

## 2019-07-18 ENCOUNTER — Other Ambulatory Visit: Payer: Self-pay | Admitting: Neurology

## 2019-07-18 ENCOUNTER — Ambulatory Visit: Payer: Medicare Other | Admitting: Internal Medicine

## 2019-07-18 ENCOUNTER — Encounter: Payer: Self-pay | Admitting: Internal Medicine

## 2019-07-18 VITALS — BP 128/56 | HR 85 | Ht 62.0 in | Wt 135.4 lb

## 2019-07-18 DIAGNOSIS — J449 Chronic obstructive pulmonary disease, unspecified: Secondary | ICD-10-CM | POA: Diagnosis not present

## 2019-07-18 MED ORDER — YUPELRI 175 MCG/3ML IN SOLN: 1.0000 | Freq: Every day | RESPIRATORY_TRACT | 11 refills | Status: DC

## 2019-07-18 MED ORDER — BUDESONIDE 0.25 MG/2ML IN SUSP: 0.2500 mg | Freq: Two times a day (BID) | RESPIRATORY_TRACT | 12 refills | Status: DC

## 2019-07-18 MED ORDER — ALBUTEROL SULFATE (2.5 MG/3ML) 0.083% IN NEBU: 2.5000 mg | INHALATION_SOLUTION | Freq: Four times a day (QID) | RESPIRATORY_TRACT | 12 refills | Status: DC | PRN

## 2019-07-18 NOTE — Patient Instructions (Addendum)
ICD-10-CM   1. Stage 3 severe COPD by GOLD classification ([place]) J44.9     You have severe copd  Plan - you need to be on maintenbance copd Rx regardless of you feel  -start yupelri nebulizer daily  - star pulmicort 0.25mg  nebulizer twice daily  - use albuterol neb  as needed   = order new mask and neb machine for above - wear 2LNC at night and day time - continuous   Followup  -  4 weeks with APP to report progerss - and then see if further adjustments to breathing treatment needed

## 2019-07-18 NOTE — Progress Notes (Signed)
IOV 11/17/2017  Chief Complaint  Patient presents with   Consult    Self referral for COPD.  Pt diagnosed with COPD 01/2017. States she fell and after 5 days of laying in the floor after fall, was taken to the hospital 01/27/18 and after 5 days in hosp. was taken to rehab x3 weeks. Pt denies any real complaints of cough or SOB. Has some midsternal pain that has been happening x3-4 weeks. Pt usually wears O2 24/7 2-4L.   Beth Kerr [facility] 78 y.o. with Beth Kerr self referred for copd.  History is gained from talking to her grandson, talking to her and review of the chart.  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  She was admitted for hypercapnic respiratory failure and intubated.  She is about that admission and then discharged.  Prior to that she only had a diagnosis of COPD based on pulmonary function test but she was not taking any inhalers.  After that discharge she went on oxygen for the first time and has convalesced at home and according to her and her grandson doing fairly well.  She finds the use of oxygen very inconvenient and often does not like to use it.  She does several activities of daily living including cooking and changing beds and changing her clothes.  She does this without oxygen.  She is not on any maintenance inhalers.  She followed up with her primary care physician and was asked to come in to reestablish with pulmonary.  Although she has never seen pulmonary in the office she is only seen [name] in the critical care side.  She might have to take inhalers despite she is surprised that feeling subjectively well.  She is surprised that she still needs to be on oxygen.  Today on room air at rest after being off oxygen for 1 hour her pulse ox was 90% but she desaturated at 30 yard mark according to my medical assistant.  Currently overall she feels fine.  COPD CAT score is 15 and reflects a baseline. Walking desaturation tst -  185 feet x 3 laps opn RA - at half lap desaturated to 86% and stayed good at 2L [place] (pulse ox 90% roomn air at rest 90%)     04/25/2018  3 Month follow up: Pt. Presents for follow up. She states she is not compliant with her Stialto. It is too hard to use. She has a hard time coordinating it. She iscompliant withher oxygen at 2 L [place] at all times. She states she monitors her sats and they average about 90%.She is not using her Duonebs and she states she has not used them in about 1 year.She states she is not coughing anything up at all. She is taking tessalon perles for cough twice daily. She does not have a rescue inhaler to use for intermittent wheezing. She states she does not have much use for any complicated medications. She denies any fever, chest pain, orthopnea or hemoptysis. She is planning on getting her flu vaccine through her PCP.   Test Results: Results for Beth Kerr (MRN [id number]) as of 11/17/2017 16:49  Ref. Range 06/30/2016 10:42  FEV1-Post Latest Units: L 0.81  FEV1-%Pred-Post Latest Units: % 45  FEV1-%Change-Post Latest Units: % 5  Post FEV1/FVC ratio Latest Units: % 44  Results for Beth Kerr (MRN [id number]) as of 11/17/2017 16:49  Ref. Range 06/30/2016 10:42  DLCO unc Latest Units: ml/min/mmHg 12.64  DLCO unc % pred Latest Units: % 62   MPRESSION CT chest: 01/28/2017 1. Negative for acute pulmonary embolus. 2. Small focal consolidation in the lingula may reflect atelectasis or small infiltrate. There are trace effusions 3. Mild emphysema 4. Indeterminate 13 mm left adrenal gland nodule partially visualized. Diffuse nodularity of the right adrenal gland. Further evaluation with adrenal CT is suggested.  Arterial Blood Gas Results Results for Beth Kerr (MRN [id number]) as of 11/17/2017 16:49  Ref. Range 01/28/2017 05:45 01/28/2017 10:40 01/28/2017 12:44 01/28/2017 17:47 01/29/2017 04:10  pH, Arterial Latest Ref Range: 7.350 - 7.450  7.205 (L) 7.152 (LL) 7.270  (L) 7.335 (L) 7.366  pCO2 arterial Latest Ref Range: 32.0 - 48.0 mmHg 82.9 (HH) 91.5 (HH) 65.4 (HH) 55.8 (H) 48.2 (H)  pO2, Arterial Latest Ref Range: 83.0 - 108.0 mmHg 397 (H) 275 (H) 96.1 83.7 123 (H)   ECHO June 2018 - gr 2 diastolic dysf   OV 07/18/2019  Subjective:  Patient ID: Beth Kerr, female , DOB: 03/03/1941 , age 55 y.o. , MRN: [id number] , ADDRESS: [address] [place] [facility] [id number]   07/18/2019 -   Chief Complaint  Patient presents with   Follow-up    Pt states she has been doing okay. Pt states she does have problems with her breathing at rest and also with exertion. Pt is on 2L 24/7. Pt does have an occ cough. Denies any real complaints of chest discomfort.   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 -presents for follow-up.  Last visit was in August 2019.  After that have not seen her.  She now comes with the grand daughter-in-law.  She is now moved in with her grandson because she was living alone and falling.  Is been several months since she took any of her inhalers.  Also she not able to use inhalers because of continued frail health and older age.  She is mostly ECOG 4.  They are asking for a change to nebulizers.  The grand daughter-in-law notices that without oxygen at rest the pulse ox desaturates into the 70s with 2 L it corrects.  There is no history of any exacerbations recently.  The main purpose of this visit is to get nebulizers.  We went over different options.     CAT COPD Symptom & Quality of Life Score (GSK trademark) 0 is no burden. 5 is highest burden 11/17/2017  07/18/2019   Never Cough -> Cough all the time 1 2  No phlegm in chest -> Chest is full of phlegm 0 0  No chest tightness -> Chest feels very tight 0 3  No dyspnea for 1 flight stairs/hill -> Very dyspneic for 1 flight of stairs 3 - wheel chair  No limitations for ADL at home -> Very limited with ADL at home 3 3  Confident  leaving home -> Not at all confident leaving home 5 2  Sleep soundly -> Do not sleep soundly because of lung condition 0 1  Lots of Energy -> No energy at all 3 3  TOTAL Score (max 40)  15 14    ROS - per HPI     has a past medical history of Diabetes mellitus without complication (HCC), Emphysema/COPD (HCC), Hyperlipidemia, and Hypertension.   reports that she quit smoking about 23 years ago. Her smoking use included cigarettes. She has a 56.00 pack-year smoking history. She  has never used smokeless tobacco.  Past Surgical History:  Procedure Laterality Date   DILATION AND CURETTAGE OF UTERUS     TONSILLECTOMY      Allergies  Allergen Reactions   Demerol [Meperidine] Anaphylaxis    Immunization History  Administered Date(s) Administered   Influenza, High Dose Seasonal PF 04/25/2017, 04/30/2019    Family History  Problem Relation Age of Onset   CAD Father    Heart disease Father    Lung cancer Other    Dementia Mother    Alzheimer's disease Maternal Grandmother    Cancer Sister    Diabetes Neg Hx    Stroke Neg Hx      Current Outpatient Medications:    aspirin EC 81 MG tablet, Take 1 tablet (81 mg total) by mouth daily., Disp: 90 tablet, Rfl: 3   benzonatate (TESSALON) 100 MG capsule, Take 100 mg by mouth 2 (two) times daily. , Disp: , Rfl: 12   furosemide (LASIX) 40 MG tablet, Take 1 tablet (40 mg total) by mouth daily., Disp: 30 tablet, Rfl: 0   Multiple Vitamin (MULTIVITAMIN) tablet, Take 1 tablet by mouth daily., Disp: , Rfl:    sertraline (ZOLOFT) 50 MG tablet, Take 50 mg by mouth daily., Disp: , Rfl:    traZODone (DESYREL) 50 MG tablet, Take 25 mg by mouth as needed for sleep. , Disp: , Rfl:    albuterol (PROVENTIL) (2.5 MG/3ML) 0.083% nebulizer solution, Take 3 mLs (2.5 mg total) by nebulization every 6 (six) hours as needed for wheezing or shortness of breath., Disp: 75 mL, Rfl: 12   budesonide (PULMICORT) 0.25 MG/2ML nebulizer solution,  Take 2 mLs (0.25 mg total) by nebulization 2 (two) times daily., Disp: 60 mL, Rfl: 12   Revefenacin (YUPELRI) 175 MCG/3ML SOLN, Inhale 1 vial into the lungs daily., Disp: 90 mL, Rfl: 11   rivastigmine (EXELON) 1.5 MG capsule, TAKE 1 CAPSULE(1.5 MG) BY MOUTH TWICE DAILY, Disp: 60 capsule, Rfl: 2      Objective:   Vitals:   07/18/19 1224  BP: (!) 128/56  Pulse: 85  SpO2: 99%  Weight: 135 lb 6.4 oz (61.4 kg)  Height: 5\' 2"  (1.575 m)    Estimated body mass index is 24.76 kg/m as calculated from the following:   Height as of this encounter: 5\' 2"  (1.575 m).   Weight as of this encounter: 135 lb 6.4 oz (61.4 kg).  @WEIGHTCHANGE @  [employer]   07/18/19 1224  Weight: 135 lb 6.4 oz (61.4 kg)     Physical Exam  General Appearance:   Frail deconditioned lady sitting in a wheelchair but pleasant and cooperative  Head:    Normocephalic, without obvious abnormality, atraumatic  Eyes:    PERRL, conjunctiva/corneas clear,  Ears:    Normal TM's and external ear canals, both ears  Nose:   Nares normal, septum midline, mucosa normal, no drainage    or sinus tenderness. OXYGEN ON  - yes . Patient is @ 2L   Throat:   Lips, mucosa, and tongue normal; teeth and gums normal. Cyanosis on lips - no  Neck:   Supple, symmetrical, trachea midline, no adenopathy;    thyroid:  no enlargement/tenderness/nodules; no carotid   bruit or JVD  Back:     Symmetric, no curvature, ROM normal, no CVA tenderness  Lungs:     Distress - no , Wheeze no, Barrell Chest - yes, Purse lip breathing - yes, Crackles - no. Silent overall   Chest Wall:  No tenderness or deformity.    Heart:    Regular rate and rhythm, S1 and S2 normal, no rub   or gallop, Murmur - no  Breast Exam:    NOT DONE  Abdomen:     Soft, non-tender, bowel sounds active all four quadrants,    no masses, no organomegaly. Visceral obesity - no  Genitalia:   NOT DONE  Rectal:   NOT DONE  Extremities:   Extremities - normal, Has Cane - no,  Clubbing - yes, Edema - no  Pulses:   2+ and symmetric all extremities  Skin:   Stigmata of Connective Tissue Disease - no  Lymph nodes:   Cervical, supraclavicular, and axillary nodes normal  Psychiatric:  Neurologic:   Pleasant - yes, Anxious - no, Flat affect - no  CAm-ICU - neg, Alert and Oriented x 3 - yes, Moves all 4s - yes, Speech - normal, Cognition - intact           Assessment:       ICD-10-CM   1. COPD without exacerbation (HCC)  J44.9   2. Stage 3 severe COPD by GOLD classification (HCC)  J44.9 Ambulatory Referral for DME       Plan:     Patient Instructions     ICD-10-CM   1. Stage 3 severe COPD by GOLD classification (HCC) J44.9     You have severe copd  Plan - you need to be on maintenbance copd Rx regardless of you feel  -start yupelri nebulizer daily  - star pulmicort 0.25mg  nebulizer twice daily  - use albuterol neb  as needed   = order new mask and neb machine for above - wear 2LNC at night and day time - continuous   Followup  -  4 weeks with APP to report progerss - and then see if further adjustments to breathing treatment needed     SIGNATURE    Dr. Kalman Shan, M.D., F.C.C.P,  Pulmonary and Critical Care Medicine Staff Physician, [facility] Director - Interstitial Lung Disease  Program  Pulmonary Fibrosis [facility] at [facility] [place], [place], [address]  Pager: [phone], If no answer or between  15:00h - 7:00h: call [phone] Telephone: [phone]  7:06 PM 07/18/2019

## 2019-07-24 DIAGNOSIS — J449 Chronic obstructive pulmonary disease, unspecified: Secondary | ICD-10-CM | POA: Diagnosis not present

## 2019-07-30 ENCOUNTER — Ambulatory Visit: Payer: Medicare Other | Admitting: Podiatry

## 2019-08-08 DIAGNOSIS — J449 Chronic obstructive pulmonary disease, unspecified: Secondary | ICD-10-CM | POA: Diagnosis not present

## 2019-08-11 NOTE — Progress Notes (Signed)
Virtual Visit via Telephone Note  I connected with Beth Kerr on 08/12/19 at 11:30 AM EST by telephone and verified that I am speaking with the correct person using two identifiers.  Location: Patient: Home Provider: Office [facility] - [address] [place], [address], [place], [place] [address]   I discussed the limitations, risks, security and privacy concerns of performing an evaluation and management service by telephone and the availability of in person appointments. I also discussed with the patient that there may be a patient responsible charge related to this service. The patient expressed understanding and agreed to proceed.  Patient consented to consult via telephone: Yes People present and their role in pt care: Pt     History of Present Illness: 78 year old female former smoker followed in our office for severe COPD  Past medical history: Rhabdomyolysis, elevated troponin Type 2 diabetes, memory disorder Smoking history: Former smoker, quit 1997.  56-pack-year smoking history Maintenance: Yupelri, Pulmicort Patient of [name]  Chief complaint: 4-week follow-up after starting Yupelri and Pulmicort   78 year old female former smoker completing a televisit with our office today.  Patient reports that she has started nebulized meds Yupelri.  Cost about $200 out-of-pocket from [facility] pharmacy.  She believes that this is helped significantly.  She has not yet started Pulmicort nebs.  She was unsure if she needed to.  Her granddaughter is planning on helping her with patient assistance application through [place].  Overall patient feels that she is doing well.  She is tolerating the nebulized meds well.  Observations/Objective:  02/02/2017-CBC with differential-eosinophils relative 4, eosinophils absolute 0.3  01/28/2017-CTA chest-mild emphysema within the upper lobes, negative for acute PE, small focal consolidation in the lingula may reflect atelectasis or small  infiltrate  01/28/2017-echocardiogram-LV ejection fraction 55 to 60%, grade 2 diastolic dysfunction, right ventricle cavity size was mildly dilated  06/30/2016-pulmonary function test-FVC 1.61 (67% predicted), postbronchodilator ratio 44, postbronchodilator FEV1 0.81 (45% predicted), positive bronchodilator response in FVC, mid flow reversibility, DLCO 12.64 (62% predicted)  Social History   Tobacco Use  Smoking Status Former Smoker  . Packs/day: 2.00  . Years: 28.00  . Pack years: 56.00  . Types: Cigarettes  . Quit date: 12/1995  . Years since quitting: 23.6  Smokeless Tobacco Never Used   Immunization History  Administered Date(s) Administered  . Influenza, High Dose Seasonal PF 04/25/2017, 04/30/2019   SIX MIN WALK 11/17/2017  Supplimental Oxygen during Test? (L/min) Yes  O2 Flow Rate 3  Type Continuous  Tech Comments: After a half a lap she was 88% o2 was placed on 3L then went up to 92 %.    Assessment and Plan:  Nocturnal hypoxemia Plan:  Continue oxygen as prescribed   Medication management Plan:  Continue Yupelri  Pt Granddtr to apply for patient assistance  Explained to patient's granddaughter that if she has issues with applying for patient assistance for you coloration contact our office and we can place a referral to [facility] for medication access assistance  COPD without exacerbation (HCC) Plan: Continue Yupelri nebulized meds Start Pulmicort nebs with Yupelri, can use second dose of Pulmicort as needed for shortness of breath or wheezing in the evening Patient's granddaughter to apply for patient assistance for Yupelri cost, $200 out-of-pocket through [facility] pharmacy Offered samples today, they declined    Follow Up Instructions:  Return in about 2 months (around 10/13/2019), or if symptoms worsen or fail to improve, for Follow up with [name], With walk  in office.   I discussed the assessment and treatment plan with the patient.  The patient was provided an opportunity to ask questions and all were answered. The patient agreed with the plan and demonstrated an understanding of the instructions.   The patient was advised to call back or seek an in-person evaluation if the symptoms worsen or if the condition fails to improve as anticipated.  I provided 23 minutes of non-face-to-face time during this encounter.   Lauraine Rinne, NP

## 2019-08-12 ENCOUNTER — Ambulatory Visit (INDEPENDENT_AMBULATORY_CARE_PROVIDER_SITE_OTHER): Payer: Medicare Other | Admitting: Pulmonary Disease

## 2019-08-12 ENCOUNTER — Other Ambulatory Visit: Payer: Self-pay

## 2019-08-12 ENCOUNTER — Encounter: Payer: Self-pay | Admitting: Pulmonary Disease

## 2019-08-12 DIAGNOSIS — Z87891 Personal history of nicotine dependence: Secondary | ICD-10-CM | POA: Diagnosis not present

## 2019-08-12 DIAGNOSIS — Z9981 Dependence on supplemental oxygen: Secondary | ICD-10-CM

## 2019-08-12 DIAGNOSIS — G4734 Idiopathic sleep related nonobstructive alveolar hypoventilation: Secondary | ICD-10-CM

## 2019-08-12 DIAGNOSIS — J449 Chronic obstructive pulmonary disease, unspecified: Secondary | ICD-10-CM | POA: Diagnosis not present

## 2019-08-12 DIAGNOSIS — R351 Nocturia: Secondary | ICD-10-CM | POA: Diagnosis not present

## 2019-08-12 DIAGNOSIS — Z79899 Other long term (current) drug therapy: Secondary | ICD-10-CM | POA: Insufficient documentation

## 2019-08-12 DIAGNOSIS — R0902 Hypoxemia: Secondary | ICD-10-CM | POA: Diagnosis not present

## 2019-08-12 NOTE — Assessment & Plan Note (Signed)
Plan:  Continue Yupelri  Pt Granddtr to apply for patient assistance  Explained to patient's granddaughter that if she has issues with applying for patient assistance for you coloration contact our office and we can place a referral to [facility] for medication access assistance

## 2019-08-12 NOTE — Patient Instructions (Addendum)
You were seen today by Beth Ceo, NP  for:   1. COPD without exacerbation (HCC)  Continue Yupelri nebulized meds Start Pulmicort nebulized meds twice daily Rinse mouth out after use  Note your daily symptoms > remember "red flags" for COPD:   >>>Increase in cough >>>increase in sputum production >>>increase in shortness of breath or activity  intolerance.   If you notice these symptoms, please call the office to be seen.   Follow-up with our office in 2 to 3 months with [name]  2.  Chronic respiratory failure  Continue oxygen therapy as prescribed  >>>maintain oxygen saturations greater than 88 percent  >>>if unable to maintain oxygen saturations please contact the office  >>>do not smoke with oxygen  >>>can use nasal saline gel or nasal saline rinses to moisturize nose if oxygen causes dryness  I would recommend that you continue to maintain 2 L of O2 24/7  We will need to do a walk in our office at next office visit to further evaluate oxygen needs during the day and with exertion  3. Medication management  Apply for patient assistance to help with overall cost of Yupelri  Contact our office if you have any issues with this, we can office place a referral to [facility] to help with medication access   Follow Up:    Return in about 2 months (around 10/13/2019), or if symptoms worsen or fail to improve, for Follow up with [name].   Please do your part to reduce the spread of COVID-19:      Reduce your risk of any infection  and COVID19 by using the similar precautions used for avoiding the common cold or flu:  Beth Kerr Wash your hands often with soap and warm water for at least 20 seconds.  If soap and water are not readily available, use an alcohol-based hand sanitizer with at least 60% alcohol.  . If coughing or sneezing, cover your mouth and nose by coughing or sneezing into the elbow areas of your shirt or coat, into a tissue or into your  sleeve (not your hands). Beth Kerr A MASK when in public  . Avoid shaking hands with others and consider head nods or verbal greetings only. . Avoid touching your eyes, nose, or mouth with unwashed hands.  . Avoid close contact with people who are sick. . Avoid places or events with large numbers of people in one location, like concerts or sporting events. . If you have some symptoms but not all symptoms, continue to monitor at home and seek medical attention if your symptoms worsen. . If you are having a medical emergency, call 911.   ADDITIONAL HEALTHCARE OPTIONS FOR PATIENTS  [place] [facility] / e-Visit: [email or web address]         [facility]: [phone]  [name]: [phone]                   [name]: [phone]     It is flu season:   >>> Best ways to protect herself from the flu: Receive the yearly flu vaccine, practice good hand hygiene washing with soap and also using hand sanitizer when available, eat a nutritious meals, get adequate rest, hydrate appropriately   Please contact the office if your symptoms worsen or you have concerns that you are not improving.   Thank you for choosing [facility] for your healthcare, and for allowing Beth Kerr to partner with you  on your healthcare journey. I am thankful to be able to provide care to you today.   Beth Quaker FNP-C    Chronic Obstructive Pulmonary Disease Chronic obstructive pulmonary disease (COPD) is a long-term (chronic) lung problem. When you have COPD, it is hard for air to get in and out of your lungs. Usually the condition gets worse over time, and your lungs will never return to normal. There are things you can do to keep yourself as healthy as possible.  Your doctor may treat your condition with: ? Medicines. ? Oxygen. ? Lung surgery.  Your doctor may also recommend: ? Rehabilitation. This includes steps to  make your body work better. It may involve a team of specialists. ? Quitting smoking, if you smoke. ? Exercise and changes to your diet. ? Comfort measures (palliative care). Follow these instructions at home: Medicines  Take over-the-counter and prescription medicines only as told by your doctor.  Talk to your doctor before taking any cough or allergy medicines. You may need to avoid medicines that cause your lungs to be dry. Lifestyle  If you smoke, stop. Smoking makes the problem worse. If you need help quitting, ask your doctor.  Avoid being around things that make your breathing worse. This may include smoke, chemicals, and fumes.  Stay active, but remember to rest as well.  Learn and use tips on how to relax.  Make sure you get enough sleep. Most adults need at least 7 hours of sleep every night.  Eat healthy foods. Eat smaller meals more often. Rest before meals. Controlled breathing Learn and use tips on how to control your breathing as told by your doctor. Try:  Breathing in (inhaling) through your nose for 1 second. Then, pucker your lips and breath out (exhale) through your lips for 2 seconds.  Putting one hand on your belly (abdomen). Breathe in slowly through your nose for 1 second. Your hand on your belly should move out. Pucker your lips and breathe out slowly through your lips. Your hand on your belly should move in as you breathe out.  Controlled coughing Learn and use controlled coughing to clear mucus from your lungs. Follow these steps: 1. Lean your head a little forward. 2. Breathe in deeply. 3. Try to hold your breath for 3 seconds. 4. Keep your mouth slightly open while coughing 2 times. 5. Spit any mucus out into a tissue. 6. Rest and do the steps again 1 or 2 times as needed. General instructions  Make sure you get all the shots (vaccines) that your doctor recommends. Ask your doctor about a flu shot and a pneumonia shot.  Use oxygen therapy and  pulmonary rehabilitation if told by your doctor. If you need home oxygen therapy, ask your doctor if you should buy a tool to measure your oxygen level (oximeter).  Make a COPD action plan with your doctor. This helps you to know what to do if you feel worse than usual.  Manage any other conditions you have as told by your doctor.  Avoid going outside when it is very hot, cold, or humid.  Avoid people who have a sickness you can catch (contagious).  Keep all follow-up visits as told by your doctor. This is important. Contact a doctor if:  You cough up more mucus than usual.  There is a change in the color or thickness of the mucus.  It is harder to breathe than usual.  Your breathing is faster than usual.  You have  trouble sleeping.  You need to use your medicines more often than usual.  You have trouble doing your normal activities such as getting dressed or walking around the house. Get help right away if:  You have shortness of breath while resting.  You have shortness of breath that stops you from: ? Being able to talk. ? Doing normal activities.  Your chest hurts for longer than 5 minutes.  Your skin color is more blue than usual.  Your pulse oximeter shows that you have low oxygen for longer than 5 minutes.  You have a fever.  You feel too tired to breathe normally. Summary  Chronic obstructive pulmonary disease (COPD) is a long-term lung problem.  The way your lungs work will never return to normal. Usually the condition gets worse over time. There are things you can do to keep yourself as healthy as possible.  Take over-the-counter and prescription medicines only as told by your doctor.  If you smoke, stop. Smoking makes the problem worse. This information is not intended to replace advice given to you by your health care provider. Make sure you discuss any questions you have with your health care provider. Document Released: 02/01/2008 Document Revised:  07/28/2017 Document Reviewed: 09/19/2016 Elsevier Patient Education  2020 [employer]

## 2019-08-12 NOTE — Assessment & Plan Note (Signed)
Plan: Continue Yupelri nebulized meds Start Pulmicort nebs with Yupelri, can use second dose of Pulmicort as needed for shortness of breath or wheezing in the evening Patient's granddaughter to apply for patient assistance for Yupelri cost, $200 out-of-pocket through [facility] samples today, they declined

## 2019-08-12 NOTE — Assessment & Plan Note (Addendum)
Plan: Continue oxygen as prescribed 

## 2019-08-26 DIAGNOSIS — J449 Chronic obstructive pulmonary disease, unspecified: Secondary | ICD-10-CM | POA: Diagnosis not present

## 2019-09-08 DIAGNOSIS — J449 Chronic obstructive pulmonary disease, unspecified: Secondary | ICD-10-CM | POA: Diagnosis not present

## 2019-09-20 ENCOUNTER — Ambulatory Visit: Payer: Medicare Other | Admitting: Podiatry

## 2019-09-20 ENCOUNTER — Encounter: Payer: Self-pay | Admitting: Podiatry

## 2019-09-20 ENCOUNTER — Other Ambulatory Visit: Payer: Self-pay

## 2019-09-20 VITALS — Temp 97.8°F

## 2019-09-20 DIAGNOSIS — E119 Type 2 diabetes mellitus without complications: Secondary | ICD-10-CM

## 2019-09-20 DIAGNOSIS — B351 Tinea unguium: Secondary | ICD-10-CM

## 2019-09-20 DIAGNOSIS — M79676 Pain in unspecified toe(s): Secondary | ICD-10-CM | POA: Diagnosis not present

## 2019-09-20 NOTE — Progress Notes (Signed)
Patient ID: Beth Kerr, female   DOB: 07/14/1941, 79 y.o.   MRN: [id number] Complaint:  Visit Type: Patient returns to my office for continued preventative foot care services. Complaint: Patient states" my nails have grown long and thick and become painful to walk and wear shoes" Patient has been diagnosed with DM with no foot complications. The patient presents for preventative foot care services. No changes to ROS.  Patient has not been seen in over 20 months.  Podiatric Exam: Vascular: dorsalis pedis and posterior tibial pulses are palpable bilateral. Capillary return is immediate. Temperature gradient is WNL. Skin turgor WNL  Sensorium: Normal Semmes Weinstein monofilament test. Normal tactile sensation bilaterally. Nail Exam: Pt has thick disfigured discolored nails with subungual debris noted bilateral entire nail hallux through fifth toenails Ulcer Exam: There is no evidence of ulcer or pre-ulcerative changes or infection. Orthopedic Exam: Muscle tone and strength are WNL. No limitations in general ROM. No crepitus or effusions noted. Foot type and digits show no abnormalities. Bony prominences are unremarkable. Skin: No Porokeratosis. No infection or ulcers  Diagnosis:  Onychomycosis, , Pain in right toe, pain in left toes  Treatment & Plan Procedures and Treatment: Consent by patient was obtained for treatment procedures. The patient understood the discussion of treatment and procedures well. All questions were answered thoroughly reviewed. Debridement of mycotic and hypertrophic toenails, 1 through 5 bilateral and clearing of subungual debris. No ulceration, no infection noted.  Return Visit-Office Procedure: Patient instructed to return to the office for a follow up visit 3 months for continued evaluation and treatment.   Helane Gunther DPM

## 2019-09-24 DIAGNOSIS — E119 Type 2 diabetes mellitus without complications: Secondary | ICD-10-CM | POA: Diagnosis not present

## 2019-10-08 DIAGNOSIS — J449 Chronic obstructive pulmonary disease, unspecified: Secondary | ICD-10-CM | POA: Diagnosis not present

## 2019-10-09 DIAGNOSIS — J449 Chronic obstructive pulmonary disease, unspecified: Secondary | ICD-10-CM | POA: Diagnosis not present

## 2019-10-15 ENCOUNTER — Telehealth: Payer: Self-pay | Admitting: Internal Medicine

## 2019-10-15 NOTE — Telephone Encounter (Signed)
Appt needed when pt calls office

## 2019-10-16 DIAGNOSIS — R413 Other amnesia: Secondary | ICD-10-CM | POA: Diagnosis not present

## 2019-10-16 DIAGNOSIS — N189 Chronic kidney disease, unspecified: Secondary | ICD-10-CM | POA: Diagnosis not present

## 2019-10-16 DIAGNOSIS — E785 Hyperlipidemia, unspecified: Secondary | ICD-10-CM | POA: Diagnosis not present

## 2019-10-16 DIAGNOSIS — E119 Type 2 diabetes mellitus without complications: Secondary | ICD-10-CM | POA: Diagnosis not present

## 2019-11-04 ENCOUNTER — Other Ambulatory Visit: Payer: Self-pay

## 2019-11-04 MED ORDER — RIVASTIGMINE TARTRATE 1.5 MG PO CAPS: ORAL_CAPSULE | ORAL | 6 refills | Status: DC

## 2019-11-06 DIAGNOSIS — J449 Chronic obstructive pulmonary disease, unspecified: Secondary | ICD-10-CM | POA: Diagnosis not present

## 2019-11-19 DIAGNOSIS — J449 Chronic obstructive pulmonary disease, unspecified: Secondary | ICD-10-CM | POA: Diagnosis not present

## 2019-12-07 DIAGNOSIS — J449 Chronic obstructive pulmonary disease, unspecified: Secondary | ICD-10-CM | POA: Diagnosis not present

## 2019-12-09 ENCOUNTER — Other Ambulatory Visit: Payer: Self-pay

## 2019-12-09 ENCOUNTER — Encounter: Payer: Self-pay | Admitting: Internal Medicine

## 2019-12-09 ENCOUNTER — Ambulatory Visit: Payer: Medicare Other | Admitting: Internal Medicine

## 2019-12-09 VITALS — BP 130/64 | HR 62 | Ht 62.0 in | Wt 135.0 lb

## 2019-12-09 DIAGNOSIS — J9611 Chronic respiratory failure with hypoxia: Secondary | ICD-10-CM

## 2019-12-09 DIAGNOSIS — J449 Chronic obstructive pulmonary disease, unspecified: Secondary | ICD-10-CM

## 2019-12-09 NOTE — Progress Notes (Signed)
IOV 11/17/2017  Chief Complaint  Patient presents with  . Consult    Self referral for COPD.  Pt diagnosed with COPD 01/2017. States she fell and after 5 days of laying in the floor after fall, was taken to the hospital 01/27/18 and after 5 days in hosp. was taken to rehab x3 weeks. Pt denies any real complaints of cough or SOB. Has some midsternal pain that has been happening x3-4 weeks. Pt usually wears O2 24/7 2-4L.   Beth Kerr 79 y.o. with [address] [place] [address] self referred for copd.  History is gained from talking to her grandson, talking to her and review of the chart.  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  She was admitted for hypercapnic respiratory failure and intubated.  She is about that admission and then discharged.  Prior to that she only had a diagnosis of COPD based on pulmonary function test but she was not taking any inhalers.  After that discharge she went on oxygen for the first time and has convalesced at home and according to her and her grandson doing fairly well.  She finds the use of oxygen very inconvenient and often does not like to use it.  She does several activities of daily living including cooking and changing beds and changing her clothes.  She does this without oxygen.  She is not on any maintenance inhalers.  She followed up with her primary care physician and was asked to come in to reestablish with pulmonary.  Although she has never seen pulmonary in the office she is only seen [name] in the critical care side.  She might have to take inhalers despite she is surprised that feeling subjectively well.  She is surprised that she still needs to be on oxygen.  Today on room air at rest after being off oxygen for 1 hour her pulse ox was 90% but she desaturated at 30 yard mark according to my medical assistant.  Currently overall she feels fine.  COPD CAT score is 15 and reflects a baseline. Walking desaturation  tst - 185 feet x 3 laps opn RA - at half lap desaturated to 86% and stayed good at 2L [place] (pulse ox 90% roomn air at rest 90%)     04/25/2018  3 Month follow up: Pt. Presents for follow up. She states she is not compliant with her Stialto. It is too hard to use. She has a hard time coordinating it. She iscompliant withher oxygen at 2 L [place] at all times. She states she monitors her sats and they average about 90%.She is not using her Duonebs and she states she has not used them in about 1 year.She states she is not coughing anything up at all. She is taking tessalon perles for cough twice daily. She does not have a rescue inhaler to use for intermittent wheezing. She states she does not have much use for any complicated medications. She denies any fever, chest pain, orthopnea or hemoptysis. She is planning on getting her flu vaccine through her PCP.   Test Results: Results for Beth, Kerr (MRN [id number]) as of 11/17/2017 16:49  Ref. Range 06/30/2016 10:42  FEV1-Post Latest Units: L 0.81  FEV1-%Pred-Post Latest Units: % 45  FEV1-%Change-Post Latest Units: % 5  Post FEV1/FVC ratio Latest Units: % 44  Results for Beth, Kerr (MRN [id number]) as of 11/17/2017 16:49  Ref. Range 06/30/2016  10:42  DLCO unc Latest Units: ml/min/mmHg 12.64  DLCO unc % pred Latest Units: % 62   MPRESSION CT chest: 01/28/2017 1. Negative for acute pulmonary embolus. 2. Small focal consolidation in the lingula may reflect atelectasis or small infiltrate. There are trace effusions 3. Mild emphysema 4. Indeterminate 13 mm left adrenal gland nodule partially visualized. Diffuse nodularity of the right adrenal gland. Further evaluation with adrenal CT is suggested.  Arterial Blood Gas Results Results for Beth, Kerr (MRN [id number]) as of 11/17/2017 16:49  Ref. Range 01/28/2017 05:45 01/28/2017 10:40 01/28/2017 12:44 01/28/2017 17:47 01/29/2017 04:10  pH, Arterial Latest Ref Range: 7.350 - 7.450  7.205 (L) 7.152 (LL)  7.270 (L) 7.335 (L) 7.366  pCO2 arterial Latest Ref Range: 32.0 - 48.0 mmHg 82.9 (HH) 91.5 (HH) 65.4 (HH) 55.8 (H) 48.2 (H)  pO2, Arterial Latest Ref Range: 83.0 - 108.0 mmHg 397 (H) 275 (H) 96.1 83.7 123 (H)   ECHO June 2018 - gr 2 diastolic dysf   OV 07/18/2019  Subjective:  Patient ID: Beth Kerr, female , DOB: Feb 05, 1941 , age 79 y.o. , MRN: [id number] , ADDRESS: [address] [place] [address]   07/18/2019 -   Chief Complaint  Patient presents with  . Follow-up    Pt states she has been doing okay. Pt states she does have problems with her breathing at rest and also with exertion. Pt is on 2L 24/7. Pt does have an occ cough. Denies any real complaints of chest discomfort.   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 -presents for follow-up.  Last visit was in August 2019.  After that have not seen her.  She now comes with the grand daughter-in-law.  She is now moved in with her grandson because she was living alone and falling.  Is been several months since she took any of her inhalers.  Also she not able to use inhalers because of continued frail health and older age.  She is mostly ECOG 4.  They are asking for a change to nebulizers.  The grand daughter-in-law notices that without oxygen at rest the pulse ox desaturates into the 70s with 2 L it corrects.  There is no history of any exacerbations recently.  The main purpose of this visit is to get nebulizers.  We went over different options.  ROS - per HPI  OV 12/09/2019  Subjective:  Patient ID: Beth Kerr, female , DOB: 05-22-1941 , age 79 y.o. , MRN: [id number] , ADDRESS: [address] [place] [address]   12/09/2019 -   Chief Complaint  Patient presents with  . Follow-up    Pt states she has been doing good since last visit and denies any real concerns. Pt's granddaughter states at times if pt's O2 gets kinked, pt's sats will drop to the low 70s but  then once they fix the O2, pt's sats will go back up quick.   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 -follow-up COPD.  Presents with her daughter-in-law [name].  Of note the paternal aunt of the daughter-in-law is Caesar Chestnut the [employer], ICU nurse.  Overall reported a stable oxygen continue COPD CAT score is 21.  Takes long-acting anticholinergic nebulizer daily.  Pulmicort nebulizer daily but only once a day.  Patient feels she can go without oxygen for a few hours but the daughter-in-law says she desaturates easily.  They want a lighter portable oxygen system that last longer.  The current tank only last 45 minutes.  She is not able to go to church overall ECOG is 3-4.  Last blood gas test was in 2018.  Saw the nurse practitioner in December 2020 for COPD exacerbation.      CAT COPD Symptom & Quality of Life Score (GSK trademark) 0 is no burden. 5 is highest burden 11/17/2017  07/18/2019   Never Cough -> Cough all the time 1 2  No phlegm in chest -> Chest is full of phlegm 0 0  No chest tightness -> Chest feels very tight 0 3  No dyspnea for 1 flight stairs/hill -> Very dyspneic for 1 flight of stairs 3 - wheel chair  No limitations for ADL at home -> Very limited with ADL at home 3 3  Confident leaving home -> Not at all confident leaving home 5 2  Sleep soundly -> Do not sleep soundly because of lung condition 0 1  Lots of Energy -> No energy at all 3 3  TOTAL Score (max 40)  15 14    CAT Score 12/09/2019  Total CAT Score 21      ROS - per HPI     has a past medical history of Diabetes mellitus without complication (HCC), Emphysema/COPD (HCC), Hyperlipidemia, and Hypertension.   reports that she quit smoking about 23 years ago. Her smoking use included cigarettes. She has a 56.00 pack-year smoking history. She has never used smokeless tobacco.  Past Surgical History:  Procedure Laterality Date  . DILATION AND  CURETTAGE OF UTERUS    . TONSILLECTOMY      Allergies  Allergen Reactions  . Demerol [Meperidine] Anaphylaxis    Immunization History  Administered Date(s) Administered  . Influenza, High Dose Seasonal PF 04/25/2017, 04/30/2019  . Influenza-Unspecified 04/29/2018  . PFIZER SARS-COV-2 Vaccination 10/21/2019, 11/11/2019    Family History  Problem Relation Age of Onset  . CAD Father   . Heart disease Father   . Lung cancer Other   . Dementia Mother   . Alzheimer's disease Maternal Grandmother   . Cancer Sister   . Diabetes Neg Hx   . Stroke Neg Hx      Current Outpatient Medications:  .  albuterol (PROVENTIL) (2.5 MG/3ML) 0.083% nebulizer solution, Take 3 mLs (2.5 mg total) by nebulization every 6 (six) hours as needed for wheezing or shortness of breath., Disp: 75 mL, Rfl: 12 .  aspirin EC 81 MG tablet, Take 1 tablet (81 mg total) by mouth daily., Disp: 90 tablet, Rfl: 3 .  benzonatate (TESSALON) 100 MG capsule, Take 100 mg by mouth 2 (two) times daily. , Disp: , Rfl: 12 .  budesonide (PULMICORT) 0.25 MG/2ML nebulizer solution, Take 2 mLs (0.25 mg total) by nebulization 2 (two) times daily., Disp: 60 mL, Rfl: 12 .  donepezil (ARICEPT) 10 MG tablet, Take 10 mg by mouth at bedtime., Disp: , Rfl:  .  furosemide (LASIX) 40 MG tablet, Take 1 tablet (40 mg total) by mouth daily., Disp: 30 tablet, Rfl: 0 .  metFORMIN (GLUCOPHAGE) 500 MG tablet, Take 500 mg by mouth 2 (two) times daily., Disp: , Rfl:  .  Multiple Vitamin (MULTIVITAMIN) tablet, Take 1 tablet by mouth daily., Disp: , Rfl:  .  pantoprazole (PROTONIX) 40 MG tablet, Take 40 mg by mouth daily., Disp: , Rfl:  .  Revefenacin (YUPELRI) 175 MCG/3ML SOLN, Inhale 1 vial into the lungs daily., Disp: 90 mL,  Rfl: 11 .  rivastigmine (EXELON) 1.5 MG capsule, TAKE 1 CAPSULE(1.5 MG) BY MOUTH TWICE DAILY, Disp: 60 capsule, Rfl: 6 .  sertraline (ZOLOFT) 50 MG tablet, Take 50 mg by mouth daily., Disp: , Rfl:  .  traZODone (DESYREL) 50 MG  tablet, Take 25 mg by mouth as needed for sleep. , Disp: , Rfl:       Objective:   Vitals:   12/09/19 1015  BP: 130/64  Pulse: 62  SpO2: 100%  Weight: 135 lb (61.2 kg)  Height: 5\' 2"  (1.575 m)    Estimated body mass index is 24.69 kg/m as calculated from the following:   Height as of this encounter: 5\' 2"  (1.575 m).   Weight as of this encounter: 135 lb (61.2 kg).  @WEIGHTCHANGE @  [employer]   12/09/19 1015  Weight: 135 lb (61.2 kg)     Physical Exam Sitting in chair.  Oxygen on.  Barrel chested.  Frail.  Pleasant alert and oriented x3.  Prolonged expiration.  No wheeze abdomen soft no sinus no clubbing no edema.         Assessment:       ICD-10-CM   1. Stage 3 severe COPD by GOLD classification ([place])  J44.9   2. Chronic hypoxemic respiratory failure (HCC)  J96.11        Plan:     Patient Instructions     ICD-10-CM   1. Stage 3 severe COPD by GOLD classification ([place])  J44.9   2. Chronic hypoxemic respiratory failure (HCC)  J96.11     Overall stable  Plan  - continue yupelri and pulmicort nebs  - cotninue o2 24/7  - meet with [facility] to  See if we can get you a innogen portable o2 system  - check alpha 1 AT phenotype - genetic blood test for copd  - check ABG at your conveniene   - if co2 elevated might qualify for night bipap that could help your copd   - will call with results  Followup - 3 months or sooner if needed     SIGNATURE    Dr. Brand Males, M.D., F.C.C.P,  Pulmonary and Critical Care Medicine Staff Physician, [place] Director - Interstitial Lung Disease  Program  Pulmonary [facility] [place], [place], [address]  Pager: [phone], If no answer or between  15:00h - 7:00h: call [phone] Telephone: [phone]  10:41 AM 12/09/2019

## 2019-12-09 NOTE — Addendum Note (Signed)
Addended by: Demetrio Lapping E on: 12/09/2019 11:10 AM   Modules accepted: Orders

## 2019-12-09 NOTE — Patient Instructions (Signed)
ICD-10-CM   1. Stage 3 severe COPD by GOLD classification (HCC)  J44.9   2. Chronic hypoxemic respiratory failure (HCC)  J96.11     Overall stable  Plan  - continue yupelri and pulmicort nebs  - cotninue o2 24/7  - meet with [facility] to  See if we can get you a innogen portable o2 system  - check alpha 1 AT phenotype - genetic blood test for copd  - check ABG at your conveniene   - if co2 elevated might qualify for night bipap that could help your copd   - will call with results  Followup - 3 months or sooner if needed

## 2019-12-09 NOTE — Addendum Note (Signed)
Addended by: Wyvonne Lenz on: 12/09/2019 10:58 AM   Modules accepted: Orders

## 2019-12-12 ENCOUNTER — Inpatient Hospital Stay (HOSPITAL_COMMUNITY): Admission: RE | Admit: 2019-12-12 | Payer: Medicare Other | Source: Ambulatory Visit

## 2019-12-13 DIAGNOSIS — J449 Chronic obstructive pulmonary disease, unspecified: Secondary | ICD-10-CM | POA: Diagnosis not present

## 2019-12-16 ENCOUNTER — Encounter (HOSPITAL_COMMUNITY): Payer: Medicare Other

## 2019-12-18 LAB — ALPHA-1 ANTITRYPSIN PHENOTYPE: A-1 Antitrypsin, Ser: 142 mg/dL (ref 83–199)

## 2019-12-19 ENCOUNTER — Telehealth: Payer: Self-pay | Admitting: Internal Medicine

## 2019-12-19 NOTE — Telephone Encounter (Signed)
Spoke with Beth Kerr. She is aware of results and verbalized understanding.   Nothing further needed at time of call.

## 2019-12-20 ENCOUNTER — Other Ambulatory Visit: Payer: Self-pay

## 2019-12-20 ENCOUNTER — Encounter: Payer: Self-pay | Admitting: Podiatry

## 2019-12-20 ENCOUNTER — Ambulatory Visit (INDEPENDENT_AMBULATORY_CARE_PROVIDER_SITE_OTHER): Payer: Medicare Other | Admitting: Podiatry

## 2019-12-20 VITALS — Temp 96.5°F

## 2019-12-20 DIAGNOSIS — E119 Type 2 diabetes mellitus without complications: Secondary | ICD-10-CM

## 2019-12-20 DIAGNOSIS — B351 Tinea unguium: Secondary | ICD-10-CM

## 2019-12-20 DIAGNOSIS — M79676 Pain in unspecified toe(s): Secondary | ICD-10-CM

## 2019-12-20 NOTE — Progress Notes (Signed)

## 2019-12-23 DIAGNOSIS — H6121 Impacted cerumen, right ear: Secondary | ICD-10-CM | POA: Diagnosis not present

## 2019-12-25 ENCOUNTER — Encounter (HOSPITAL_COMMUNITY): Payer: Medicare Other

## 2019-12-27 DIAGNOSIS — J449 Chronic obstructive pulmonary disease, unspecified: Secondary | ICD-10-CM | POA: Diagnosis not present

## 2020-01-06 DIAGNOSIS — J449 Chronic obstructive pulmonary disease, unspecified: Secondary | ICD-10-CM | POA: Diagnosis not present

## 2020-01-13 ENCOUNTER — Other Ambulatory Visit: Payer: Self-pay

## 2020-01-13 ENCOUNTER — Ambulatory Visit (HOSPITAL_COMMUNITY)
Admission: RE | Admit: 2020-01-13 | Discharge: 2020-01-13 | Disposition: A | Discharge status: Home / Self Care | Payer: Medicare Other | Source: Ambulatory Visit | Attending: Internal Medicine | Admitting: Internal Medicine

## 2020-01-13 DIAGNOSIS — J449 Chronic obstructive pulmonary disease, unspecified: Secondary | ICD-10-CM | POA: Diagnosis not present

## 2020-01-13 LAB — BLOOD GAS, ARTERIAL
Acid-Base Excess: 6.7 mmol/L — ABNORMAL HIGH (ref 0.0–2.0)
Bicarbonate: 31.8 mmol/L — ABNORMAL HIGH (ref 20.0–28.0)
Drawn by: 244901
FIO2: 28
O2 Saturation: 92.2 %
Patient temperature: 37
pCO2 arterial: 56.6 mmHg — ABNORMAL HIGH (ref 32.0–48.0)
pH, Arterial: 7.369 (ref 7.350–7.450)
pO2, Arterial: 63.1 mmHg — ABNORMAL LOW (ref 83.0–108.0)

## 2020-02-06 DIAGNOSIS — J449 Chronic obstructive pulmonary disease, unspecified: Secondary | ICD-10-CM | POA: Diagnosis not present

## 2020-02-10 DIAGNOSIS — N189 Chronic kidney disease, unspecified: Secondary | ICD-10-CM | POA: Diagnosis not present

## 2020-02-10 DIAGNOSIS — E559 Vitamin D deficiency, unspecified: Secondary | ICD-10-CM | POA: Diagnosis not present

## 2020-02-10 DIAGNOSIS — E119 Type 2 diabetes mellitus without complications: Secondary | ICD-10-CM | POA: Diagnosis not present

## 2020-03-18 ENCOUNTER — Other Ambulatory Visit: Payer: Self-pay

## 2020-03-18 ENCOUNTER — Ambulatory Visit: Payer: Medicare Other

## 2020-03-18 DIAGNOSIS — I6523 Occlusion and stenosis of bilateral carotid arteries: Secondary | ICD-10-CM | POA: Diagnosis not present

## 2020-03-19 ENCOUNTER — Other Ambulatory Visit: Payer: Self-pay | Admitting: Cardiology

## 2020-03-19 DIAGNOSIS — I6523 Occlusion and stenosis of bilateral carotid arteries: Secondary | ICD-10-CM

## 2020-03-23 ENCOUNTER — Ambulatory Visit: Payer: Medicare Other | Admitting: Internal Medicine

## 2020-03-25 ENCOUNTER — Ambulatory Visit: Payer: Medicare Other | Admitting: Cardiology

## 2020-03-25 ENCOUNTER — Other Ambulatory Visit: Payer: Self-pay

## 2020-03-25 ENCOUNTER — Ambulatory Visit: Payer: Medicare Other | Admitting: Podiatry

## 2020-03-25 ENCOUNTER — Other Ambulatory Visit: Payer: Self-pay | Admitting: Cardiology

## 2020-03-25 ENCOUNTER — Encounter: Payer: Self-pay | Admitting: Cardiology

## 2020-03-25 VITALS — BP 149/72 | HR 96 | Ht 62.0 in | Wt 134.2 lb

## 2020-03-25 DIAGNOSIS — J432 Centrilobular emphysema: Secondary | ICD-10-CM | POA: Diagnosis not present

## 2020-03-25 DIAGNOSIS — I10 Essential (primary) hypertension: Secondary | ICD-10-CM

## 2020-03-25 DIAGNOSIS — N1832 Chronic kidney disease, stage 3b: Secondary | ICD-10-CM | POA: Diagnosis not present

## 2020-03-25 DIAGNOSIS — I5032 Chronic diastolic (congestive) heart failure: Secondary | ICD-10-CM | POA: Diagnosis not present

## 2020-03-25 DIAGNOSIS — I6523 Occlusion and stenosis of bilateral carotid arteries: Secondary | ICD-10-CM

## 2020-03-25 DIAGNOSIS — E7800 Pure hypercholesterolemia, unspecified: Secondary | ICD-10-CM

## 2020-03-25 MED ORDER — ROSUVASTATIN CALCIUM 5 MG PO TABS: 5.0000 mg | ORAL_TABLET | Freq: Every day | ORAL | 3 refills | Status: AC

## 2020-03-25 MED ORDER — AMLODIPINE BESYLATE 5 MG PO TABS: 5.0000 mg | ORAL_TABLET | Freq: Every day | ORAL | 0 refills | Status: DC

## 2020-03-25 NOTE — Progress Notes (Signed)
Primary Physician/Referring:  Jani Gravel, MD  Patient ID: Beth Kerr, female    DOB: July 12, 1941, 79 y.o.   MRN: [id number]  Chief Complaint  Patient presents with  . Carotid Stenosis  . Follow-up   HPI:    HPI: Beth Kerr  is a 79 y.o. asymptomatic carotid artery stenosis, diastolic CHF, hypertension, uncontrolled diabetes mellitus, hyperlipidemia and prior history of tobacco use disorder and severe emphysema and on continuous home O2. She now presents for 6 month follow up visit for hypertension and carotid stenosis.  With the past 6 months she has been off of her antihypertensive medication, states that she has not had any problems. Except for chronic dyspnea she has no other specific complaints today.  Denies leg edema, PND or orthopnea.  Past Medical History:  Diagnosis Date  . Diabetes mellitus without complication ([place])   . Emphysema/COPD ([place])   . Hyperlipidemia   . Hypertension    Past Surgical History:  Procedure Laterality Date  . DILATION AND CURETTAGE OF UTERUS    . TONSILLECTOMY     Social History   Tobacco Use  . Smoking status: Former Smoker    Packs/day: 2.00    Years: 28.00    Pack years: 56.00    Types: Cigarettes    Quit date: 12/1995    Years since quitting: 24.2  . Smokeless tobacco: Never Used  Substance Use Topics  . Alcohol use: No   Marital Status: Divorced  Lives with her grandson Hart Carwin and her daughter in [place].  ROS  Review of Systems  Cardiovascular: Positive for dyspnea on exertion and leg swelling.  Respiratory: Positive for cough.   Musculoskeletal: Positive for back pain, joint pain and muscle cramps.   Objective  Blood pressure (!) 149/72, pulse 96, height '5\' 2"'  (1.575 m), weight 134 lb 3.2 oz (60.9 kg), SpO2 90 %. Body mass index is 24.55 kg/m.   Vitals with BMI 03/25/2020 12/09/2019 07/18/2019  Height '5\' 2"'  '5\' 2"'  '5\' 2"'   Weight 134 lbs 3 oz 135 lbs 135 lbs 6 oz  BMI 24.54 91.69 45.03  Systolic 888 280 034    Diastolic 72 64 56  Pulse 96 62 85    Physical Exam Constitutional:      General: She is not in acute distress.    Appearance: She is well-developed.  Neck:     Thyroid: No thyromegaly.     Vascular: No JVD.  Cardiovascular:     Rate and Rhythm: Normal rate and regular rhythm.     Pulses: Normal pulses and intact distal pulses.          Carotid pulses are on the right side with bruit and on the left side with bruit.    Heart sounds: Normal heart sounds. No murmur heard.  No gallop.      Comments: Bilateral trace edema present. Pulmonary:     Effort: Pulmonary effort is normal.     Breath sounds: No wheezing or rales.  Abdominal:     General: Bowel sounds are normal.     Palpations: Abdomen is soft.  Neurological:     Mental Status: She is alert.    Laboratory examination:   External labs:  Cholesterol, total 281.000 M 05/31/2019 HDL 70.000 MG 05/31/2019 LDL-C 172.000 05/31/2019 Triglycerides 211.000 05/31/2019  A1C 6.900 % 09/24/2019  Creatinine, Serum 1.170 MG/ 09/24/2019 Potassium 4.100 12/20/2017 ALT (SGPT) 10.000 IU/ 09/24/2019  02/28/2018: Creatinine 1.14, potassium 3.6, sodium 147, EGFR 36, CMP otherwise normal.  02/16/2018: RBC 3.3, hemoglobin 10.4, hematocrit 34.3, MCV 103, CBC otherwise normal.  Hemoglobin A1c 6%.  Cholesterol 131, triglycerides 170, HDL 52, LDL 45.   Medications   Current Meds  Medication Sig  . albuterol (PROVENTIL) (2.5 MG/3ML) 0.083% nebulizer solution Take 3 mLs (2.5 mg total) by nebulization every 6 (six) hours as needed for wheezing or shortness of breath.  [name] aspirin EC 81 MG tablet Take 1 tablet (81 mg total) by mouth daily.  . benzonatate (TESSALON) 100 MG capsule Take 100 mg by mouth 2 (two) times daily.   . budesonide (PULMICORT) 0.25 MG/2ML nebulizer solution Take 2 mLs (0.25 mg total) by nebulization 2 (two) times daily.  [name] donepezil (ARICEPT) 10 MG tablet Take 10 mg by mouth at bedtime.  . furosemide (LASIX) 40 MG tablet Take 1  tablet (40 mg total) by mouth daily.  . metFORMIN (GLUCOPHAGE) 500 MG tablet Take 500 mg by mouth 2 (two) times daily.  . Multiple Vitamin (MULTIVITAMIN) tablet Take 1 tablet by mouth daily.  . Revefenacin (YUPELRI) 175 MCG/3ML SOLN Inhale 1 vial into the lungs daily.  . rivastigmine (EXELON) 1.5 MG capsule TAKE 1 CAPSULE(1.5 MG) BY MOUTH TWICE DAILY  . sertraline (ZOLOFT) 50 MG tablet Take 50 mg by mouth daily.  . traZODone (DESYREL) 50 MG tablet Take 25 mg by mouth as needed for sleep.     Meds ordered this encounter  Medications  . rosuvastatin (CRESTOR) 5 MG tablet    Sig: Take 1 tablet (5 mg total) by mouth daily.    Dispense:  90 tablet    Refill:  3  . DISCONTD: amLODipine (NORVASC) 5 MG tablet    Sig: Take 1 tablet (5 mg total) by mouth daily.    Dispense:  30 tablet    Refill:  0   Cardiac Studies:   Lexiscan stress 09/21/12: Resting EKG NSR, Poor R wave progression. Stress EKG was non diagnostic for ischemia. No ST-T changes of ischemia noted with pharmacologic stress testing. Stress symptoms included SHORTNESS OF BREATH, HEADACHE AND CHEST PRESSURE. Stress terminated due to completion of protocol. Normal perfusion without ischemia. LVEF 77%.  Echo 01/28/2017 at [facility]:  Left ventricle: The cavity size was normal. Wall thickness was normal. Systolic function was normal. The estimated ejection fraction was in the range of 55% to 60%. Wall motion was normal; there were no regional wall motion abnormalities. Features are consistent with a pseudonormal left ventricular filling pattern, with concomitant abnormal relaxation and increased filling pressure (grade 2 diastolic dysfunction).  Mitral valve: Calcified annulus.Right ventricle: The cavity size was mildly dilated.  Carotid artery duplex07/21/2021:  Stenosis in the right internal carotid artery (16-49%).  Stenosis in the left internal carotid artery (16-49%). Mild stenosis in the left external carotid artery (<50%).    Antegrade right vertebral artery flow. Antegrade left vertebral artery flow.  Compared to the study done on 03/18/2019, no change. Follow up in one year is appropriate if clinically indicated.  EKG:   EKG 03/25/2020: Normal sinus rhythm at the rate of 95 bpm, normal axis, right bundle branch block.  No evidence of ischemia.  No significant change from 03/20/2018.  Assessment     ICD-10-CM   1. Chronic diastolic (congestive) heart failure (HCC)  I50.32 EKG 12-Lead  2. Asymptomatic bilateral carotid artery stenosis  I65.23 rosuvastatin (CRESTOR) 5 MG tablet  3. Centrilobular emphysema ([place])  J43.2   4. Stage 3b chronic kidney disease  N18.32   5. Primary hypertension  I10  DISCONTINUED: amLODipine (NORVASC) 5 MG tablet  6. Hypercholesteremia  E78.00      Recommendations:   VANITY LARSSON  is a 79 y.o.  asymptomatic carotid artery stenosis, diastolic CHF, hypertension, uncontrolled diabetes mellitus, hyperlipidemia and prior history of tobacco use disorder and severe emphysema and on continuous home O2. She now presents for 6 month follow up visit for hypertension and carotid stenosis.  Patient presents for follow-up of asymptomatic bilateral carotid artery stenosis, dyspnea and chronic diastolic heart failure.  She is presently not on any blood pressure medications.  I have started her on amlodipine 5 mg daily, there were no refills.  If blood pressure is well controlled, she has an appointment to see her PCP in 3 to 4 weeks, she will request for 90-day refills.  She is now tolerating Crestor for secondary prevention, without any side effects.  She has responded well with very low-dose of Crestor, she does need follow-up lipid status.  Lipids are now being managed by her PCP.  In view of stage III chronic kidney disease, I still would recommend keeping her blood pressure to at least closer to 130 mmHg.  Dyspnea is remained stable, no clinical evidence of heart failure, no significant change  in EKG.  I will see her back in a year with continued surveillance of her mild asymptomatic bilateral carotid artery stenosis.   Adrian Prows, MD, [facility] 03/28/2020, 5:31 PM Office: [phone]

## 2020-04-13 DIAGNOSIS — E785 Hyperlipidemia, unspecified: Secondary | ICD-10-CM | POA: Diagnosis not present

## 2020-04-13 DIAGNOSIS — J449 Chronic obstructive pulmonary disease, unspecified: Secondary | ICD-10-CM | POA: Diagnosis not present

## 2020-04-13 DIAGNOSIS — R4189 Other symptoms and signs involving cognitive functions and awareness: Secondary | ICD-10-CM | POA: Diagnosis not present

## 2020-04-13 DIAGNOSIS — E119 Type 2 diabetes mellitus without complications: Secondary | ICD-10-CM | POA: Diagnosis not present

## 2020-04-29 ENCOUNTER — Ambulatory Visit: Payer: Medicare Other | Admitting: Internal Medicine

## 2020-04-29 ENCOUNTER — Other Ambulatory Visit: Payer: Self-pay

## 2020-04-29 ENCOUNTER — Encounter: Payer: Self-pay | Admitting: Internal Medicine

## 2020-04-29 VITALS — BP 132/80 | HR 90 | Temp 97.9°F | Ht 62.0 in | Wt 142.0 lb

## 2020-04-29 DIAGNOSIS — J9611 Chronic respiratory failure with hypoxia: Secondary | ICD-10-CM | POA: Diagnosis not present

## 2020-04-29 DIAGNOSIS — J449 Chronic obstructive pulmonary disease, unspecified: Secondary | ICD-10-CM | POA: Diagnosis not present

## 2020-04-29 DIAGNOSIS — J9612 Chronic respiratory failure with hypercapnia: Secondary | ICD-10-CM

## 2020-04-29 NOTE — Progress Notes (Signed)
RA O2 sats 87% completed for check in. Placed back on 2 L/min [place] with sats increase to 93%

## 2020-04-29 NOTE — Progress Notes (Signed)
IOV 11/17/2017  Chief Complaint  Patient presents with  . Consult    Self referral for COPD.  Pt diagnosed with COPD 01/2017. States she fell and after 5 days of laying in the floor after fall, was taken to the hospital 01/27/18 and after 5 days in hosp. was taken to rehab x3 weeks. Pt denies any real complaints of cough or SOB. Has some midsternal pain that has been happening x3-4 weeks. Pt usually wears O2 24/7 2-4L.   Beth Kerr [facility] 79 y.o. with [address] [place] [address] self referred for copd.  History is gained from talking to her grandson, talking to her and review of the chart.  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  She was admitted for hypercapnic respiratory failure and intubated.  She is about that admission and then discharged.  Prior to that she only had a diagnosis of COPD based on pulmonary function test but she was not taking any inhalers.  After that discharge she went on oxygen for the first time and has convalesced at home and according to her and her grandson doing fairly well.  She finds the use of oxygen very inconvenient and often does not like to use it.  She does several activities of daily living including cooking and changing beds and changing her clothes.  She does this without oxygen.  She is not on any maintenance inhalers.  She followed up with her primary care physician and was asked to come in to reestablish with pulmonary.  Although she has never seen pulmonary in the office she is only seen [name] in the critical care side.  She might have to take inhalers despite she is surprised that feeling subjectively well.  She is surprised that she still needs to be on oxygen.  Today on room air at rest after being off oxygen for 1 hour her pulse ox was 90% but she desaturated at 30 yard mark according to my medical assistant.  Currently overall she feels fine.  COPD CAT score is 15 and reflects a baseline. Walking desaturation tst  - 185 feet x 3 laps opn RA - at half lap desaturated to 86% and stayed good at 2L [place] (pulse ox 90% roomn air at rest 90%)     04/25/2018  3 Month follow up: Pt. Presents for follow up. She states she is not compliant with her Stialto. It is too hard to use. She has a hard time coordinating it. She iscompliant withher oxygen at 2 L [place] at all times. She states she monitors her sats and they average about 90%.She is not using her Duonebs and she states she has not used them in about 1 year.She states she is not coughing anything up at all. She is taking tessalon perles for cough twice daily. She does not have a rescue inhaler to use for intermittent wheezing. She states she does not have much use for any complicated medications. She denies any fever, chest pain, orthopnea or hemoptysis. She is planning on getting her flu vaccine through her PCP.   Test Results: Results for Beth, Kerr (MRN [id number]) as of 11/17/2017 16:49  Ref. Range 06/30/2016 10:42  FEV1-Post Latest Units: L 0.81  FEV1-%Pred-Post Latest Units: % 45  FEV1-%Change-Post Latest Units: % 5  Post FEV1/FVC ratio Latest Units: % 44  Results for Beth, Kerr (MRN [id number]) as of 11/17/2017 16:49  Ref. Range 06/30/2016 10:42  DLCO unc Latest Units: ml/min/mmHg 12.64  DLCO unc % pred Latest Units: % 62   MPRESSION CT chest: 01/28/2017 1. Negative for acute pulmonary embolus. 2. Small focal consolidation in the lingula may reflect atelectasis or small infiltrate. There are trace effusions 3. Mild emphysema 4. Indeterminate 13 mm left adrenal gland nodule partially visualized. Diffuse nodularity of the right adrenal gland. Further evaluation with adrenal CT is suggested.  Arterial Blood Gas Results Results for Beth, Kerr (MRN [id number]) as of 11/17/2017 16:49  Ref. Range 01/28/2017 05:45 01/28/2017 10:40 01/28/2017 12:44 01/28/2017 17:47 01/29/2017 04:10  pH, Arterial Latest Ref Range: 7.350 - 7.450  7.205 (L) 7.152 (LL)  7.270 (L) 7.335 (L) 7.366  pCO2 arterial Latest Ref Range: 32.0 - 48.0 mmHg 82.9 (HH) 91.5 (HH) 65.4 (HH) 55.8 (H) 48.2 (H)  pO2, Arterial Latest Ref Range: 83.0 - 108.0 mmHg 397 (H) 275 (H) 96.1 83.7 123 (H)   ECHO June 2018 - gr 2 diastolic dysf   OV 07/18/2019  Subjective:  Patient ID: Beth Kerr, female , DOB: [birth year] , age 79 y.o. , MRN: [id number] , ADDRESS: [address] [place] [address]   07/18/2019 -   Chief Complaint  Patient presents with  . Follow-up    Pt states she has been doing okay. Pt states she does have problems with her breathing at rest and also with exertion. Pt is on 2L 24/7. Pt does have an occ cough. Denies any real complaints of chest discomfort.   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 -presents for follow-up.  Last visit was in August 2019.  After that have not seen her.  She now comes with the grand daughter-in-law.  She is now moved in with her grandson because she was living alone and falling.  Is been several months since she took any of her inhalers.  Also she not able to use inhalers because of continued frail health and older age.  She is mostly ECOG 4.  They are asking for a change to nebulizers.  The grand daughter-in-law notices that without oxygen at rest the pulse ox desaturates into the 70s with 2 L it corrects.  There is no history of any exacerbations recently.  The main purpose of this visit is to get nebulizers.  We went over different options.  ROS - per HPI  OV 12/09/2019  Subjective:  Patient ID: Beth Kerr, female , DOB: [birth year] , age 79 y.o. , MRN: [id number] , ADDRESS: [address] [place] [address]   12/09/2019 -   Chief Complaint  Patient presents with  . Follow-up    Pt states she has been doing good since last visit and denies any real concerns. Pt's granddaughter states at times if pt's O2 gets kinked, pt's sats will drop to the low 70s but  then once they fix the O2, pt's sats will go back up quick.   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 -follow-up COPD.  Presents with her daughter-in-law [name].  Of note the paternal aunt of the daughter-in-law is Caesar Chestnut the [employer], ICU nurse.  Overall reported a stable oxygen continue COPD CAT score is 21.  Takes long-acting anticholinergic nebulizer daily.  Pulmicort nebulizer daily but only once a day.  Patient feels she can go without oxygen for a few hours but the daughter-in-law says she desaturates easily.  They want  a lighter portable oxygen system that last longer.  The current tank only last 45 minutes.  She is not able to go to church overall ECOG is 3-4.  Last blood gas test was in 2018.  Saw the nurse practitioner in December 2020 for COPD exacerbation.    OV 04/29/2020   Subjective:  Patient ID: Beth Kerr, female , DOB: 1940/09/06, age 4 y.o. years. , MRN: [id number],  ADDRESS: [address] [place] [address] PCP  [name], MD Providers : Treatment Team:  Attending Provider: Kalman Shan, MD   Chief Complaint  Patient presents with  . Follow-up    reports breathing "okay" lately. reports last used nebulizer x2 days ago    Gold stage III COPD with chronic hypoxemic and hypercapnic respiratory failure.  This is a face-to-face visit.   HPI Beth Kerr 79 y.o. -presents for routine follow-up.  Last visit was in spring 2021.  She had alpha 1 antitrypsin test.  Phenotype was MM levels normal.  She had a blood gas that shows chronic hypercapnia with PCO2 56-57.  This is elevated.  She is not on nocturnal noninvasive ventilation.  She is on oxygen and nebulizers.  She is stable.  She is had a Covid vaccine.  Her close friend is Beth Kerr who is also a patient of mine.  She indicated to me that this patient is now on hospice and is in terminal care.  She has had many blood gases all of them show  chronic hypercapnia including most recently in May 2021.  Her pulmonary function test shows advanced COPD.  Results for Beth Kerr, Beth Kerr (MRN [id number]) as of 04/29/2020 12:04  Ref. Range 01/28/2017 10:40 01/28/2017 12:44 01/28/2017 17:47 01/29/2017 04:10 01/13/2020 11:27  pH, Arterial Latest Ref Range: 7.35 - 7.45  7.152 (LL) 7.270 (L) 7.335 (L) 7.366 7.369  pCO2 arterial Latest Ref Range: 32 - 48 mmHg 91.5 (HH) 65.4 (HH) 55.8 (H) 48.2 (H) 56.6 (H)  pO2, Arterial Latest Ref Range: 83 - 108 mmHg 275 (H) 96.1 83.7 123 (H) 63.1 (L)     CAT Score 04/29/2020 12/09/2019  Total CAT Score 10 21      CAT COPD Symptom & Quality of Life Score (GSK trademark) 0 is no burden. 5 is highest burden 11/17/2017  07/18/2019   Never Cough -> Cough all the time 1 2  No phlegm in chest -> Chest is full of phlegm 0 0  No chest tightness -> Chest feels very tight 0 3  No dyspnea for 1 flight stairs/hill -> Very dyspneic for 1 flight of stairs 3 - wheel chair  No limitations for ADL at home -> Very limited with ADL at home 3 3  Confident leaving home -> Not at all confident leaving home 5 2  Sleep soundly -> Do not sleep soundly because of lung condition 0 1  Lots of Energy -> No energy at all 3 3  TOTAL Score (max 40)  15 14   Results for Beth Kerr, Beth Kerr (MRN [id number]) as of 04/29/2020 12:04  Ref. Range 01/13/2020 11:27  FIO2 Unknown 28.00  pH, Arterial Latest Ref Range: 7.35 - 7.45  7.369  pCO2 arterial Latest Ref Range: 32 - 48 mmHg 56.6 (H)  pO2, Arterial Latest Ref Range: 83 - 108 mmHg 63.1 (L)    A-1 Antitrypsin, Ser 83 - 199 mg/dL 662   HUTML-4-[id number] (AAT) PHENOTYPE  SEE NOTE   Comment: THIS PATIENT'S ALPHA-1-ANTITRYPSIN PHENOTYPE IS PI*MM  PFT Results Latest Ref Rng & Units 06/30/2016  FVC-Pre L 1.61  FVC-Predicted Pre % 67  FVC-Post L 1.84  FVC-Predicted Post % 77  Pre FEV1/FVC % % 48  Post FEV1/FCV % % 44  FEV1-Pre L 0.77  FEV1-Predicted Pre % 43  FEV1-Post L 0.81  DLCO uncorrected  ml/min/mmHg 12.64  DLCO UNC% % 62  DLVA Predicted % 81  TLC L 5.43  TLC % Predicted % 118  RV % Predicted % 155      has a past medical history of Diabetes mellitus without complication (HCC), Emphysema/COPD (HCC), Hyperlipidemia, and Hypertension.   reports that she quit smoking about 24 years ago. Her smoking use included cigarettes. She has a 56.00 pack-year smoking history. She has never used smokeless tobacco.  Past Surgical History:  Procedure Laterality Date  . DILATION AND CURETTAGE OF UTERUS    . TONSILLECTOMY      Allergies  Allergen Reactions  . Demerol [Meperidine] Anaphylaxis    Immunization History  Administered Date(s) Administered  . Influenza, High Dose Seasonal PF 04/25/2017, 04/30/2019  . Influenza-Unspecified 04/29/2018  . PFIZER SARS-COV-2 Vaccination 10/21/2019, 11/11/2019    Family History  Problem Relation Age of Onset  . CAD Father   . Heart disease Father   . Lung cancer Other   . Dementia Mother   . Alzheimer's disease Maternal Grandmother   . Cancer Sister   . Diabetes Neg Hx   . Stroke Neg Hx      Current Outpatient Medications:  .  albuterol (PROVENTIL) (2.5 MG/3ML) 0.083% nebulizer solution, Take 3 mLs (2.5 mg total) by nebulization every 6 (six) hours as needed for wheezing or shortness of breath., Disp: 75 mL, Rfl: 12 .  amLODipine (NORVASC) 5 MG tablet, TAKE 1 TABLET(5 MG) BY MOUTH DAILY, Disp: 90 tablet, Rfl: 3 .  aspirin EC 81 MG tablet, Take 1 tablet (81 mg total) by mouth daily., Disp: 90 tablet, Rfl: 3 .  benzonatate (TESSALON) 100 MG capsule, Take 100 mg by mouth 2 (two) times daily. , Disp: , Rfl: 12 .  budesonide (PULMICORT) 0.25 MG/2ML nebulizer solution, Take 2 mLs (0.25 mg total) by nebulization 2 (two) times daily., Disp: 60 mL, Rfl: 12 .  furosemide (LASIX) 40 MG tablet, Take 1 tablet (40 mg total) by mouth daily., Disp: 30 tablet, Rfl: 0 .  metFORMIN (GLUCOPHAGE) 500 MG tablet, Take 500 mg by mouth 2 (two) times  daily., Disp: , Rfl:  .  Multiple Vitamin (MULTIVITAMIN) tablet, Take 1 tablet by mouth daily., Disp: , Rfl:  .  Revefenacin (YUPELRI) 175 MCG/3ML SOLN, Inhale 1 vial into the lungs daily., Disp: 90 mL, Rfl: 11 .  rivastigmine (EXELON) 1.5 MG capsule, TAKE 1 CAPSULE(1.5 MG) BY MOUTH TWICE DAILY, Disp: 60 capsule, Rfl: 6 .  rosuvastatin (CRESTOR) 5 MG tablet, Take 1 tablet (5 mg total) by mouth daily., Disp: 90 tablet, Rfl: 3 .  sertraline (ZOLOFT) 50 MG tablet, Take 50 mg by mouth daily., Disp: , Rfl:  .  traZODone (DESYREL) 50 MG tablet, Take 25 mg by mouth as needed for sleep. , Disp: , Rfl:       Objective:   Vitals:   04/29/20 1150  BP: 132/80  Pulse: 90  Temp: 97.9 F (36.6 C)  SpO2: (!) 87%  Weight: 142 lb (64.4 kg)  Height: 5\' 2"  (1.575 m)   87% RA - corrected with 2L [place]   Estimated body mass index is 25.97 kg/m as calculated from the  following:   Height as of this encounter: 5\' 2"  (1.575 m).   Weight as of this encounter: 142 lb (64.4 kg).  @WEIGHTCHANGE @    04/29/20 1150  Weight: 142 lb (64.4 kg)     Physical Exam Elderly frail lady.  Has a mask on.  With the mask removed oral cavity shows no thrush.  Barrel chested clear to auscultation bilaterally no wheeze no crackles.  Normal heart sounds.  Abdomen soft.  Mild chronic venous stasis edema with varicosities.  Alert and oriented x3 no elevated JVP or neck nodes.  She looks frail.    Granddaughter in law at her side.      Assessment:       ICD-10-CM   1. Stage 3 severe COPD by GOLD classification (HCC)  J44.9   2. Chronic respiratory failure with hypoxia and hypercapnia (HCC)  J96.11    J96.12    Beth Kerr  was set up for an ABG done on below date and the results showed showed chronic hypercarbia at all times as below  Results for Beth Kerr, Beth Kerr (MRN Beth Kerr) as of 04/29/2020 12:04  Ref. Range 01/28/2017 10:40 01/28/2017 12:44 01/28/2017 17:47 01/29/2017 04:10 01/13/2020 11:27  pH, Arterial  Latest Ref Range: 7.35 - 7.45  7.152 (LL) 7.270 (L) 7.335 (L) 7.366 7.369  pCO2 arterial Latest Ref Range: 32 - 48 mmHg 91.5 (HH) 65.4 (HH) 55.8 (H) 48.2 (H) 56.6 (H)  pO2, Arterial Latest Ref Range: 83 - 108 mmHg 275 (H) 96.1 83.7 123 (H) 63.1 (L)    Beth Kerr has  chronic respiratory failure is due to  Very severe COPD  That  is life threatening.  Previous ABG's have documented high PCO2 and spirometry reveals very severe obstructive ventilatory defect c/w GOLD STAGE thanks 3 -  COPD.   Patient WILL benefit from non-invasive ventilation.  Without this therapy, the patient is at high risk of ending up with worsening symptoms, worsened respiratory failure, need for ER visits and/or recurrent hospitalizations.  Bilevel device unable to adequately support patient's nocturnal ventilation needs.  Patient would benefit from NIV therapy with set tidal volumes and pressure.      Plan:     Patient Instructions     ICD-10-CM   1. Stage 3 severe COPD by GOLD classification (HCC)  J44.9   2. Chronic respiratory failure with hypoxia and hypercapnia (HCC)  J96.11    J96.12      Overall stable but CO2 levels always high  Plan  - continue yupelri and pulmicort nebs  - cotninue o2 24/7 - start Trilogy BiPAP machine at night daily  - need to do paper work to get started - Our thoughts and prayers for your friend Ms Kerr  Followup - 3 months or sooner if needed - can see APP     SIGNATURE    Dr. 03/31/2017, M.D., F.C.C.P,  Pulmonary and Critical Care Medicine Staff Physician, [facility] Director - Interstitial Lung Disease  Program  Pulmonary Fibrosis [facility] at [facility] [place], [facility], [place]  Pager: [phone], If no answer or between  15:00h - 7:00h: call [phone] Telephone: [phone]  12:22 PM 04/29/2020

## 2020-04-29 NOTE — Patient Instructions (Addendum)
ICD-10-CM   1. Stage 3 severe COPD by GOLD classification (HCC)  J44.9   2. Chronic respiratory failure with hypoxia and hypercapnia (HCC)  J96.11    J96.12      Overall stable but CO2 levels always high  Plan  - continue yupelri and pulmicort nebs  - cotninue o2 24/7 - start Trilogy BiPAP machine at night daily  - need to do paper work to get started - Our thoughts and prayers for your friend Ms Page  Followup - 3 months or sooner if needed - can see APP

## 2020-05-18 ENCOUNTER — Other Ambulatory Visit: Payer: Self-pay | Admitting: Internal Medicine

## 2020-06-03 DIAGNOSIS — E785 Hyperlipidemia, unspecified: Secondary | ICD-10-CM | POA: Diagnosis not present

## 2020-06-03 DIAGNOSIS — N390 Urinary tract infection, site not specified: Secondary | ICD-10-CM | POA: Diagnosis not present

## 2020-06-03 DIAGNOSIS — E119 Type 2 diabetes mellitus without complications: Secondary | ICD-10-CM | POA: Diagnosis not present

## 2020-06-03 DIAGNOSIS — R946 Abnormal results of thyroid function studies: Secondary | ICD-10-CM | POA: Diagnosis not present

## 2020-06-03 DIAGNOSIS — I10 Essential (primary) hypertension: Secondary | ICD-10-CM | POA: Diagnosis not present

## 2020-06-03 DIAGNOSIS — Z0000 Encounter for general adult medical examination without abnormal findings: Secondary | ICD-10-CM | POA: Diagnosis not present

## 2020-06-10 DIAGNOSIS — I129 Hypertensive chronic kidney disease with stage 1 through stage 4 chronic kidney disease, or unspecified chronic kidney disease: Secondary | ICD-10-CM | POA: Diagnosis not present

## 2020-06-10 DIAGNOSIS — Z0000 Encounter for general adult medical examination without abnormal findings: Secondary | ICD-10-CM | POA: Diagnosis not present

## 2020-06-10 DIAGNOSIS — E1122 Type 2 diabetes mellitus with diabetic chronic kidney disease: Secondary | ICD-10-CM | POA: Diagnosis not present

## 2020-06-10 DIAGNOSIS — J449 Chronic obstructive pulmonary disease, unspecified: Secondary | ICD-10-CM | POA: Diagnosis not present

## 2020-06-26 ENCOUNTER — Ambulatory Visit: Payer: Medicare Other | Admitting: Podiatry

## 2020-12-28 ENCOUNTER — Other Ambulatory Visit: Payer: Self-pay

## 2020-12-28 ENCOUNTER — Ambulatory Visit (INDEPENDENT_AMBULATORY_CARE_PROVIDER_SITE_OTHER): Payer: Medicare Other | Admitting: Podiatry

## 2020-12-28 ENCOUNTER — Encounter: Payer: Self-pay | Admitting: Podiatry

## 2020-12-28 DIAGNOSIS — B351 Tinea unguium: Secondary | ICD-10-CM | POA: Diagnosis not present

## 2020-12-28 DIAGNOSIS — M79676 Pain in unspecified toe(s): Secondary | ICD-10-CM

## 2020-12-28 DIAGNOSIS — E119 Type 2 diabetes mellitus without complications: Secondary | ICD-10-CM

## 2020-12-28 NOTE — Progress Notes (Signed)

## 2021-01-07 ENCOUNTER — Other Ambulatory Visit: Payer: Self-pay

## 2021-01-07 ENCOUNTER — Encounter: Payer: Self-pay | Admitting: Family Medicine

## 2021-01-07 ENCOUNTER — Ambulatory Visit (INDEPENDENT_AMBULATORY_CARE_PROVIDER_SITE_OTHER): Payer: Medicare Other | Admitting: Family Medicine

## 2021-01-07 VITALS — BP 130/66 | HR 97 | Temp 97.6°F | Ht 60.5 in | Wt 134.4 lb

## 2021-01-07 DIAGNOSIS — I129 Hypertensive chronic kidney disease with stage 1 through stage 4 chronic kidney disease, or unspecified chronic kidney disease: Secondary | ICD-10-CM

## 2021-01-07 DIAGNOSIS — I6523 Occlusion and stenosis of bilateral carotid arteries: Secondary | ICD-10-CM | POA: Insufficient documentation

## 2021-01-07 DIAGNOSIS — N1832 Chronic kidney disease, stage 3b: Secondary | ICD-10-CM | POA: Diagnosis not present

## 2021-01-07 DIAGNOSIS — E1122 Type 2 diabetes mellitus with diabetic chronic kidney disease: Secondary | ICD-10-CM

## 2021-01-07 DIAGNOSIS — I5032 Chronic diastolic (congestive) heart failure: Secondary | ICD-10-CM | POA: Diagnosis not present

## 2021-01-07 DIAGNOSIS — M8580 Other specified disorders of bone density and structure, unspecified site: Secondary | ICD-10-CM | POA: Insufficient documentation

## 2021-01-07 DIAGNOSIS — E559 Vitamin D deficiency, unspecified: Secondary | ICD-10-CM | POA: Insufficient documentation

## 2021-01-07 DIAGNOSIS — N189 Chronic kidney disease, unspecified: Secondary | ICD-10-CM

## 2021-01-07 DIAGNOSIS — E785 Hyperlipidemia, unspecified: Secondary | ICD-10-CM | POA: Diagnosis not present

## 2021-01-07 DIAGNOSIS — F32A Depression, unspecified: Secondary | ICD-10-CM | POA: Insufficient documentation

## 2021-01-07 DIAGNOSIS — J439 Emphysema, unspecified: Secondary | ICD-10-CM | POA: Diagnosis not present

## 2021-01-07 DIAGNOSIS — D631 Anemia in chronic kidney disease: Secondary | ICD-10-CM | POA: Insufficient documentation

## 2021-01-07 DIAGNOSIS — R4189 Other symptoms and signs involving cognitive functions and awareness: Secondary | ICD-10-CM | POA: Insufficient documentation

## 2021-01-07 DIAGNOSIS — Z9981 Dependence on supplemental oxygen: Secondary | ICD-10-CM | POA: Insufficient documentation

## 2021-01-07 DIAGNOSIS — J449 Chronic obstructive pulmonary disease, unspecified: Secondary | ICD-10-CM | POA: Insufficient documentation

## 2021-01-07 DIAGNOSIS — M1990 Unspecified osteoarthritis, unspecified site: Secondary | ICD-10-CM | POA: Insufficient documentation

## 2021-01-07 DIAGNOSIS — G4700 Insomnia, unspecified: Secondary | ICD-10-CM | POA: Insufficient documentation

## 2021-01-07 HISTORY — DX: Hypertensive chronic kidney disease with stage 1 through stage 4 chronic kidney disease, or unspecified chronic kidney disease: I12.9

## 2021-01-07 LAB — URINALYSIS, ROUTINE W REFLEX MICROSCOPIC
Ketones, ur: 15 — AB
Leukocytes,Ua: NEGATIVE
Nitrite: NEGATIVE
Specific Gravity, Urine: >=1.03 — AB (ref 1.000–1.030)
Total Protein, Urine: >=300 — AB
Urine Glucose: NEGATIVE
Urobilinogen, UA: 0.2 (ref 0.0–1.0)
pH: 6 (ref 5.0–8.0)

## 2021-01-07 LAB — CBC
HCT: 35.3 % — ABNORMAL LOW (ref 36.0–46.0)
Hemoglobin: 11.2 g/dL — ABNORMAL LOW (ref 12.0–15.0)
MCHC: 31.7 g/dL (ref 30.0–36.0)
MCV: 93 fl (ref 78.0–100.0)
Platelets: 173 10*3/uL (ref 150.0–400.0)
RBC: 3.8 Mil/uL — ABNORMAL LOW (ref 3.87–5.11)
RDW: 14.5 % (ref 11.5–15.5)
WBC: 6.4 10*3/uL (ref 4.0–10.5)

## 2021-01-07 LAB — LIPID PANEL
Cholesterol: 168 mg/dL (ref 0–200)
HDL: 64.3 mg/dL (ref >39.00)
LDL Cholesterol: 71 mg/dL (ref 0–99)
NonHDL: 103.46
Total CHOL/HDL Ratio: 3
Triglycerides: 163 mg/dL — ABNORMAL HIGH (ref 0.0–149.0)
VLDL: 32.6 mg/dL (ref 0.0–40.0)

## 2021-01-07 LAB — POCT URINALYSIS DIPSTICK
Blood, UA: NEGATIVE
Glucose, UA: NEGATIVE
Ketones, UA: NEGATIVE
Leukocytes, UA: NEGATIVE
Nitrite, UA: NEGATIVE
Protein, UA: POSITIVE — AB
Spec Grav, UA: >=1.03 — AB (ref 1.010–1.025)
Urobilinogen, UA: NEGATIVE E.U./dL — AB
pH, UA: 6 (ref 5.0–8.0)

## 2021-01-07 LAB — MICROALBUMIN / CREATININE URINE RATIO
Creatinine,U: 174.9 mg/dL
Microalb Creat Ratio: 164.3 mg/g — ABNORMAL HIGH (ref 0.0–30.0)
Microalb, Ur: 287.5 mg/dL — ABNORMAL HIGH (ref 0.0–1.9)

## 2021-01-07 LAB — BASIC METABOLIC PANEL
BUN: 16 mg/dL (ref 6–23)
CO2: 42 mEq/L — ABNORMAL HIGH (ref 19–32)
Calcium: 9.2 mg/dL (ref 8.4–10.5)
Chloride: 97 mEq/L (ref 96–112)
Creatinine, Ser: 0.88 mg/dL (ref 0.40–1.20)
GFR: 61.61 mL/min (ref >60.00)
Glucose, Bld: 169 mg/dL — ABNORMAL HIGH (ref 70–99)
Potassium: 4.1 mEq/L (ref 3.5–5.1)
Sodium: 146 mEq/L — ABNORMAL HIGH (ref 135–145)

## 2021-01-07 LAB — HEMOGLOBIN A1C: Hgb A1c MFr Bld: 6.8 % — ABNORMAL HIGH (ref 4.6–6.5)

## 2021-01-07 MED ORDER — FUROSEMIDE 40 MG PO TABS: 40.0000 mg | ORAL_TABLET | Freq: Every day | ORAL | 0 refills | Status: DC

## 2021-01-07 NOTE — Progress Notes (Signed)
[facility] [address] [place] [address] Dept: [phone] Dept Fax: [phone]  New Patient Office Visit  Subjective:    Patient ID: Beth Kerr, female    DOB: 1940-10-30, 80 y.o..   MRN: [id number]  Chief Complaint  Patient presents with  . Establish Care    NP- establish care.  C/o having some confusion and thinks she might have an UTI.      History of Present Illness:  Patient is in today to establish care. Beth Kerr is originally from [place]. She is divorced since 12-08-67. She had one daughter, who died in 2012-12-07. She has two grandchildren Beth Kerr and Beth Kerr). She is accompanied today with her granddaughter-in-law, Beth Kerr.  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 She is a previous smoker, having quit 20+ years ago. She denies any alcohol or drug use.  Beth Kerr has a history of carotid stenosis and chronic diastolic heart failure (HFpEF). She is currently managed on amlodipine, Lasix and a daily aspirin. She has a history of hyperlipidemia and is on rosuvastatin.  Beth Kerr has a history of COPD. This impacts her more than any of her current health issues. She is oxygen dependent using 2 lpm. She is prescribed albuterol, budesonide, and Yupelri for use in her nebulizer. She rarely uses the albuterol and only occasionally uses the other two medications. Her granddaughter feels she does better when she will use these.  Beth Kerr has a history of Beth Kerr. She is currently managed on metformin. She has a history of associated CKD. She apparently had an episode of acute on chronic renal failure that was managed by stopping her medications. When the kidneys had improved, these were added back slowly.  Beth Kerr has a history of depression is managed on sertraline. She has also previously had insomnia issues, managed with trazodone. However, more recently, Beth Kerr  has had increased somnolence. Her granddaughter was concerned that she might have a UTI. She also has a history of some cognitive decline and memory issues. She is currently on rivastigmine. Her granddaughter feels this has slowed her decline. Beth Kerr is living with her Beth Kerr and his wife. They are able to provide her assistance as needed.  Past Medical History: Patient Active Problem List   Diagnosis Date Noted  . Osteoarthritis 01/07/2021  . Cognitive decline 01/07/2021  . Chronic obstructive pulmonary disease, unspecified (Beth Kerr) 01/07/2021  . Vitamin D deficiency 01/07/2021  . Oxygen dependent 01/07/2021  . Osteopenia 01/07/2021  . Occlusion and stenosis of bilateral carotid arteries 01/07/2021  . Insomnia 01/07/2021  . Hyperlipidemia 01/07/2021  . Diabetic renal disease (Beth Kerr) 01/07/2021  . Chronic diastolic heart failure (Beth Kerr) 01/07/2021  . Anemia of chronic renal failure 01/07/2021  . Nocturnal hypoxemia 04/25/2018  . Memory disorder 01/28/2017  . DM (Kerr mellitus), Beth 2 with renal complications (Beth Kerr) 01/27/2017   Past Surgical History:  Procedure Laterality Date  . DILATION AND CURETTAGE OF UTERUS    . TONSILLECTOMY     Family History  Problem Relation Age of Onset  . CAD Father   . Heart disease Father   . Lung cancer Other   . Dementia Mother   . Alzheimer's disease Maternal Grandmother   . Cancer Sister   . Kerr Neg Hx   . Stroke Neg Hx    Outpatient Medications Prior to Visit  Medication Sig Dispense Refill  . albuterol (PROVENTIL) (2.5 MG/3ML) 0.083% nebulizer  solution Take 3 mLs (2.5 mg total) by nebulization every 6 (six) hours as needed for wheezing or shortness of breath. 75 mL 12  . amLODipine (NORVASC) 5 MG tablet TAKE 1 TABLET(5 MG) BY MOUTH DAILY 90 tablet 3  . aspirin EC 81 MG tablet Take 1 tablet (81 mg total) by mouth daily. 90 tablet 3  . benzonatate (TESSALON) 100 MG capsule Take 100 mg by mouth 2 (two) times daily.   12  .  budesonide (PULMICORT) 0.25 MG/2ML nebulizer solution USE 1 VIAL  IN  NEBULIZER TWICE  DAILY - Rinse Mouth After Treatment 60 mL 11  . metFORMIN (GLUCOPHAGE) 500 MG tablet Take 500 mg by mouth 2 (two) times daily.    . rivastigmine (EXELON) 1.5 MG capsule TAKE 1 CAPSULE(1.5 MG) BY MOUTH TWICE DAILY 60 capsule 6  . rosuvastatin (CRESTOR) 5 MG tablet Take 1 tablet (5 mg total) by mouth daily. 90 tablet 3  . sertraline (ZOLOFT) 50 MG tablet Take 50 mg by mouth daily.    . traZODone (DESYREL) 50 MG tablet Take 25 mg by mouth as needed for sleep.     Beth Kerr 175 MCG/3ML nebulizer solution USE 1 VIAL IN NEBULIZER DAILY 30 mL 11  . furosemide (LASIX) 40 MG tablet Take 1 tablet (40 mg total) by mouth daily. 30 tablet 0  . Multiple Vitamin (MULTIVITAMIN) tablet Take 1 tablet by mouth daily. (Patient not taking: Reported on 01/07/2021)     No facility-administered medications prior to visit.   Allergies  Allergen Reactions  . Demerol [Meperidine] Anaphylaxis    Objective:   Today's Vitals   01/07/21 0916  BP: 130/66  Pulse: 97  Temp: 97.6 F (36.4 C)  TempSrc: Temporal  SpO2: 99%  Weight: 134 lb 6.4 oz (61 kg)  Height: 5' 0.5" (1.537 m)   Body mass index is 25.82 kg/m.   General: Elderly female. No acute distress. Lungs: Distant breath sounds. No wheezing, rales or rhonchi. CV: RRR without murmurs or rubs. Pulses 2+ bilaterally. Extremities:  No joint swelling or tenderness. 1-2+ edema noted. Feet: Skin intact. Pulses 2+ Psych: Alert and oriented. Normal mood and affect.  Health Maintenance Due  Topic Date Due  . OPHTHALMOLOGY EXAM  Never done  . URINE MICROALBUMIN  Never done  . DEXA SCAN  Never done  . PNA vac Low Risk Adult (1 of 2 - PCV13) Never done  . HEMOGLOBIN A1C  07/30/2017   Urinalysis Neg. for Nitrite and Leukocytes 1+ bilirubin SG= 1.030 Prot: 3+    Assessment & Plan:   1. Chronic diastolic heart failure (Beth Kerr) Beth Kerr has a history fo HFpEF. She is  managed on Lasix and amlodipine. She will continue to follow with cardiology.  - furosemide (LASIX) 40 MG tablet; Take 1 tablet (40 mg total) by mouth daily.  Dispense: 30 tablet; Refill: 0  2. Pulmonary emphysema, unspecified emphysema [facility]) Although Ms. [facility] has nebulizer solutions for albuterol,. budesonide, and Yupelri prescribed, she only intermittently uses these. I have encouraged their use. She will continue to use continuous oxygen and follow with pulmonology.  3. Beth Kerr mellitus with chronic kidney disease, without long-term current use of insulin, unspecified CKD stage (Beth Kerr) Ms. Stauffer is due for follow-up of her annual DM labs. She will continue on metformin.  - Microalbumin / creatinine urine ratio - Basic metabolic panel - Hemoglobin A1c - Urinalysis, Routine w reflex microscopic  4. Anemia of chronic renal failure, unspecified CKD stage Ms. [facility]  has a history of anemia. I will check a CBC to reassess.  - CBC  5. Cognitive decline No current sign of illness or UTI. Recommend Ms. Kerr be adequately hydrating and will follow.  - POCT Urinalysis Dipstick  6. Hyperlipidemia, unspecified hyperlipidemia Beth Due for reassessment.  - Lipid panel  Loyola Mast, MD

## 2021-01-08 ENCOUNTER — Encounter: Payer: Self-pay | Admitting: Family Medicine

## 2021-01-08 ENCOUNTER — Other Ambulatory Visit: Payer: Self-pay

## 2021-01-08 DIAGNOSIS — I5032 Chronic diastolic (congestive) heart failure: Secondary | ICD-10-CM

## 2021-01-08 MED ORDER — FUROSEMIDE 40 MG PO TABS: 40.0000 mg | ORAL_TABLET | Freq: Every day | ORAL | 0 refills | Status: DC

## 2021-01-11 ENCOUNTER — Other Ambulatory Visit: Payer: Self-pay | Admitting: Family Medicine

## 2021-01-11 ENCOUNTER — Encounter: Payer: Self-pay | Admitting: Family Medicine

## 2021-01-11 DIAGNOSIS — I5032 Chronic diastolic (congestive) heart failure: Secondary | ICD-10-CM

## 2021-02-04 ENCOUNTER — Ambulatory Visit: Payer: Medicare Other | Admitting: Internal Medicine

## 2021-02-11 ENCOUNTER — Encounter: Payer: Self-pay | Admitting: Primary Care

## 2021-02-11 ENCOUNTER — Other Ambulatory Visit: Payer: Self-pay

## 2021-02-11 ENCOUNTER — Ambulatory Visit: Payer: Medicare Other | Admitting: Primary Care

## 2021-02-11 VITALS — BP 120/62 | HR 98 | Temp 98.2°F | Ht 60.5 in | Wt 131.4 lb

## 2021-02-11 DIAGNOSIS — J449 Chronic obstructive pulmonary disease, unspecified: Secondary | ICD-10-CM

## 2021-02-11 DIAGNOSIS — J9611 Chronic respiratory failure with hypoxia: Secondary | ICD-10-CM | POA: Diagnosis not present

## 2021-02-11 LAB — BASIC METABOLIC PANEL
BUN: 23 mg/dL (ref 6–23)
CO2: 43 mEq/L — ABNORMAL HIGH (ref 19–32)
Calcium: 9.8 mg/dL (ref 8.4–10.5)
Chloride: 93 mEq/L — ABNORMAL LOW (ref 96–112)
Creatinine, Ser: 0.98 mg/dL (ref 0.40–1.20)
GFR: 54.1 mL/min — ABNORMAL LOW (ref >60.00)
Glucose, Bld: 139 mg/dL — ABNORMAL HIGH (ref 70–99)
Potassium: 3.7 mEq/L (ref 3.5–5.1)
Sodium: 141 mEq/L (ref 135–145)

## 2021-02-11 MED ORDER — ALBUTEROL SULFATE HFA 108 (90 BASE) MCG/ACT IN AERS: 2.0000 | INHALATION_SPRAY | Freq: Four times a day (QID) | RESPIRATORY_TRACT | 6 refills | Status: AC | PRN

## 2021-02-11 NOTE — Assessment & Plan Note (Signed)
-   Patient is unable to maintain oxygen saturation >88% on pulsed oxygen. She needs to use 2L continuous oxygen 24/7  - She has not been started on Triology NIV d/t cost and family did not feel she would be able to safely use it at night. We will check BMET today to monitor CO2, if elevated would repeat ABGs

## 2021-02-11 NOTE — Patient Instructions (Signed)
Recommendations: - Continue Pulmicort nebulizer twice daily - Continue Yupelri nebulizer once daily (please let us know when you have 1 to 2 weeks left of medication and we will send in prescription to your DME company or direct Rx pharmacy)  - Use incentive spirometer 5-10 deep breaths every hour while awake  Orders: - BMET- to monitor CO2 (ordered) - Incentive spirometer   Rx: - Albuterol (Ventolin) 2 puffs every 6 hours as needed for breakthrough shortness of breath or wheezing  Follow-up: -3 months with [name] or sooner if patient develops worsening respiratory symptoms

## 2021-02-11 NOTE — Progress Notes (Signed)
@Patient  ID: , female    DOB: June 11, 1941, 80 y.o.   MRN: [id number]  Chief Complaint  Patient presents with   Follow-up    Pt states she has been doing okay since last visit and denies any complaints.     Referring provider: [id number], MD  HPI: 80 year old female. PMH significant for COPD stage 3 and chronic respiratory failure. Patient of Dr. 94, last seen on 04/29/20.  02/11/2021- Interim hx  Patient presents today for overdue follow-up. Accompanied by her grand-daughter in-law. Doing well, no acute complaints. She reports occasional dry cough and wheezing. No significant shortness of breath at rest. She experiences dyspnea with hills/stairs. 02/13/2021 is too expensive, they have enough supply for a couple of months. She did not start Trilogy ventilator as was advised during last visit, family feels she would be unable to use it correctly at night and it is not affordable at this time for them.    Allergies  Allergen Reactions   Demerol [Meperidine] Anaphylaxis    Immunization History  Administered Date(s) Administered   Influenza, High Dose Seasonal PF 04/25/2017, 04/30/2019   Influenza, Quadrivalent, Recombinant, Inj, Pf 05/08/2017, 06/05/2019, 06/10/2020   Influenza-Unspecified 04/29/2018   PFIZER(Purple Top)SARS-COV-2 Vaccination 10/21/2019, 11/11/2019, 08/28/2020   Pneumococcal Polysaccharide-23 07/19/2012    Past Medical History:  Diagnosis Date   Diabetes mellitus without complication (HCC)    Emphysema/COPD (HCC)    Encephalopathy acute 01/29/2017   Hyperlipidemia    Hypertension    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 90 tablet 3   benzonatate (TESSALON) 100 MG capsule Take 100 mg by mouth 2 (two) times daily.   12   budesonide (PULMICORT) 0.25 MG/2ML nebulizer solution USE 1 VIAL  IN  NEBULIZER TWICE  DAILY - Rinse Mouth After Treatment 60 mL 11   furosemide (LASIX) 40 MG tablet TAKE 1 TABLET(40 MG) BY MOUTH DAILY 90 tablet 0   metFORMIN (GLUCOPHAGE) 500 MG tablet Take 500 mg by mouth 2 (two) times daily.     rivastigmine (EXELON) 1.5 MG capsule TAKE 1 CAPSULE(1.5 MG) BY MOUTH TWICE DAILY 60 capsule 6   rosuvastatin (CRESTOR) 5 MG tablet Take 1 tablet (5 mg total) by mouth daily. 90 tablet 3   sertraline (ZOLOFT) 50 MG tablet Take 50 mg by mouth daily.     traZODone (DESYREL) 50 MG tablet Take 25 mg by mouth as needed for sleep.      YUPELRI 175 MCG/3ML nebulizer solution USE 1 VIAL IN NEBULIZER DAILY 30 mL 11   albuterol (PROVENTIL) (2.5 MG/3ML) 0.083% nebulizer solution Take 3 mLs (2.5 mg total) by nebulization every 6 (six) hours as needed for wheezing or shortness of breath. 75 mL 12   No facility-administered medications prior to visit.    Review of Systems  Review of Systems  Constitutional: Negative.   HENT: Negative.    Respiratory:  Positive for cough and wheezing. Negative for shortness of breath.        Dyspnea on exertion. Intermittent wheezing/dry coug  Cardiovascular: Negative.     Physical Exam  BP 120/62 (BP Location: Left Arm, Patient Position: Sitting, Cuff Size: Normal)   Pulse 98   Temp 98.2 F (36.8 C) (Temporal)   Ht 5' 0.5" (1.537 m)   Wt 131 lb 6.4 oz (59.6 kg)   SpO2 90% Comment: 2L continuous  BMI 25.24 kg/m  Physical Exam Constitutional:      Appearance: Normal appearance.  HENT:     Mouth/Throat:     Mouth: Mucous membranes are moist.     Pharynx:  Oropharynx is clear.  Cardiovascular:     Rate and Rhythm: Normal rate and regular rhythm.     Comments: Trace BLE edema L>R Pulmonary:     Effort: Pulmonary effort is normal.     Breath sounds: Normal breath sounds.     Comments: Faint exp wheeze to upper lungs Musculoskeletal:        General: Normal range of motion.  Skin:    General: Skin is warm and dry.  Neurological:     General: No focal deficit present.     Mental Status: She is alert and oriented to person, place, and time. Mental status is at baseline.  Psychiatric:        Mood and Affect: Mood normal.        Behavior: Behavior normal.        Thought Content: Thought content normal.        Judgment: Judgment normal.     Lab Results:  CBC    Component Value Date/Time   WBC 6.4 01/07/2021 1009   RBC 3.80 (L) 01/07/2021 1009   HGB 11.2 (L) 01/07/2021 1009   HCT 35.3 (L) 01/07/2021 1009   PLT 173.0 01/07/2021 1009   MCV 93.0 01/07/2021 1009   MCH 31.0 02/02/2017 0532   MCHC 31.7 01/07/2021 1009   RDW 14.5 01/07/2021 1009   LYMPHSABS 0.5 (L) 02/02/2017 0532   MONOABS 0.7 02/02/2017 0532   EOSABS 0.3 02/02/2017 0532   BASOSABS 0.0 02/02/2017 0532    BMET    Component Value Date/Time   NA 146 (H) 01/07/2021 1009   K 4.1 01/07/2021 1009   CL 97 01/07/2021 1009   CO2 42 (H) 01/07/2021 1009   GLUCOSE 169 (H) 01/07/2021 1009   BUN 16 01/07/2021 1009   CREATININE 0.88 01/07/2021 1009   CALCIUM 9.2 01/07/2021 1009   GFRNONAA >60 02/03/2017 0439   GFRAA >60 02/03/2017 0439    BNP No results found for: BNP  ProBNP No results found for: PROBNP  Imaging: No results found.   Assessment & Plan:   Stage 3 severe COPD by GOLD classification (HCC) - Stable interval; No recent exacerbations. Occasoinal wheezing/dry cough. She is mostly compliant with her nebulizer's  - Continue Pulmicort 0.25mg /2ML BID; Yueplri once daily (will need refill sent to DME company or Direct [facility] d/t cost) - Recommend she start  using Incentive spirometry 5-10x/hr while awake  - FU in 3 months or sooner if needed  Chronic respiratory failure with hypoxia (HCC) - Patient is unable to maintain oxygen saturation >88% on pulsed oxygen. She needs to use 2L continuous oxygen 24/7  - She has not been started on Triology NIV d/t cost and family did not feel she would be able to safely use it at night. We will check BMET today to monitor  CO2, if elevated would repeat ABGs   Glenford Bayley, NP 02/11/2021

## 2021-02-11 NOTE — Assessment & Plan Note (Signed)
-   Stable interval; No recent exacerbations. Occasoinal wheezing/dry cough. She is mostly compliant with her nebulizer's  - Continue Pulmicort 0.25mg /2ML BID; Yueplri once daily (will need refill sent to DME company or [facility] d/t cost) - Recommend she start using Incentive spirometry 5-10x/hr while awake  - FU in 3 months or sooner if needed

## 2021-02-12 ENCOUNTER — Telehealth: Payer: Self-pay | Admitting: Primary Care

## 2021-02-12 DIAGNOSIS — J449 Chronic obstructive pulmonary disease, unspecified: Secondary | ICD-10-CM

## 2021-02-12 DIAGNOSIS — J9611 Chronic respiratory failure with hypoxia: Secondary | ICD-10-CM

## 2021-02-12 NOTE — Telephone Encounter (Signed)
Please order and set up patient to have ABGs

## 2021-02-12 NOTE — Telephone Encounter (Signed)
Yes please get ABG

## 2021-02-12 NOTE — Progress Notes (Signed)
Please let patient's grand-daughter know her CO2 was elevated, similar to last month. I will discuss with [name]

## 2021-02-12 NOTE — Telephone Encounter (Signed)
Patient of yours with severe COPD and chronic respiratory failure. You had ordered her Trilogy ventilator last visit in September 2021 but family did not feel she would be able to safely use this at night and also could not afford. I checked BMET, CO2 was 43. She is wears 2-3L oxygen continuously. Feels well. Awake, alert and oriented. Do you want me to get ABGs?

## 2021-02-15 ENCOUNTER — Encounter: Payer: Self-pay | Admitting: Neurology

## 2021-02-15 ENCOUNTER — Other Ambulatory Visit: Payer: Self-pay

## 2021-02-15 ENCOUNTER — Ambulatory Visit: Payer: Medicare Other | Admitting: Neurology

## 2021-02-15 VITALS — BP 132/59 | HR 59 | Ht 60.0 in | Wt 130.2 lb

## 2021-02-15 DIAGNOSIS — R413 Other amnesia: Secondary | ICD-10-CM

## 2021-02-15 MED ORDER — RIVASTIGMINE TARTRATE 3 MG PO CAPS: 3.0000 mg | ORAL_CAPSULE | Freq: Two times a day (BID) | ORAL | 2 refills | Status: AC

## 2021-02-15 NOTE — Progress Notes (Signed)
Reason for visit: Memory disorder  Beth Kerr is an 80 y.o. female  History of present illness:  Beth Kerr is an 80 year old right-handed white female with a history of a concussion that occurred in 2017.  The patient had a change in cognitive capacity since that time, she currently is living with her grandson.  The grandson manages her medications and appointments and finances.  The patient has had very little change in her cognitive function over the last 2 years.  She was not able to tolerate Aricept, but she was placed on low-dose Exelon and seemed to do well with this.  She has a good appetite, she reports no abdominal symptoms.  She is sleeping fairly well at night.  The patient is on oxygen, there is some concern about desaturations at night and possible CO2 narcosis.  The patient however reports no early morning headaches or confusion.  The patient returns for further evaluation.  The patient has a chronic gait disorder, she uses a walker for ambulation.  She reports no recent falls.  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 Her smoking use included cigarettes. She has a 56.00 pack-year smoking history. She has never used smokeless tobacco. She reports that she does not drink alcohol and does not use drugs.    Allergies  Allergen Reactions    Demerol [Meperidine] Anaphylaxis    Medications:  Prior to Admission medications   Medication Sig Start Date End Date Taking? Authorizing Provider  albuterol (VENTOLIN HFA) 108 (90 Base) MCG/ACT inhaler Inhale 2 puffs into the lungs every 6 (six) hours as needed for wheezing or shortness of breath. 02/11/21  Yes Glenford Bayley, NP  amLODipine (NORVASC) 5 MG tablet TAKE 1 TABLET(5 MG) BY MOUTH DAILY 03/26/20  Yes Yates Decamp, MD  aspirin EC 81 MG tablet Take 1 tablet (81 mg total) by mouth daily. 03/20/19  Yes Yates Decamp, MD  benzonatate (TESSALON) 100 MG capsule Take 100 mg by mouth 2 (two) times daily.  09/02/17  Yes [provider]  budesonide (PULMICORT) 0.25 MG/2ML nebulizer solution USE 1 VIAL  IN  NEBULIZER TWICE  DAILY - Rinse Mouth After Treatment 05/18/20  Yes Kalman Shan, MD  furosemide (LASIX) 40 MG tablet TAKE 1 TABLET(40 MG) BY MOUTH DAILY 01/11/21  Yes Loyola Mast, MD  metFORMIN (GLUCOPHAGE) 500 MG tablet Take 500 mg by mouth 2 (two) times daily. 10/21/19  Yes [provider]  rivastigmine (EXELON) 1.5 MG capsule TAKE 1 CAPSULE(1.5 MG) BY MOUTH TWICE DAILY 11/04/19  Yes York Spaniel, MD  rosuvastatin (CRESTOR) 5 MG tablet Take 1 tablet (5 mg total) by mouth daily. 03/25/20 03/20/21 Yes Yates Decamp, MD  sertraline (ZOLOFT) 50 MG  tablet Take 50 mg by mouth daily.   Yes [provider]  traZODone (DESYREL) 50 MG tablet Take 25 mg by mouth as needed for sleep.    Yes [provider]  YUPELRI 175 MCG/3ML nebulizer solution USE 1 VIAL IN NEBULIZER DAILY 05/18/20  Yes Kalman Shan, MD    ROS:  Out of a complete 14 system review of symptoms, the patient complains only of the following symptoms, and all other reviewed systems are negative.  Walking difficulty Memory change Shortness of breath  Blood pressure (!) 132/59, pulse (!) 59, height 5' (1.524 m), weight 130 lb 3.2 oz (59.1 kg).  Physical Exam  General: The patient is alert and  cooperative at the time of the examination.  Skin: No significant peripheral edema is noted.   Neurologic Exam  Mental status: The patient is alert and oriented x 3 at the time of the examination. The Mini-Mental status examination done today shows a total score of 26/30.   Cranial nerves: Facial symmetry is present. Speech is normal, no aphasia or dysarthria is noted. Extraocular movements are full. Visual fields are full.  Motor: The patient has good strength in all 4 extremities.  Sensory examination: Soft touch sensation is symmetric on the face, arms, and legs.  Coordination: The patient has good finger-nose-finger and heel-to-shin bilaterally.  Gait and station: The patient is able to walk well with a walker, she has good turns and good stride.  Romberg is negative.  Tandem gait was not attempted.  Reflexes: Deep tendon reflexes are symmetric.   Assessment/Plan:  1.  Memory disturbance  2.  History of concussion  3.  Gait disturbance  The patient will be increased on Exelon to a 3 mg capsule taking 1 twice a day, they will call if there are side effects on the medication.  The patient will follow-up otherwise in 9 months.  She is progressing slowly with the memory.  In the future, the patient may follow-up with Dr. Vickey Huger.  Marlan Palau MD 02/15/2021 11:05 AM  [facility] [address] [place], [place] [address]  Phone [phone] Fax [phone]

## 2021-02-15 NOTE — Telephone Encounter (Signed)
Called and spoke with [name] (on DPR) letting her know the info stated by both Beth and MR and stated that due to CO2 levels being 43 from BMET that recommendations was to do ABG to see if we can get pt approved for trilogy vent. [name] verbalized understanding and was fine with ABG being ordered. Order has been placed. Nothing further needed.

## 2021-03-18 ENCOUNTER — Other Ambulatory Visit: Payer: Self-pay

## 2021-03-18 ENCOUNTER — Ambulatory Visit: Payer: Medicare Other

## 2021-03-18 DIAGNOSIS — I6523 Occlusion and stenosis of bilateral carotid arteries: Secondary | ICD-10-CM

## 2021-03-21 NOTE — Progress Notes (Signed)
Carotid artery duplex 03/18/2021: Duplex suggests stenosis in the right internal carotid artery (50-69%). Lower end of spectrum. Duplex suggests stenosis in the right external carotid artery (<50%). Duplex suggests stenosis in the left internal carotid artery (50-69%). Duplex suggests stenosis in the left external carotid artery (<50%). Antegrade right vertebral artery flow. Antegrade left vertebral artery flow. Compared to the study done on 03/18/2020, there is mild progression of bilateral ICA stenosis from <50%. Follow up in six months is appropriate if clinically indicated.

## 2021-03-25 ENCOUNTER — Ambulatory Visit: Payer: Medicare Other | Admitting: Cardiology

## 2021-03-25 ENCOUNTER — Encounter: Payer: Self-pay | Admitting: Cardiology

## 2021-03-25 ENCOUNTER — Other Ambulatory Visit: Payer: Self-pay

## 2021-03-25 VITALS — BP 128/57 | HR 65 | Temp 98.2°F | Resp 17 | Ht 60.0 in | Wt 130.0 lb

## 2021-03-25 DIAGNOSIS — E7800 Pure hypercholesterolemia, unspecified: Secondary | ICD-10-CM | POA: Diagnosis not present

## 2021-03-25 DIAGNOSIS — I10 Essential (primary) hypertension: Secondary | ICD-10-CM

## 2021-03-25 DIAGNOSIS — I6523 Occlusion and stenosis of bilateral carotid arteries: Secondary | ICD-10-CM

## 2021-03-25 DIAGNOSIS — N1832 Chronic kidney disease, stage 3b: Secondary | ICD-10-CM

## 2021-03-25 NOTE — Progress Notes (Signed)
Primary Physician/Referring:  Loyola Mast, MD  Patient ID: Beth Kerr, female    DOB: Mar 31, 1941, 80 y.o.   MRN: [id number]  No chief complaint on file.  HPI:    HPI: Beth Kerr  is a 80 y.o. asymptomatic carotid artery stenosis, diastolic CHF, hypertension, uncontrolled diabetes mellitus, hyperlipidemia and prior history of tobacco use disorder and severe emphysema and on continuous home O2.  She now presents for annual follow up visit for hypertension and carotid stenosis.  Except for chronic dyspnea she has no other specific complaints today.  Denies leg edema, PND or orthopnea.  Accompanied by daughter in law at the bedside.   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  ROS  Review of Systems  Cardiovascular:  Positive for dyspnea on exertion. Negative for chest pain and leg swelling.  Respiratory:  Positive for cough and wheezing.   Musculoskeletal:  Positive for back pain, joint pain and muscle cramps.   Objective  Blood pressure (!) 128/57, pulse 65, temperature 98.2 F (36.8 C), temperature source Temporal, resp. rate 17, height 5' (1.524 m), weight 130 lb (59 kg), SpO2 98 %. Body mass index is 25.39 kg/m.   Vitals with BMI 03/25/2021 02/15/2021 02/11/2021  Height 5\' 0"  5\' 0"  5' 0.5"  Weight  130 lbs 130 lbs 3 oz 131 lbs 6 oz  BMI 25.39 25.43 25.23  Systolic 128 132  Diastolic 57 59 62  Pulse 65 59 98    Physical Exam Constitutional:      General: She is not in acute distress.    Appearance: She is well-developed.  Neck:     Thyroid: No thyromegaly.     Vascular: Carotid bruit (bilateral) present. No JVD.  Cardiovascular:     Rate and Rhythm: Normal rate and regular rhythm.     Pulses: Normal pulses and intact distal pulses.     Heart sounds: Normal heart sounds. No murmur heard.   No gallop.  Pulmonary:     Effort: Pulmonary effort is normal.     Breath sounds: Rhonchi (scattered) present.  Abdominal:     General: Bowel sounds are normal.     Palpations: Abdomen is soft.  Neurological:     General: No focal deficit present.     Mental Status: She is alert.   Laboratory examination:   CMP Latest Ref Rng & Units 02/11/2021 01/07/2021 12/20/2017  Glucose 70 - 99 mg/dL 03/09/2021) 12/22/2017) 323(F)  BUN 6 - 23 mg/dL 23 16 573(U)  Creatinine 0.40 - 1.20 mg/dL 202(R 42(H 0.62)  Sodium 135 - 145 mEq/L 141 146(H) 140  Potassium 3.5 - 5.1 mEq/L 3.7 4.1 4.1  Chloride 96 - 112 mEq/L  93(L) 97 98  CO2 19 - 32 mEq/L 43(H) 42(H) 33(H)  Calcium 8.4 - 10.5 mg/dL 9.8 9.2 9.4  Total Protein 6.5 - 8.1 g/dL - - -  Total Bilirubin 0.3 - 1.2 mg/dL - - -  Alkaline Phos 38 - 126 U/L - - -  AST 15 - 41 U/L - - -  ALT 14 - 54 U/L - - -    CBC Latest Ref Rng & Units 01/07/2021 02/02/2017 02/01/2017  WBC 4.0 - 10.5 K/uL 6.4 7.1 6.6  Hemoglobin 12.0 - 15.0 g/dL 11.2(L) 11.6(L) 10.4(L)  Hematocrit 36.0 - 46.0 % 35.3(L) 38.0 33.8(L)  Platelets 150.0 - 400.0 K/uL 173.0 169 152   Lipid Panel     Component Value Date/Time   CHOL 168 01/07/2021 1009   TRIG 163.0 (H) 01/07/2021 1009   HDL 64.30 01/07/2021 1009   CHOLHDL 3 01/07/2021 1009   VLDL 32.6 01/07/2021 1009   LDLCALC 71 01/07/2021 1009   HEMOGLOBIN A1C Lab Results  Component Value Date   HGBA1C 6.8 (H) 01/07/2021   MPG 157  01/28/2017   TSH No results for input(s): TSH in the last 8760 hours.   External labs:  Cholesterol, total 281.000 M 05/31/2019 HDL 70.000 MG 05/31/2019 LDL-C 172.000 05/31/2019 Triglycerides 211.000 05/31/2019  A1C 6.900 % 09/24/2019  Medications   Current Meds  Medication Sig   albuterol (VENTOLIN HFA) 108 (90 Base) MCG/ACT inhaler Inhale 2 puffs into the lungs every 6 (six) hours as needed for wheezing or shortness of breath.   amLODipine (NORVASC) 5 MG tablet Take 1 tablet by mouth daily.   aspirin EC 81 MG tablet Take 1 tablet (81 mg total) by mouth daily.   benzonatate (TESSALON) 100 MG capsule Take 100 mg by mouth 2 (two) times daily.    budesonide (PULMICORT) 0.25 MG/2ML nebulizer solution USE 1 VIAL  IN  NEBULIZER TWICE  DAILY - Rinse Mouth After Treatment   furosemide (LASIX) 40 MG tablet TAKE 1 TABLET(40 MG) BY MOUTH DAILY   metFORMIN (GLUCOPHAGE) 500 MG tablet Take 1 tablet by mouth 2 (two) times daily with a meal.   rivastigmine (EXELON) 3 MG capsule Take 1 capsule (3 mg total) by mouth 2 (two) times daily.   rosuvastatin (CRESTOR) 5 MG tablet Take 1 tablet (5 mg total) by mouth daily.   sertraline (ZOLOFT) 50 MG tablet Take 1 tablet by mouth daily.   traZODone (DESYREL) 50 MG tablet 1 tablet at bedtime.   YUPELRI 175 MCG/3ML nebulizer solution USE 1 VIAL IN NEBULIZER DAILY   [DISCONTINUED] amLODipine (NORVASC) 5 MG tablet TAKE 1 TABLET(5 MG) BY MOUTH DAILY   [DISCONTINUED] metFORMIN (GLUCOPHAGE) 500 MG tablet Take 500 mg by mouth 2 (two) times daily.   [DISCONTINUED] sertraline (ZOLOFT) 50 MG tablet Take 50 mg by mouth daily.    No orders of the defined types were placed in this encounter.  Medications after today's encounter  Current Outpatient Medications  Medication Instructions   albuterol (VENTOLIN HFA) 108 (90 Base) MCG/ACT inhaler 2 puffs, Inhalation, Every 6 hours PRN   amLODipine (NORVASC) 5 MG tablet 1 tablet, Oral, Daily   aspirin EC 81 mg, Oral, Daily    benzonatate (TESSALON) 100 mg, Oral, 2 times daily   budesonide (PULMICORT) 0.25 MG/2ML nebulizer solution USE 1 VIAL  IN  NEBULIZER TWICE  DAILY - Rinse Mouth After Treatment   furosemide (LASIX) 40 MG tablet TAKE 1 TABLET(40 MG) BY MOUTH DAILY   metFORMIN (GLUCOPHAGE) 500 MG tablet 1  tablet, Oral, 2 times daily with meals   rivastigmine (EXELON) 3 mg, Oral, 2 times daily   rosuvastatin (CRESTOR) 5 mg, Oral, Daily   sertraline (ZOLOFT) 50 MG tablet 1 tablet, Oral, Daily   traZODone (DESYREL) 50 MG tablet 1 tablet, Daily at bedtime   YUPELRI 175 MCG/3ML nebulizer solution USE 1 VIAL IN NEBULIZER DAILY    Cardiac Studies:   Lexiscan stress 09/21/12: Resting EKG NSR, Poor R wave progression. Stress EKG was non diagnostic for ischemia. No ST-T changes of ischemia noted with pharmacologic stress testing. Stress symptoms included SHORTNESS OF BREATH, HEADACHE AND CHEST PRESSURE. Stress terminated due to completion of protocol. Normal perfusion without ischemia. LVEF 77%.  [facility]:  Left ventricle: The cavity size was normal. Wall thickness was normal. Systolic function was normal. The estimated ejection fraction was in the range of 55% to 60%. Wall motion was normal; there were no regional wall motion abnormalities. Features are consistent with a pseudonormal left ventricular filling pattern, with concomitant abnormal relaxation and increased filling pressure (grade 2 diastolic dysfunction).  Mitral valve: Calcified annulus.Right ventricle: The cavity size was mildly dilated.  Carotid artery duplex 03/18/2021: Duplex suggests stenosis in the right internal carotid artery (50-69%). Lower end of spectrum. Duplex suggests stenosis in the right external carotid artery (<50%). Duplex suggests stenosis in the left internal carotid artery (50-69%). Duplexsuggests stenosis in the left external carotid artery (<50%). Antegrade right vertebral artery flow. Antegrade left vertebral artery  flow. Compared to the study done on 03/18/2020, there is mild progression of bilateral ICA stenosis from <50%. Follow up in six months is appropriate if clinicallyindicated.  EKG:   EKG 03/25/2020: Normal sinus rhythm at the rate of 95 bpm, normal axis, right bundle branch block.  No evidence of ischemia.  No significant change from 03/20/2018.  Assessment     ICD-10-CM   1. Primary hypertension  I10 EKG 12-Lead    2. Asymptomatic bilateral carotid artery stenosis  I65.23     3. Stage 3b chronic kidney disease (HCC)  N18.32     4. Hypercholesteremia  E78.00        Recommendations:   Beth LaudBetty H Buhrman  is a 80 y.o.  asymptomatic carotid artery stenosis, diastolic CHF, hypertension, uncontrolled diabetes mellitus, hyperlipidemia and prior history of tobacco use disorder and severe emphysema and on continuous home O2.  She now presents for 6 month follow up visit for hypertension and carotid stenosis.    Blood pressures well controlled.  She does have bilateral moderate asymptomatic carotid artery stenosis, will continue 6 monthly surveillance.  There is mild progression of disease to bilateral carotids.  I reviewed her external labs, triglycerides remain elevated, LDL is at goal.  We have discussed regarding making diet changes. Weight loss stressed again and gave positive reinforcement. [facility] diet (low glycemic) sheet given to the patient.  Discussed regarding DASH diet and salt restriction.   Patient presents for follow-up of asymptomatic bilateral carotid artery stenosis, dyspnea and chronic diastolic heart failure.  She is presently not on any blood pressure medications.  I have started her on amlodipine 5 mg daily, there were no refills.  If blood pressure is well controlled, she has an appointment to see her PCP in 3 to 4 weeks, she will request for 90-day refills.  She is now tolerating Crestor for secondary prevention, without any side effects.  She has responded well with very low-dose  of Crestor, she does need follow-up lipid status.  Lipids  are now being managed by her PCP.  In view of stage III chronic kidney disease, I still would recommend keeping her blood pressure to at least closer to 130 mmHg.  Dyspnea is remained stable, no clinical evidence of heart failure, no significant change in EKG.  I will see her back in a year with continued surveillance of her mild asymptomatic bilateral carotid artery stenosis.   Yates Decamp, MD, [facility] 03/25/2021, 11:39 AM Office: [phone]

## 2021-04-09 ENCOUNTER — Ambulatory Visit: Payer: Medicare Other | Admitting: Family Medicine

## 2021-04-16 ENCOUNTER — Other Ambulatory Visit: Payer: Self-pay | Admitting: Family Medicine

## 2021-04-16 DIAGNOSIS — I5032 Chronic diastolic (congestive) heart failure: Secondary | ICD-10-CM

## 2021-04-19 MED ORDER — FUROSEMIDE 40 MG PO TABS: 40.0000 mg | ORAL_TABLET | Freq: Every day | ORAL | 0 refills | Status: AC

## 2021-04-27 DIAGNOSIS — Z9981 Dependence on supplemental oxygen: Secondary | ICD-10-CM | POA: Diagnosis not present

## 2021-04-27 DIAGNOSIS — I4510 Unspecified right bundle-branch block: Secondary | ICD-10-CM | POA: Diagnosis not present

## 2021-04-27 DIAGNOSIS — Z20822 Contact with and (suspected) exposure to covid-19: Secondary | ICD-10-CM | POA: Diagnosis not present

## 2021-04-27 DIAGNOSIS — I495 Sick sinus syndrome: Secondary | ICD-10-CM | POA: Diagnosis not present

## 2021-04-27 DIAGNOSIS — R918 Other nonspecific abnormal finding of lung field: Secondary | ICD-10-CM | POA: Diagnosis not present

## 2021-04-27 DIAGNOSIS — J449 Chronic obstructive pulmonary disease, unspecified: Secondary | ICD-10-CM | POA: Diagnosis not present

## 2021-04-27 DIAGNOSIS — J9611 Chronic respiratory failure with hypoxia: Secondary | ICD-10-CM | POA: Diagnosis not present

## 2021-04-27 DIAGNOSIS — R778 Other specified abnormalities of plasma proteins: Secondary | ICD-10-CM | POA: Diagnosis not present

## 2021-04-27 DIAGNOSIS — R0902 Hypoxemia: Secondary | ICD-10-CM | POA: Diagnosis not present

## 2021-04-27 DIAGNOSIS — R42 Dizziness and giddiness: Secondary | ICD-10-CM | POA: Diagnosis not present

## 2021-04-27 DIAGNOSIS — I959 Hypotension, unspecified: Secondary | ICD-10-CM | POA: Diagnosis not present

## 2021-04-28 ENCOUNTER — Ambulatory Visit: Payer: Medicare Other | Admitting: Internal Medicine

## 2021-04-28 DIAGNOSIS — I272 Pulmonary hypertension, unspecified: Secondary | ICD-10-CM | POA: Diagnosis not present

## 2021-04-28 DIAGNOSIS — Z9981 Dependence on supplemental oxygen: Secondary | ICD-10-CM | POA: Diagnosis not present

## 2021-04-28 DIAGNOSIS — J441 Chronic obstructive pulmonary disease with (acute) exacerbation: Secondary | ICD-10-CM | POA: Diagnosis not present

## 2021-04-28 DIAGNOSIS — I440 Atrioventricular block, first degree: Secondary | ICD-10-CM | POA: Diagnosis not present

## 2021-04-28 DIAGNOSIS — I361 Nonrheumatic tricuspid (valve) insufficiency: Secondary | ICD-10-CM | POA: Diagnosis not present

## 2021-04-28 DIAGNOSIS — I495 Sick sinus syndrome: Secondary | ICD-10-CM | POA: Diagnosis not present

## 2021-04-28 DIAGNOSIS — I441 Atrioventricular block, second degree: Secondary | ICD-10-CM | POA: Diagnosis not present

## 2021-04-28 DIAGNOSIS — I4510 Unspecified right bundle-branch block: Secondary | ICD-10-CM | POA: Diagnosis not present

## 2021-04-28 DIAGNOSIS — R001 Bradycardia, unspecified: Secondary | ICD-10-CM | POA: Diagnosis not present

## 2021-04-28 DIAGNOSIS — E1122 Type 2 diabetes mellitus with diabetic chronic kidney disease: Secondary | ICD-10-CM | POA: Diagnosis not present

## 2021-04-28 DIAGNOSIS — J449 Chronic obstructive pulmonary disease, unspecified: Secondary | ICD-10-CM | POA: Diagnosis not present

## 2021-04-29 DIAGNOSIS — Z950 Presence of cardiac pacemaker: Secondary | ICD-10-CM | POA: Diagnosis not present

## 2021-04-29 DIAGNOSIS — I129 Hypertensive chronic kidney disease with stage 1 through stage 4 chronic kidney disease, or unspecified chronic kidney disease: Secondary | ICD-10-CM | POA: Diagnosis not present

## 2021-04-29 DIAGNOSIS — R413 Other amnesia: Secondary | ICD-10-CM | POA: Diagnosis not present

## 2021-04-29 DIAGNOSIS — R001 Bradycardia, unspecified: Secondary | ICD-10-CM | POA: Diagnosis not present

## 2021-04-29 DIAGNOSIS — F32A Depression, unspecified: Secondary | ICD-10-CM | POA: Diagnosis not present

## 2021-04-29 DIAGNOSIS — J449 Chronic obstructive pulmonary disease, unspecified: Secondary | ICD-10-CM | POA: Diagnosis not present

## 2021-04-29 DIAGNOSIS — I441 Atrioventricular block, second degree: Secondary | ICD-10-CM | POA: Diagnosis not present

## 2021-04-29 DIAGNOSIS — Z87891 Personal history of nicotine dependence: Secondary | ICD-10-CM | POA: Diagnosis not present

## 2021-04-29 DIAGNOSIS — I4510 Unspecified right bundle-branch block: Secondary | ICD-10-CM | POA: Diagnosis not present

## 2021-04-30 DIAGNOSIS — I4439 Other atrioventricular block: Secondary | ICD-10-CM | POA: Diagnosis not present

## 2021-04-30 DIAGNOSIS — I495 Sick sinus syndrome: Secondary | ICD-10-CM | POA: Diagnosis not present

## 2021-04-30 DIAGNOSIS — Z9981 Dependence on supplemental oxygen: Secondary | ICD-10-CM | POA: Diagnosis not present

## 2021-04-30 DIAGNOSIS — J449 Chronic obstructive pulmonary disease, unspecified: Secondary | ICD-10-CM | POA: Diagnosis not present

## 2021-04-30 DIAGNOSIS — R413 Other amnesia: Secondary | ICD-10-CM | POA: Diagnosis not present

## 2021-04-30 DIAGNOSIS — J9621 Acute and chronic respiratory failure with hypoxia: Secondary | ICD-10-CM | POA: Diagnosis not present

## 2021-04-30 DIAGNOSIS — R918 Other nonspecific abnormal finding of lung field: Secondary | ICD-10-CM | POA: Diagnosis not present

## 2021-04-30 DIAGNOSIS — R001 Bradycardia, unspecified: Secondary | ICD-10-CM | POA: Diagnosis not present

## 2021-05-01 DIAGNOSIS — Z9981 Dependence on supplemental oxygen: Secondary | ICD-10-CM | POA: Diagnosis not present

## 2021-05-01 DIAGNOSIS — J9811 Atelectasis: Secondary | ICD-10-CM | POA: Diagnosis not present

## 2021-05-01 DIAGNOSIS — J9621 Acute and chronic respiratory failure with hypoxia: Secondary | ICD-10-CM | POA: Diagnosis not present

## 2021-05-01 DIAGNOSIS — J90 Pleural effusion, not elsewhere classified: Secondary | ICD-10-CM | POA: Diagnosis not present

## 2021-05-01 DIAGNOSIS — R001 Bradycardia, unspecified: Secondary | ICD-10-CM | POA: Diagnosis not present

## 2021-05-01 DIAGNOSIS — I5032 Chronic diastolic (congestive) heart failure: Secondary | ICD-10-CM | POA: Diagnosis not present

## 2021-05-02 DIAGNOSIS — R001 Bradycardia, unspecified: Secondary | ICD-10-CM | POA: Diagnosis not present

## 2021-05-02 DIAGNOSIS — I4510 Unspecified right bundle-branch block: Secondary | ICD-10-CM | POA: Diagnosis not present

## 2021-05-02 DIAGNOSIS — J189 Pneumonia, unspecified organism: Secondary | ICD-10-CM | POA: Diagnosis not present

## 2021-05-02 DIAGNOSIS — J9621 Acute and chronic respiratory failure with hypoxia: Secondary | ICD-10-CM | POA: Diagnosis not present

## 2021-05-02 DIAGNOSIS — I491 Atrial premature depolarization: Secondary | ICD-10-CM | POA: Diagnosis not present

## 2021-05-02 DIAGNOSIS — I498 Other specified cardiac arrhythmias: Secondary | ICD-10-CM | POA: Diagnosis not present

## 2021-05-02 DIAGNOSIS — J449 Chronic obstructive pulmonary disease, unspecified: Secondary | ICD-10-CM | POA: Diagnosis not present

## 2021-05-03 DIAGNOSIS — I5032 Chronic diastolic (congestive) heart failure: Secondary | ICD-10-CM | POA: Diagnosis not present

## 2021-05-03 DIAGNOSIS — R001 Bradycardia, unspecified: Secondary | ICD-10-CM | POA: Diagnosis not present

## 2021-05-03 DIAGNOSIS — J9621 Acute and chronic respiratory failure with hypoxia: Secondary | ICD-10-CM | POA: Diagnosis not present

## 2021-05-03 DIAGNOSIS — Z9981 Dependence on supplemental oxygen: Secondary | ICD-10-CM | POA: Diagnosis not present

## 2021-05-04 DIAGNOSIS — Z950 Presence of cardiac pacemaker: Secondary | ICD-10-CM | POA: Diagnosis not present

## 2021-05-04 DIAGNOSIS — I4891 Unspecified atrial fibrillation: Secondary | ICD-10-CM | POA: Diagnosis not present

## 2021-05-04 DIAGNOSIS — R001 Bradycardia, unspecified: Secondary | ICD-10-CM | POA: Diagnosis not present

## 2021-05-04 DIAGNOSIS — I5032 Chronic diastolic (congestive) heart failure: Secondary | ICD-10-CM | POA: Diagnosis not present

## 2021-05-04 DIAGNOSIS — J449 Chronic obstructive pulmonary disease, unspecified: Secondary | ICD-10-CM | POA: Diagnosis not present

## 2021-05-04 DIAGNOSIS — J9621 Acute and chronic respiratory failure with hypoxia: Secondary | ICD-10-CM | POA: Diagnosis not present

## 2021-05-04 DIAGNOSIS — Z9981 Dependence on supplemental oxygen: Secondary | ICD-10-CM | POA: Diagnosis not present

## 2021-05-04 DIAGNOSIS — J9622 Acute and chronic respiratory failure with hypercapnia: Secondary | ICD-10-CM | POA: Diagnosis not present

## 2021-05-04 DIAGNOSIS — J9611 Chronic respiratory failure with hypoxia: Secondary | ICD-10-CM | POA: Diagnosis not present

## 2021-05-05 DIAGNOSIS — I5032 Chronic diastolic (congestive) heart failure: Secondary | ICD-10-CM | POA: Diagnosis not present

## 2021-05-05 DIAGNOSIS — R001 Bradycardia, unspecified: Secondary | ICD-10-CM | POA: Diagnosis not present

## 2021-05-05 DIAGNOSIS — Z9981 Dependence on supplemental oxygen: Secondary | ICD-10-CM | POA: Diagnosis not present

## 2021-05-05 DIAGNOSIS — J9621 Acute and chronic respiratory failure with hypoxia: Secondary | ICD-10-CM | POA: Diagnosis not present

## 2021-05-06 DIAGNOSIS — Z950 Presence of cardiac pacemaker: Secondary | ICD-10-CM | POA: Diagnosis not present

## 2021-05-06 DIAGNOSIS — I4510 Unspecified right bundle-branch block: Secondary | ICD-10-CM | POA: Diagnosis not present

## 2021-05-06 DIAGNOSIS — I459 Conduction disorder, unspecified: Secondary | ICD-10-CM | POA: Diagnosis not present

## 2021-05-06 DIAGNOSIS — F32A Depression, unspecified: Secondary | ICD-10-CM | POA: Diagnosis not present

## 2021-05-07 DIAGNOSIS — J449 Chronic obstructive pulmonary disease, unspecified: Secondary | ICD-10-CM | POA: Diagnosis not present

## 2021-05-07 DIAGNOSIS — R5381 Other malaise: Secondary | ICD-10-CM | POA: Diagnosis not present

## 2021-05-07 DIAGNOSIS — I495 Sick sinus syndrome: Secondary | ICD-10-CM | POA: Diagnosis not present

## 2021-05-07 DIAGNOSIS — Z950 Presence of cardiac pacemaker: Secondary | ICD-10-CM | POA: Diagnosis not present

## 2021-05-10 DIAGNOSIS — F0390 Unspecified dementia without behavioral disturbance: Secondary | ICD-10-CM | POA: Diagnosis not present

## 2021-05-10 DIAGNOSIS — E119 Type 2 diabetes mellitus without complications: Secondary | ICD-10-CM | POA: Diagnosis not present

## 2021-05-14 DIAGNOSIS — F0390 Unspecified dementia without behavioral disturbance: Secondary | ICD-10-CM | POA: Diagnosis not present

## 2021-05-14 DIAGNOSIS — Z95810 Presence of automatic (implantable) cardiac defibrillator: Secondary | ICD-10-CM | POA: Diagnosis not present

## 2021-05-14 DIAGNOSIS — Z9119 Patient's noncompliance with other medical treatment and regimen: Secondary | ICD-10-CM | POA: Diagnosis not present

## 2021-05-14 DIAGNOSIS — R404 Transient alteration of awareness: Secondary | ICD-10-CM | POA: Diagnosis not present

## 2021-05-14 DIAGNOSIS — R400 Somnolence: Secondary | ICD-10-CM | POA: Diagnosis not present

## 2021-05-14 DIAGNOSIS — J9811 Atelectasis: Secondary | ICD-10-CM | POA: Diagnosis not present

## 2021-05-14 DIAGNOSIS — E1122 Type 2 diabetes mellitus with diabetic chronic kidney disease: Secondary | ICD-10-CM | POA: Diagnosis not present

## 2021-05-14 DIAGNOSIS — I495 Sick sinus syndrome: Secondary | ICD-10-CM | POA: Diagnosis not present

## 2021-05-14 DIAGNOSIS — I959 Hypotension, unspecified: Secondary | ICD-10-CM | POA: Diagnosis not present

## 2021-05-14 DIAGNOSIS — J449 Chronic obstructive pulmonary disease, unspecified: Secondary | ICD-10-CM | POA: Diagnosis not present

## 2021-05-14 DIAGNOSIS — J90 Pleural effusion, not elsewhere classified: Secondary | ICD-10-CM | POA: Diagnosis not present

## 2021-05-14 DIAGNOSIS — R0902 Hypoxemia: Secondary | ICD-10-CM | POA: Diagnosis not present

## 2021-05-14 DIAGNOSIS — Z45018 Encounter for adjustment and management of other part of cardiac pacemaker: Secondary | ICD-10-CM | POA: Diagnosis not present

## 2021-05-14 DIAGNOSIS — R0602 Shortness of breath: Secondary | ICD-10-CM | POA: Diagnosis not present

## 2021-05-14 DIAGNOSIS — R1310 Dysphagia, unspecified: Secondary | ICD-10-CM | POA: Diagnosis not present

## 2021-05-14 DIAGNOSIS — J9622 Acute and chronic respiratory failure with hypercapnia: Secondary | ICD-10-CM | POA: Diagnosis not present

## 2021-05-14 DIAGNOSIS — N183 Chronic kidney disease, stage 3 unspecified: Secondary | ICD-10-CM | POA: Diagnosis not present

## 2021-05-14 DIAGNOSIS — I4510 Unspecified right bundle-branch block: Secondary | ICD-10-CM | POA: Diagnosis not present

## 2021-05-14 DIAGNOSIS — R410 Disorientation, unspecified: Secondary | ICD-10-CM | POA: Diagnosis not present

## 2021-05-14 DIAGNOSIS — I129 Hypertensive chronic kidney disease with stage 1 through stage 4 chronic kidney disease, or unspecified chronic kidney disease: Secondary | ICD-10-CM | POA: Diagnosis not present

## 2021-05-14 DIAGNOSIS — J9610 Chronic respiratory failure, unspecified whether with hypoxia or hypercapnia: Secondary | ICD-10-CM | POA: Diagnosis not present

## 2021-05-14 DIAGNOSIS — Z87891 Personal history of nicotine dependence: Secondary | ICD-10-CM | POA: Diagnosis not present

## 2021-05-14 DIAGNOSIS — N1832 Chronic kidney disease, stage 3b: Secondary | ICD-10-CM | POA: Diagnosis not present

## 2021-05-14 DIAGNOSIS — E872 Acidosis: Secondary | ICD-10-CM | POA: Diagnosis not present

## 2021-05-14 DIAGNOSIS — I130 Hypertensive heart and chronic kidney disease with heart failure and stage 1 through stage 4 chronic kidney disease, or unspecified chronic kidney disease: Secondary | ICD-10-CM | POA: Diagnosis not present

## 2021-05-14 DIAGNOSIS — I5030 Unspecified diastolic (congestive) heart failure: Secondary | ICD-10-CM | POA: Diagnosis not present

## 2021-05-14 DIAGNOSIS — J9621 Acute and chronic respiratory failure with hypoxia: Secondary | ICD-10-CM | POA: Diagnosis not present

## 2021-05-21 DIAGNOSIS — R5381 Other malaise: Secondary | ICD-10-CM | POA: Diagnosis not present

## 2021-05-21 DIAGNOSIS — J9611 Chronic respiratory failure with hypoxia: Secondary | ICD-10-CM | POA: Diagnosis not present

## 2021-05-21 DIAGNOSIS — J9612 Chronic respiratory failure with hypercapnia: Secondary | ICD-10-CM | POA: Diagnosis not present

## 2021-05-21 DIAGNOSIS — J449 Chronic obstructive pulmonary disease, unspecified: Secondary | ICD-10-CM | POA: Diagnosis not present

## 2021-05-25 DIAGNOSIS — J90 Pleural effusion, not elsewhere classified: Secondary | ICD-10-CM | POA: Diagnosis not present

## 2021-05-25 DIAGNOSIS — I959 Hypotension, unspecified: Secondary | ICD-10-CM | POA: Diagnosis not present

## 2021-05-25 DIAGNOSIS — D649 Anemia, unspecified: Secondary | ICD-10-CM | POA: Diagnosis not present

## 2021-05-25 DIAGNOSIS — Z87891 Personal history of nicotine dependence: Secondary | ICD-10-CM | POA: Diagnosis not present

## 2021-05-25 DIAGNOSIS — R404 Transient alteration of awareness: Secondary | ICD-10-CM | POA: Diagnosis not present

## 2021-05-25 DIAGNOSIS — R944 Abnormal results of kidney function studies: Secondary | ICD-10-CM | POA: Diagnosis not present

## 2021-05-25 DIAGNOSIS — R0602 Shortness of breath: Secondary | ICD-10-CM | POA: Diagnosis not present

## 2021-05-25 DIAGNOSIS — J9811 Atelectasis: Secondary | ICD-10-CM | POA: Diagnosis not present

## 2021-05-26 DIAGNOSIS — J449 Chronic obstructive pulmonary disease, unspecified: Secondary | ICD-10-CM | POA: Diagnosis not present

## 2021-05-26 DIAGNOSIS — I10 Essential (primary) hypertension: Secondary | ICD-10-CM | POA: Diagnosis not present

## 2021-05-26 DIAGNOSIS — I5030 Unspecified diastolic (congestive) heart failure: Secondary | ICD-10-CM | POA: Diagnosis not present

## 2021-05-31 DIAGNOSIS — J9611 Chronic respiratory failure with hypoxia: Secondary | ICD-10-CM | POA: Diagnosis not present

## 2021-05-31 DIAGNOSIS — I5030 Unspecified diastolic (congestive) heart failure: Secondary | ICD-10-CM | POA: Diagnosis not present

## 2021-05-31 DIAGNOSIS — J449 Chronic obstructive pulmonary disease, unspecified: Secondary | ICD-10-CM | POA: Diagnosis not present

## 2021-05-31 DIAGNOSIS — J9612 Chronic respiratory failure with hypercapnia: Secondary | ICD-10-CM | POA: Diagnosis not present

## 2021-06-03 ENCOUNTER — Other Ambulatory Visit: Payer: Self-pay

## 2021-06-03 DIAGNOSIS — J9612 Chronic respiratory failure with hypercapnia: Secondary | ICD-10-CM | POA: Diagnosis not present

## 2021-06-03 DIAGNOSIS — J449 Chronic obstructive pulmonary disease, unspecified: Secondary | ICD-10-CM | POA: Diagnosis not present

## 2021-06-04 ENCOUNTER — Telehealth: Payer: Self-pay | Admitting: Family Medicine

## 2021-06-04 ENCOUNTER — Encounter: Payer: Medicare Other | Admitting: Family Medicine

## 2021-06-04 DIAGNOSIS — I959 Hypotension, unspecified: Secondary | ICD-10-CM | POA: Diagnosis not present

## 2021-06-04 DIAGNOSIS — R0902 Hypoxemia: Secondary | ICD-10-CM | POA: Diagnosis not present

## 2021-06-04 DIAGNOSIS — J9622 Acute and chronic respiratory failure with hypercapnia: Secondary | ICD-10-CM | POA: Diagnosis not present

## 2021-06-04 DIAGNOSIS — J9621 Acute and chronic respiratory failure with hypoxia: Secondary | ICD-10-CM | POA: Diagnosis not present

## 2021-06-04 DIAGNOSIS — J90 Pleural effusion, not elsewhere classified: Secondary | ICD-10-CM | POA: Diagnosis not present

## 2021-06-04 DIAGNOSIS — R000 Tachycardia, unspecified: Secondary | ICD-10-CM | POA: Diagnosis not present

## 2021-06-04 DIAGNOSIS — Z87891 Personal history of nicotine dependence: Secondary | ICD-10-CM | POA: Diagnosis not present

## 2021-06-04 DIAGNOSIS — R404 Transient alteration of awareness: Secondary | ICD-10-CM | POA: Diagnosis not present

## 2021-06-04 DIAGNOSIS — R0602 Shortness of breath: Secondary | ICD-10-CM | POA: Diagnosis not present

## 2021-06-04 DIAGNOSIS — N178 Other acute kidney failure: Secondary | ICD-10-CM | POA: Diagnosis not present

## 2021-06-04 NOTE — Telephone Encounter (Signed)
Called patients grandson, Weston Brass at 1:00 pm and was advised that they did call the EMS and she is going to [facility] to be evaluated.  Her appointment was canceled. Dm/cma

## 2021-06-04 NOTE — Telephone Encounter (Signed)
Tried to call home health nurse back and was unable to leave VM due to VM was full.   Called patients' # and spoke to her grandson, Nicolas's wife.  She states that patient refuses to wear the bi-pap at night and has signed a DNR. She is lethargic, non-coherent, and they are unable to get her out of the home.  Advised that she needs to be evaluated either her at our office or at the hospital (by ambulance) in order to be able to do a hospice order.  They will keep the 1 pm appointment so Provider can discuss this with Beth Kerr.  Dm/cma

## 2021-06-10 NOTE — Progress Notes (Signed)
This encounter was created in error - please disregard.

## 2021-06-29 DEATH — deceased

## 2021-06-30 ENCOUNTER — Ambulatory Visit: Payer: Medicare Other | Admitting: Podiatry

## 2021-08-19 ENCOUNTER — Telehealth: Payer: Self-pay | Admitting: Family Medicine

## 2021-08-19 NOTE — Telephone Encounter (Signed)
I called patient to schedule AWV. A lady answered and said patient passed away the second week in October. Please change patient's status to deceased.

## 2021-09-23 NOTE — Telephone Encounter (Signed)
Per [facility] updated date of death to June 10, 2021  Beneficiary: Beth Kerr Medicare ID: [id number] Gender: Female DOB: 1940-11-17 DOD: 06-10-21

## 2021-09-28 ENCOUNTER — Other Ambulatory Visit: Payer: Medicare Other

## 2021-11-15 ENCOUNTER — Ambulatory Visit: Payer: Medicare Other | Admitting: Neurology

## 2021-11-30 ENCOUNTER — Ambulatory Visit: Payer: Medicare Other | Admitting: Neurology
